# Patient Record
Sex: Male | Born: 1935 | Race: White | Hispanic: No | Marital: Married | Smoking: Never smoker
Health system: Southern US, Community
[De-identification: ages and names within clinical notes are randomized; demographics above are authoritative.]

## PROBLEM LIST (undated history)

## (undated) DIAGNOSIS — R600 Localized edema: Secondary | ICD-10-CM

## (undated) DIAGNOSIS — I2510 Atherosclerotic heart disease of native coronary artery without angina pectoris: Secondary | ICD-10-CM

## (undated) DIAGNOSIS — I714 Abdominal aortic aneurysm, without rupture, unspecified: Secondary | ICD-10-CM

## (undated) DIAGNOSIS — R609 Edema, unspecified: Secondary | ICD-10-CM

## (undated) DIAGNOSIS — N183 Chronic kidney disease, stage 3 unspecified: Secondary | ICD-10-CM

## (undated) DIAGNOSIS — J9600 Acute respiratory failure, unspecified whether with hypoxia or hypercapnia: Secondary | ICD-10-CM

## (undated) DIAGNOSIS — I509 Heart failure, unspecified: Secondary | ICD-10-CM

## (undated) DIAGNOSIS — I442 Atrioventricular block, complete: Secondary | ICD-10-CM

## (undated) DIAGNOSIS — E119 Type 2 diabetes mellitus without complications: Secondary | ICD-10-CM

## (undated) DIAGNOSIS — B029 Zoster without complications: Secondary | ICD-10-CM

## (undated) DIAGNOSIS — I639 Cerebral infarction, unspecified: Secondary | ICD-10-CM

## (undated) DIAGNOSIS — I10 Essential (primary) hypertension: Secondary | ICD-10-CM

## (undated) DIAGNOSIS — Z950 Presence of cardiac pacemaker: Secondary | ICD-10-CM

## (undated) DIAGNOSIS — D649 Anemia, unspecified: Secondary | ICD-10-CM

## (undated) DIAGNOSIS — I219 Acute myocardial infarction, unspecified: Secondary | ICD-10-CM

## (undated) DIAGNOSIS — K219 Gastro-esophageal reflux disease without esophagitis: Secondary | ICD-10-CM

## (undated) DIAGNOSIS — J449 Chronic obstructive pulmonary disease, unspecified: Secondary | ICD-10-CM

## (undated) DIAGNOSIS — M1990 Unspecified osteoarthritis, unspecified site: Secondary | ICD-10-CM

## (undated) DIAGNOSIS — I4891 Unspecified atrial fibrillation: Secondary | ICD-10-CM

## (undated) DIAGNOSIS — J90 Pleural effusion, not elsewhere classified: Secondary | ICD-10-CM

## (undated) DIAGNOSIS — R531 Weakness: Secondary | ICD-10-CM

## (undated) HISTORY — DX: Abdominal aortic aneurysm, without rupture, unspecified: I71.40

## (undated) HISTORY — PX: HERNIA REPAIR: SHX51

## (undated) HISTORY — DX: Abdominal aortic aneurysm, without rupture: I71.4

## (undated) HISTORY — PX: EYE SURGERY: SHX253

---

## 2001-09-08 ENCOUNTER — Ambulatory Visit (HOSPITAL_COMMUNITY): Admission: RE | Admit: 2001-09-08 | Discharge: 2001-09-08 | Payer: Self-pay | Admitting: General Surgery

## 2001-11-16 ENCOUNTER — Ambulatory Visit (HOSPITAL_COMMUNITY): Admission: RE | Admit: 2001-11-16 | Discharge: 2001-11-16 | Payer: Self-pay | Admitting: Family Medicine

## 2001-11-16 ENCOUNTER — Encounter: Payer: Self-pay | Admitting: Family Medicine

## 2003-10-06 ENCOUNTER — Ambulatory Visit (HOSPITAL_COMMUNITY): Admission: RE | Admit: 2003-10-06 | Discharge: 2003-10-06 | Payer: Self-pay | Admitting: General Surgery

## 2004-05-17 ENCOUNTER — Emergency Department (HOSPITAL_COMMUNITY): Admission: EM | Admit: 2004-05-17 | Discharge: 2004-05-17 | Payer: Self-pay | Admitting: Emergency Medicine

## 2004-05-17 ENCOUNTER — Encounter: Payer: Self-pay | Admitting: Cardiovascular Disease

## 2004-05-17 ENCOUNTER — Inpatient Hospital Stay (HOSPITAL_COMMUNITY): Admission: EM | Admit: 2004-05-17 | Discharge: 2004-05-22 | Payer: Self-pay | Admitting: Neurology

## 2004-05-22 ENCOUNTER — Inpatient Hospital Stay (HOSPITAL_COMMUNITY)
Admission: RE | Admit: 2004-05-22 | Discharge: 2004-06-07 | Payer: Self-pay | Admitting: Physical Medicine & Rehabilitation

## 2004-07-12 ENCOUNTER — Encounter (HOSPITAL_COMMUNITY): Admission: RE | Admit: 2004-07-12 | Discharge: 2004-07-26 | Payer: Self-pay | Admitting: Family Medicine

## 2004-07-30 ENCOUNTER — Encounter (HOSPITAL_COMMUNITY): Admission: RE | Admit: 2004-07-30 | Discharge: 2004-08-29 | Payer: Self-pay | Admitting: Family Medicine

## 2004-10-02 ENCOUNTER — Ambulatory Visit (HOSPITAL_COMMUNITY): Admission: RE | Admit: 2004-10-02 | Discharge: 2004-10-02 | Payer: Self-pay | Admitting: Family Medicine

## 2004-10-03 ENCOUNTER — Ambulatory Visit (HOSPITAL_COMMUNITY): Admission: RE | Admit: 2004-10-03 | Discharge: 2004-10-03 | Payer: Self-pay | Admitting: Family Medicine

## 2004-10-04 ENCOUNTER — Ambulatory Visit (HOSPITAL_COMMUNITY): Admission: RE | Admit: 2004-10-04 | Discharge: 2004-10-04 | Payer: Self-pay | Admitting: Family Medicine

## 2004-12-24 ENCOUNTER — Ambulatory Visit (HOSPITAL_COMMUNITY): Admission: RE | Admit: 2004-12-24 | Discharge: 2004-12-24 | Payer: Self-pay | Admitting: Family Medicine

## 2005-01-07 ENCOUNTER — Ambulatory Visit: Payer: Self-pay | Admitting: Cardiovascular Disease

## 2005-01-08 ENCOUNTER — Ambulatory Visit: Payer: Self-pay | Admitting: Cardiovascular Disease

## 2005-01-08 ENCOUNTER — Encounter (HOSPITAL_COMMUNITY): Admission: RE | Admit: 2005-01-08 | Discharge: 2005-02-07 | Payer: Self-pay | Admitting: Cardiovascular Disease

## 2005-01-22 ENCOUNTER — Ambulatory Visit (HOSPITAL_COMMUNITY): Admission: RE | Admit: 2005-01-22 | Discharge: 2005-01-22 | Payer: Self-pay | Admitting: Family Medicine

## 2005-04-23 ENCOUNTER — Ambulatory Visit (HOSPITAL_COMMUNITY): Admission: RE | Admit: 2005-04-23 | Discharge: 2005-04-23 | Payer: Self-pay | Admitting: Family Medicine

## 2005-09-04 ENCOUNTER — Ambulatory Visit: Payer: Self-pay | Admitting: Internal Medicine

## 2005-09-24 ENCOUNTER — Ambulatory Visit: Payer: Self-pay | Admitting: Internal Medicine

## 2005-09-24 ENCOUNTER — Ambulatory Visit (HOSPITAL_COMMUNITY): Admission: RE | Admit: 2005-09-24 | Discharge: 2005-09-24 | Payer: Self-pay | Admitting: Internal Medicine

## 2005-10-30 ENCOUNTER — Ambulatory Visit: Payer: Self-pay | Admitting: Cardiology

## 2005-11-13 ENCOUNTER — Ambulatory Visit: Payer: Self-pay | Admitting: Internal Medicine

## 2006-02-09 ENCOUNTER — Ambulatory Visit: Payer: Self-pay | Admitting: Internal Medicine

## 2006-07-20 ENCOUNTER — Ambulatory Visit (HOSPITAL_COMMUNITY): Admission: RE | Admit: 2006-07-20 | Discharge: 2006-07-20 | Payer: Self-pay | Admitting: Family Medicine

## 2006-08-19 ENCOUNTER — Ambulatory Visit: Payer: Self-pay | Admitting: Internal Medicine

## 2007-01-20 ENCOUNTER — Ambulatory Visit: Payer: Self-pay | Admitting: Internal Medicine

## 2007-06-27 ENCOUNTER — Emergency Department (HOSPITAL_COMMUNITY): Admission: EM | Admit: 2007-06-27 | Discharge: 2007-06-27 | Payer: Self-pay | Admitting: Emergency Medicine

## 2007-11-03 ENCOUNTER — Inpatient Hospital Stay (HOSPITAL_COMMUNITY): Admission: EM | Admit: 2007-11-03 | Discharge: 2007-11-06 | Payer: Self-pay | Admitting: Emergency Medicine

## 2007-11-03 ENCOUNTER — Encounter (INDEPENDENT_AMBULATORY_CARE_PROVIDER_SITE_OTHER): Payer: Self-pay | Admitting: Family Medicine

## 2007-11-30 ENCOUNTER — Encounter (HOSPITAL_COMMUNITY): Admission: RE | Admit: 2007-11-30 | Discharge: 2007-12-30 | Payer: Self-pay | Admitting: Family Medicine

## 2008-01-03 ENCOUNTER — Encounter (HOSPITAL_COMMUNITY): Admission: RE | Admit: 2008-01-03 | Discharge: 2008-02-02 | Payer: Self-pay | Admitting: Family Medicine

## 2008-10-27 DIAGNOSIS — I219 Acute myocardial infarction, unspecified: Secondary | ICD-10-CM

## 2008-10-27 HISTORY — DX: Acute myocardial infarction, unspecified: I21.9

## 2009-04-09 ENCOUNTER — Encounter (HOSPITAL_COMMUNITY): Admission: RE | Admit: 2009-04-09 | Discharge: 2009-05-09 | Payer: Self-pay | Admitting: Internal Medicine

## 2009-04-30 ENCOUNTER — Ambulatory Visit: Payer: Self-pay | Admitting: Internal Medicine

## 2009-04-30 ENCOUNTER — Inpatient Hospital Stay (HOSPITAL_COMMUNITY): Admission: EM | Admit: 2009-04-30 | Discharge: 2009-05-15 | Payer: Self-pay | Admitting: Emergency Medicine

## 2009-05-01 ENCOUNTER — Encounter: Payer: Self-pay | Admitting: Internal Medicine

## 2009-05-08 DIAGNOSIS — Z950 Presence of cardiac pacemaker: Secondary | ICD-10-CM

## 2009-05-08 HISTORY — DX: Presence of cardiac pacemaker: Z95.0

## 2009-05-08 HISTORY — PX: PERMANENT PACEMAKER INSERTION: SHX6023

## 2009-05-15 ENCOUNTER — Inpatient Hospital Stay: Admission: RE | Admit: 2009-05-15 | Discharge: 2009-05-29 | Payer: Self-pay | Admitting: Internal Medicine

## 2009-06-13 HISTORY — PX: NM MYOCAR PERF WALL MOTION: HXRAD629

## 2010-01-28 ENCOUNTER — Ambulatory Visit (HOSPITAL_COMMUNITY): Admission: RE | Admit: 2010-01-28 | Discharge: 2010-01-28 | Payer: Self-pay | Admitting: Family Medicine

## 2010-01-31 ENCOUNTER — Ambulatory Visit (HOSPITAL_COMMUNITY): Admission: RE | Admit: 2010-01-31 | Discharge: 2010-01-31 | Payer: Self-pay | Admitting: Family Medicine

## 2010-05-14 ENCOUNTER — Ambulatory Visit (HOSPITAL_COMMUNITY): Admission: RE | Admit: 2010-05-14 | Discharge: 2010-05-14 | Payer: Self-pay | Admitting: General Surgery

## 2010-11-17 ENCOUNTER — Encounter: Payer: Self-pay | Admitting: Family Medicine

## 2010-11-17 ENCOUNTER — Encounter: Payer: Self-pay | Admitting: Physical Medicine & Rehabilitation

## 2011-01-27 ENCOUNTER — Emergency Department (HOSPITAL_COMMUNITY)
Admission: EM | Admit: 2011-01-27 | Discharge: 2011-01-27 | Disposition: A | Discharge status: Home / Self Care | Payer: Medicare Other | Attending: Emergency Medicine | Admitting: Emergency Medicine

## 2011-01-27 DIAGNOSIS — Z79899 Other long term (current) drug therapy: Secondary | ICD-10-CM | POA: Insufficient documentation

## 2011-01-27 DIAGNOSIS — Z8673 Personal history of transient ischemic attack (TIA), and cerebral infarction without residual deficits: Secondary | ICD-10-CM | POA: Insufficient documentation

## 2011-01-27 DIAGNOSIS — I10 Essential (primary) hypertension: Secondary | ICD-10-CM | POA: Insufficient documentation

## 2011-01-27 DIAGNOSIS — Z950 Presence of cardiac pacemaker: Secondary | ICD-10-CM | POA: Insufficient documentation

## 2011-01-27 DIAGNOSIS — K219 Gastro-esophageal reflux disease without esophagitis: Secondary | ICD-10-CM | POA: Insufficient documentation

## 2011-01-27 DIAGNOSIS — Z7982 Long term (current) use of aspirin: Secondary | ICD-10-CM | POA: Insufficient documentation

## 2011-01-27 DIAGNOSIS — I252 Old myocardial infarction: Secondary | ICD-10-CM | POA: Insufficient documentation

## 2011-02-02 LAB — COMPREHENSIVE METABOLIC PANEL
ALT: 16 U/L (ref 0–53)
ALT: 17 U/L (ref 0–53)
ALT: 20 U/L (ref 0–53)
ALT: 27 U/L (ref 0–53)
AST: 25 U/L (ref 0–37)
AST: 23 U/L (ref 0–37)
AST: 23 U/L (ref 0–37)
AST: 28 U/L (ref 0–37)
AST: 35 U/L (ref 0–37)
Albumin: 2.5 g/dL — ABNORMAL LOW (ref 3.5–5.2)
Albumin: 2.5 g/dL — ABNORMAL LOW (ref 3.5–5.2)
Albumin: 2.6 g/dL — ABNORMAL LOW (ref 3.5–5.2)
Alkaline Phosphatase: 66 U/L (ref 39–117)
Alkaline Phosphatase: 67 U/L (ref 39–117)
Alkaline Phosphatase: 76 U/L (ref 39–117)
BUN: 50 mg/dL — ABNORMAL HIGH (ref 6–23)
BUN: 63 mg/dL — ABNORMAL HIGH (ref 6–23)
BUN: 76 mg/dL — ABNORMAL HIGH (ref 6–23)
BUN: 93 mg/dL — ABNORMAL HIGH (ref 6–23)
CO2: 28 mEq/L (ref 19–32)
CO2: 23 mEq/L (ref 19–32)
CO2: 23 mEq/L (ref 19–32)
CO2: 26 mEq/L (ref 19–32)
Calcium: 8.1 mg/dL — ABNORMAL LOW (ref 8.4–10.5)
Calcium: 8.2 mg/dL — ABNORMAL LOW (ref 8.4–10.5)
Calcium: 8.3 mg/dL — ABNORMAL LOW (ref 8.4–10.5)
Calcium: 8.8 mg/dL (ref 8.4–10.5)
Chloride: 101 mEq/L (ref 96–112)
Chloride: 103 mEq/L (ref 96–112)
Chloride: 96 mEq/L (ref 96–112)
Chloride: 97 mEq/L (ref 96–112)
Creatinine, Ser: 1.8 mg/dL — ABNORMAL HIGH (ref 0.4–1.5)
Creatinine, Ser: 2.29 mg/dL — ABNORMAL HIGH (ref 0.4–1.5)
Creatinine, Ser: 3.18 mg/dL — ABNORMAL HIGH (ref 0.4–1.5)
Creatinine, Ser: 3.45 mg/dL — ABNORMAL HIGH (ref 0.4–1.5)
Creatinine, Ser: 3.62 mg/dL — ABNORMAL HIGH (ref 0.4–1.5)
GFR calc Af Amer: 19 mL/min — ABNORMAL LOW (ref >60)
GFR calc Af Amer: 20 mL/min — ABNORMAL LOW (ref >60)
GFR calc Af Amer: 22 mL/min — ABNORMAL LOW (ref >60)
GFR calc Af Amer: 33 mL/min — ABNORMAL LOW (ref >60)
GFR calc non Af Amer: 36 mL/min — ABNORMAL LOW (ref >60)
GFR calc non Af Amer: 16 mL/min — ABNORMAL LOW (ref >60)
GFR calc non Af Amer: 17 mL/min — ABNORMAL LOW (ref >60)
GFR calc non Af Amer: 19 mL/min — ABNORMAL LOW (ref >60)
Glucose, Bld: 116 mg/dL — ABNORMAL HIGH (ref 70–99)
Glucose, Bld: 119 mg/dL — ABNORMAL HIGH (ref 70–99)
Glucose, Bld: 130 mg/dL — ABNORMAL HIGH (ref 70–99)
Potassium: 3.7 mEq/L (ref 3.5–5.1)
Potassium: 3.9 mEq/L (ref 3.5–5.1)
Potassium: 4 mEq/L (ref 3.5–5.1)
Sodium: 132 mEq/L — ABNORMAL LOW (ref 135–145)
Sodium: 135 mEq/L (ref 135–145)
Sodium: 136 mEq/L (ref 135–145)
Sodium: 136 mEq/L (ref 135–145)
Total Bilirubin: 0.2 mg/dL — ABNORMAL LOW (ref 0.3–1.2)
Total Bilirubin: 0.7 mg/dL (ref 0.3–1.2)
Total Bilirubin: 0.9 mg/dL (ref 0.3–1.2)
Total Bilirubin: 1 mg/dL (ref 0.3–1.2)
Total Protein: 5.9 g/dL — ABNORMAL LOW (ref 6.0–8.3)
Total Protein: 6 g/dL (ref 6.0–8.3)
Total Protein: 6 g/dL (ref 6.0–8.3)
Total Protein: 6.4 g/dL (ref 6.0–8.3)

## 2011-02-02 LAB — UIFE/LIGHT CHAINS/TP QN, 24-HR UR
Albumin, U: DETECTED
Free Kappa Lt Chains,Ur: 2.09 mg/dL — ABNORMAL HIGH (ref 0.04–1.51)
Free Lambda Excretion/Day: 6.63 mg/d
Free Lambda Lt Chains,Ur: 0.34 mg/dL (ref 0.08–1.01)
Gamma Globulin, Urine: DETECTED — AB
Time: 24 hours
Total Protein, Urine-Ur/day: 72 mg/d (ref 10–140)
Volume, Urine: 1950 mL

## 2011-02-02 LAB — CBC
HCT: 31 % — ABNORMAL LOW (ref 39.0–52.0)
HCT: 29.9 % — ABNORMAL LOW (ref 39.0–52.0)
HCT: 31.6 % — ABNORMAL LOW (ref 39.0–52.0)
HCT: 32.7 % — ABNORMAL LOW (ref 39.0–52.0)
HCT: 33 % — ABNORMAL LOW (ref 39.0–52.0)
HCT: 33.1 % — ABNORMAL LOW (ref 39.0–52.0)
HCT: 34.3 % — ABNORMAL LOW (ref 39.0–52.0)
HCT: 34.3 % — ABNORMAL LOW (ref 39.0–52.0)
HCT: 36.1 % — ABNORMAL LOW (ref 39.0–52.0)
Hemoglobin: 10.5 g/dL — ABNORMAL LOW (ref 13.0–17.0)
Hemoglobin: 10.8 g/dL — ABNORMAL LOW (ref 13.0–17.0)
Hemoglobin: 11.1 g/dL — ABNORMAL LOW (ref 13.0–17.0)
Hemoglobin: 11.2 g/dL — ABNORMAL LOW (ref 13.0–17.0)
Hemoglobin: 11.4 g/dL — ABNORMAL LOW (ref 13.0–17.0)
Hemoglobin: 11.7 g/dL — ABNORMAL LOW (ref 13.0–17.0)
Hemoglobin: 11.8 g/dL — ABNORMAL LOW (ref 13.0–17.0)
Hemoglobin: 12.1 g/dL — ABNORMAL LOW (ref 13.0–17.0)
Hemoglobin: 12.3 g/dL — ABNORMAL LOW (ref 13.0–17.0)
MCHC: 33.8 g/dL (ref 30.0–36.0)
MCHC: 34.1 g/dL (ref 30.0–36.0)
MCHC: 34.2 g/dL (ref 30.0–36.0)
MCHC: 34.3 g/dL (ref 30.0–36.0)
MCHC: 34.4 g/dL (ref 30.0–36.0)
MCHC: 34.5 g/dL (ref 30.0–36.0)
MCV: 93.9 fL (ref 78.0–100.0)
MCV: 92.5 fL (ref 78.0–100.0)
MCV: 92.9 fL (ref 78.0–100.0)
MCV: 92.9 fL (ref 78.0–100.0)
MCV: 92.9 fL (ref 78.0–100.0)
MCV: 93.1 fL (ref 78.0–100.0)
MCV: 93.4 fL (ref 78.0–100.0)
MCV: 93.5 fL (ref 78.0–100.0)
Platelets: 162 10*3/uL (ref 150–400)
Platelets: 163 10*3/uL (ref 150–400)
Platelets: 181 10*3/uL (ref 150–400)
Platelets: 183 10*3/uL (ref 150–400)
Platelets: 199 10*3/uL (ref 150–400)
Platelets: 206 10*3/uL (ref 150–400)
Platelets: 219 10*3/uL (ref 150–400)
RBC: 3.3 MIL/uL — ABNORMAL LOW (ref 4.22–5.81)
RBC: 3.39 MIL/uL — ABNORMAL LOW (ref 4.22–5.81)
RBC: 3.51 MIL/uL — ABNORMAL LOW (ref 4.22–5.81)
RBC: 3.55 MIL/uL — ABNORMAL LOW (ref 4.22–5.81)
RBC: 3.56 MIL/uL — ABNORMAL LOW (ref 4.22–5.81)
RBC: 3.67 MIL/uL — ABNORMAL LOW (ref 4.22–5.81)
RBC: 3.68 MIL/uL — ABNORMAL LOW (ref 4.22–5.81)
RBC: 3.9 MIL/uL — ABNORMAL LOW (ref 4.22–5.81)
RDW: 13.2 % (ref 11.5–15.5)
RDW: 13.4 % (ref 11.5–15.5)
RDW: 13.4 % (ref 11.5–15.5)
RDW: 13.5 % (ref 11.5–15.5)
RDW: 13.6 % (ref 11.5–15.5)
RDW: 13.6 % (ref 11.5–15.5)
RDW: 13.7 % (ref 11.5–15.5)
WBC: 7 10*3/uL (ref 4.0–10.5)
WBC: 13.7 10*3/uL — ABNORMAL HIGH (ref 4.0–10.5)
WBC: 6.8 10*3/uL (ref 4.0–10.5)
WBC: 7.3 10*3/uL (ref 4.0–10.5)
WBC: 7.8 10*3/uL (ref 4.0–10.5)
WBC: 8.3 10*3/uL (ref 4.0–10.5)
WBC: 8.4 10*3/uL (ref 4.0–10.5)
WBC: 8.4 10*3/uL (ref 4.0–10.5)
WBC: 9.6 10*3/uL (ref 4.0–10.5)

## 2011-02-02 LAB — DIFFERENTIAL
Basophils Relative: 0 % (ref 0–1)
Eosinophils Absolute: 0.1 10*3/uL (ref 0.0–0.7)
Eosinophils Relative: 1 % (ref 0–5)
Monocytes Absolute: 1.1 10*3/uL — ABNORMAL HIGH (ref 0.1–1.0)
Monocytes Relative: 16 % — ABNORMAL HIGH (ref 3–12)
Neutrophils Relative %: 63 % (ref 43–77)

## 2011-02-02 LAB — HEPARIN LEVEL (UNFRACTIONATED)
Heparin Unfractionated: 0.13 IU/mL — ABNORMAL LOW (ref 0.30–0.70)
Heparin Unfractionated: 0.31 IU/mL (ref 0.30–0.70)
Heparin Unfractionated: 0.43 IU/mL (ref 0.30–0.70)
Heparin Unfractionated: 0.43 IU/mL (ref 0.30–0.70)
Heparin Unfractionated: 0.47 IU/mL (ref 0.30–0.70)
Heparin Unfractionated: <0.1 IU/mL — ABNORMAL LOW (ref 0.30–0.70)

## 2011-02-02 LAB — RENAL FUNCTION PANEL
Albumin: 2.7 g/dL — ABNORMAL LOW (ref 3.5–5.2)
BUN: 94 mg/dL — ABNORMAL HIGH (ref 6–23)
CO2: 29 mEq/L (ref 19–32)
CO2: 30 mEq/L (ref 19–32)
CO2: 30 mEq/L (ref 19–32)
CO2: 31 mEq/L (ref 19–32)
Calcium: 8.9 mg/dL (ref 8.4–10.5)
Calcium: 9 mg/dL (ref 8.4–10.5)
Calcium: 9.1 mg/dL (ref 8.4–10.5)
Chloride: 102 mEq/L (ref 96–112)
Chloride: 91 mEq/L — ABNORMAL LOW (ref 96–112)
Chloride: 92 mEq/L — ABNORMAL LOW (ref 96–112)
Chloride: 92 mEq/L — ABNORMAL LOW (ref 96–112)
Creatinine, Ser: 2.46 mg/dL — ABNORMAL HIGH (ref 0.4–1.5)
GFR calc Af Amer: 27 mL/min — ABNORMAL LOW (ref >60)
GFR calc Af Amer: 36 mL/min — ABNORMAL LOW (ref >60)
GFR calc non Af Amer: 23 mL/min — ABNORMAL LOW (ref >60)
GFR calc non Af Amer: 30 mL/min — ABNORMAL LOW (ref >60)
Glucose, Bld: 119 mg/dL — ABNORMAL HIGH (ref 70–99)
Glucose, Bld: 123 mg/dL — ABNORMAL HIGH (ref 70–99)
Glucose, Bld: 127 mg/dL — ABNORMAL HIGH (ref 70–99)
Glucose, Bld: 128 mg/dL — ABNORMAL HIGH (ref 70–99)
Phosphorus: 6.3 mg/dL — ABNORMAL HIGH (ref 2.3–4.6)
Potassium: 3.6 mEq/L (ref 3.5–5.1)
Potassium: 3.6 mEq/L (ref 3.5–5.1)
Potassium: 3.9 mEq/L (ref 3.5–5.1)
Potassium: 4.1 mEq/L (ref 3.5–5.1)
Sodium: 137 mEq/L (ref 135–145)
Sodium: 137 mEq/L (ref 135–145)
Sodium: 138 mEq/L (ref 135–145)
Sodium: 139 mEq/L (ref 135–145)

## 2011-02-02 LAB — GLUCOSE, CAPILLARY
Glucose-Capillary: 114 mg/dL — ABNORMAL HIGH (ref 70–99)
Glucose-Capillary: 121 mg/dL — ABNORMAL HIGH (ref 70–99)
Glucose-Capillary: 125 mg/dL — ABNORMAL HIGH (ref 70–99)
Glucose-Capillary: 130 mg/dL — ABNORMAL HIGH (ref 70–99)
Glucose-Capillary: 132 mg/dL — ABNORMAL HIGH (ref 70–99)
Glucose-Capillary: 135 mg/dL — ABNORMAL HIGH (ref 70–99)
Glucose-Capillary: 135 mg/dL — ABNORMAL HIGH (ref 70–99)
Glucose-Capillary: 137 mg/dL — ABNORMAL HIGH (ref 70–99)
Glucose-Capillary: 160 mg/dL — ABNORMAL HIGH (ref 70–99)

## 2011-02-02 LAB — PROTIME-INR
INR: 1.1 (ref 0.00–1.49)
Prothrombin Time: 14.6 seconds (ref 11.6–15.2)

## 2011-02-02 LAB — URINALYSIS, ROUTINE W REFLEX MICROSCOPIC
Bilirubin Urine: NEGATIVE
Ketones, ur: NEGATIVE mg/dL
Nitrite: NEGATIVE
Protein, ur: NEGATIVE mg/dL
Urobilinogen, UA: 0.2 mg/dL (ref 0.0–1.0)

## 2011-02-02 LAB — TROPONIN I: Troponin I: 6.17 ng/mL (ref 0.00–0.06)

## 2011-02-02 LAB — POCT CARDIAC MARKERS
CKMB, poc: 1.4 ng/mL (ref 1.0–8.0)
Myoglobin, poc: 149 ng/mL (ref 12–200)
Myoglobin, poc: 191 ng/mL (ref 12–200)
Troponin i, poc: <0.05 ng/mL (ref 0.00–0.09)
Troponin i, poc: <0.05 ng/mL (ref 0.00–0.09)

## 2011-02-02 LAB — BLOOD GAS, ARTERIAL
Drawn by: 24610
FIO2: 0.4 %
pCO2 arterial: 34.3 mmHg — ABNORMAL LOW (ref 35.0–45.0)
pH, Arterial: 7.393 (ref 7.350–7.450)
pO2, Arterial: 57 mmHg — ABNORMAL LOW (ref 80.0–100.0)

## 2011-02-02 LAB — BASIC METABOLIC PANEL
BUN: 45 mg/dL — ABNORMAL HIGH (ref 6–23)
BUN: 53 mg/dL — ABNORMAL HIGH (ref 6–23)
CO2: 27 mEq/L (ref 19–32)
CO2: 28 mEq/L (ref 19–32)
Calcium: 8.2 mg/dL — ABNORMAL LOW (ref 8.4–10.5)
Calcium: 8.7 mg/dL (ref 8.4–10.5)
Chloride: 100 mEq/L (ref 96–112)
Creatinine, Ser: 2.32 mg/dL — ABNORMAL HIGH (ref 0.4–1.5)
Creatinine, Ser: 2.65 mg/dL — ABNORMAL HIGH (ref 0.4–1.5)
GFR calc Af Amer: 28 mL/min — ABNORMAL LOW (ref >60)
GFR calc Af Amer: 32 mL/min — ABNORMAL LOW (ref >60)
GFR calc non Af Amer: 20 mL/min — ABNORMAL LOW (ref >60)
Glucose, Bld: 155 mg/dL — ABNORMAL HIGH (ref 70–99)
Glucose, Bld: 164 mg/dL — ABNORMAL HIGH (ref 70–99)
Potassium: 4.3 mEq/L (ref 3.5–5.1)
Sodium: 135 mEq/L (ref 135–145)

## 2011-02-02 LAB — CARDIAC PANEL(CRET KIN+CKTOT+MB+TROPI)
CK, MB: 39 ng/mL — ABNORMAL HIGH (ref 0.3–4.0)
CK, MB: 7.7 ng/mL — ABNORMAL HIGH (ref 0.3–4.0)
CK, MB: 8 ng/mL — ABNORMAL HIGH (ref 0.3–4.0)
Relative Index: 4.5 — ABNORMAL HIGH (ref 0.0–2.5)
Relative Index: 9.6 — ABNORMAL HIGH (ref 0.0–2.5)
Relative Index: INVALID (ref 0.0–2.5)
Total CK: 146 U/L (ref 7–232)
Total CK: 164 U/L (ref 7–232)
Total CK: 406 U/L — ABNORMAL HIGH (ref 7–232)
Troponin I: 13.27 ng/mL (ref 0.00–0.06)
Troponin I: 2.13 ng/mL (ref 0.00–0.06)
Troponin I: 2.2 ng/mL (ref 0.00–0.06)

## 2011-02-02 LAB — PROTEIN ELECTROPHORESIS, SERUM
Albumin ELP: 45.8 % — ABNORMAL LOW (ref 55.8–66.1)
Alpha-1-Globulin: 10 % — ABNORMAL HIGH (ref 2.9–4.9)
Alpha-2-Globulin: 16.3 % — ABNORMAL HIGH (ref 7.1–11.8)
Beta 2: 8.5 % — ABNORMAL HIGH (ref 3.2–6.5)
Beta Globulin: 7.3 % — ABNORMAL HIGH (ref 4.7–7.2)
Gamma Globulin: 12.1 % (ref 11.1–18.8)
M-Spike, %: NOT DETECTED g/dL
Total Protein ELP: 5.6 g/dL — ABNORMAL LOW (ref 6.0–8.3)

## 2011-02-02 LAB — CK TOTAL AND CKMB (NOT AT ARMC)
CK, MB: 26.7 ng/mL — ABNORMAL HIGH (ref 0.3–4.0)
Relative Index: 10.1 — ABNORMAL HIGH (ref 0.0–2.5)
Relative Index: INVALID (ref 0.0–2.5)
Total CK: 265 U/L — ABNORMAL HIGH (ref 7–232)
Total CK: 53 U/L (ref 7–232)

## 2011-02-02 LAB — BRAIN NATRIURETIC PEPTIDE
Pro B Natriuretic peptide (BNP): 1069 pg/mL — ABNORMAL HIGH (ref 0.0–100.0)
Pro B Natriuretic peptide (BNP): 1477 pg/mL — ABNORMAL HIGH (ref 0.0–100.0)
Pro B Natriuretic peptide (BNP): 2041 pg/mL — ABNORMAL HIGH (ref 0.0–100.0)
Pro B Natriuretic peptide (BNP): 2533 pg/mL — ABNORMAL HIGH (ref 0.0–100.0)
Pro B Natriuretic peptide (BNP): 744 pg/mL — ABNORMAL HIGH (ref 0.0–100.0)

## 2011-02-02 LAB — MAGNESIUM: Magnesium: 2.1 mg/dL (ref 1.5–2.5)

## 2011-03-11 NOTE — Discharge Summary (Signed)
NAME:  [name], [name] NO.:  [id number]   MEDICAL RECORD NO.:  [id number]          PATIENT TYPE:  INP   LOCATION:  3708                         FACILITY:  [facility]   PHYSICIAN:  Ritta Slot, MD     DATE OF BIRTH:  1919-05-05   DATE OF ADMISSION:  04/30/2009  DATE OF DISCHARGE:  05/15/2009                               DISCHARGE SUMMARY   ADDENDUM:  He had a non ST-elevation myocardial infarction.  He was  medically treated secondary to his acute on chronic kidney failure.  He  was also not placed on an ACE or an ARB secondary to his elevated  creatinine.      Lezlie Octave, N.P.      Ritta Slot, MD  Electronically Signed    BB/MEDQ  D:  05/15/2009  T:  05/16/2009  Job:  [id number]   cc:   Madelin Rear. Sherwood Gambler, MD

## 2011-03-11 NOTE — Discharge Summary (Signed)
NAME:  [name], [name] NO.:  [id number]   MEDICAL RECORD NO.:  [id number]          PATIENT TYPE:  INP   LOCATION:  A226                          FACILITY:  [facility]   PHYSICIAN:  Skeet Latch, DO    DATE OF BIRTH:  05/27/1919   DATE OF ADMISSION:  11/03/2007  DATE OF DISCHARGE:  01/10/2009LH                               DISCHARGE SUMMARY   DISCHARGE DIAGNOSES:  1. Acute nonhemorrhagic right pontine infarct.  2. Acute renal insufficiency.  3. Asymptomatic left vertebral stenosis.  4. Hypertension.  5. Episodes of bradycardia.   BRIEF HOSPITAL COURSE:  [name] is an 77 year old Caucasian male who  presented with weakness.  The patient stated approximately 2 days prior  to admission he started experiencing stroke like symptoms.  The patient  started to notice that his left side upper and lower started to become  weaker than normal.  The patient did have previous strokes in the past  and has had some mild weakness on his left side.  The patient described  the weakness as a heaviness type feeling.  No major slurring of speech  or altered mental status changes were noted.  The patient did admit to  some ataxia and decreased grasp function of his left hand during this  episode.  These symptoms seemed to slowly improve since the patient has  been in the hospital.  The patient did not experience any major deficits  on the left side.  He has been doing fairly well.  The patient did have  a CT scan performed in the emergency room which showed inflammatory  changes in the left maxillary sinus.  1.  MRI of his brain however  showed acute nonhemorrhagic small right pontine infarct.  2.  Old  infarct small vessel disease type changes and areas of blood breakdown  products in addition to underlying atrophy.  3.  Nasal sinus mucosal  thickening most notable in the left maxillary sinus.  The patient also  had carotid Doppler performed which showed hemodynamically significant  carotid stenosis was not identified of the neck.  Estimated bilateral,  less than 50% ICA stenosis based on velocity criteria.  Moderate plaque  at the left carotid bifurcation with milder atherosclerotic disease at  the right carotid bifurcation.  On November 04, 2007, the patient had a  chest x-ray which showed cardiomegaly without edema and also had an MRA  of his head performed which showed:  1.  Findings could reflect partial  to complete thick occlusion of distal left vertebral artery due to  severe atherosclerotic disease or remote dissection.  2.  Diffuse  intracranial atherosclerosis.  3.  Moderate to severe stenosis involved  in the right MCA trifurcation and left MCA posterior sylvian division.  4.  Mild to moderate stenosis involving the right supraclinoid ICA,  bilateral proximal posterior cerebral arteries, proximal basilar artery  and left MCA trifurcation.  Secondary to the patient's improved symptoms  and being cleared by neurology feel patient is stable enough to be  discharged to home.  He was discharged on the following medications:  DISCHARGE MEDICATIONS:  1. Tenex 2 mg daily.  2. Clonidine 0.2 mg twice a day.  3. Aspirin 81 mg p.o. daily.   DISCHARGE VITALS:  Temperature is 97.5, pulse 51, respirations 20, blood  pressure 130/78.  The patient is satting 96%.   LABS:  TSH 1.437, hemoglobin A1c was 6.1.  Lipid panel - cholesterol  212, triglycerides 114, HDL 23, LDL 166, creatinine kinase is 70, CK-MB  1.9, troponin 0.04, sodium 140, potassium 4.2, chloride is 105, CO2 25,  glucose 103, BUN 21, creatinine 1.77.   DISPOSITION:  The patient will be discharged to home.   CONDITION AT DISCHARGE:  Is stable.   CONSULTANTS:  1. Neurology.  2. Cardiology secondary to bradycardia.   INSTRUCTIONS:  Diet.  The patient will maintain a low-sodium, heart-  healthy diet.   ACTIVITY:  The patient is to increase his activity slowly.  The patient  probably will need  outpatient physical therapy if not already ordered by  his primary care physician.   FOLLOWUP:  The patient is to return to Dr. Nobie Putnam in approximately 1  week and to see [facility] in 2-4 weeks.  The patient is  instructed to return to the emergency room if any major stroke type  symptoms.  I will defer the patient being on Avalide to his primary care  physician secondary to his renal insufficiency.  Clonidine probably can  be increased if needed.      Skeet Latch, DO  Electronically Signed     SM/MEDQ  D:  11/06/2007  T:  11/06/2007  Job:  [id number]   cc:   Dani Gobble, MD  Fax: [phone]   Kofi A. Gerilyn Pilgrim, M.D.  Fax: [phone]   Patrica Duel, M.D.  Fax: [phone]

## 2011-03-11 NOTE — Cardiovascular Report (Signed)
NAME:  [name], [name] NO.:  [id number]   MEDICAL RECORD NO.:  [id number]          PATIENT TYPE:  INP   LOCATION:  2905                         FACILITY:  [facility]   PHYSICIAN:  Nicki Guadalajara, M.D.     DATE OF BIRTH:  [birth year]   DATE OF PROCEDURE:  DATE OF DISCHARGE:                            CARDIAC CATHETERIZATION   PROCEDURE:  Temporary transvenous pacemaker.   INDICATIONS:  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  He has subsequently ruled in for myocardial infarction.  He has had CHF,  as well as symptomatic bradycardia with rates down into the 30s to 40s.  He subsequently has developed worsening renal function with creatinine  3.0, troponin 13, CK 406.  He had been seen earlier and today by Dr.  Clarene Duke who felt the patient would require temporary pacemaker.  He also  suggested the possibility of considering a catheterization study, states  there was something very culprit as a cause for his infarct.  However,  the patient is still felt to be in CHF with BMP was greater than 2000  and has required BiPAP.  He is now brought to the catheterization  laboratory for insertion of a temporary venous pacemaker with plans for  continued medical stabilization of his CHF and ultimately once stable,  consideration for possible right and left heart catheterizations.   PROCEDURE:  The patient was prepped and draped in the usual fashion.  Right femoral vein was punctured anteriorly after local anesthetic with  lidocaine was administered.  A 6-French venous sheath was inserted.  A 5-  [place] transvenous pacemaker was then advanced under fluoroscopic  guidance to the right ventricular apex.  Excellent capture was obtained  with excellent thresholds.  The pacemaker will be set at a rate of 50  with an MA of six.  The patient tolerated the procedure well.   IMPRESSION:  Successful insertion of  a temporary transvenous pacemaker  via the right femoral vein to the right ventricular apex.           ______________________________  Nicki Guadalajara, M.D.     TK/MEDQ  D:  05/02/2009  T:  05/03/2009  Job:  [id number]   cc:   Antonieta Iba, MD  Madelin Rear. Sherwood Gambler, MD  Patrica Duel, M.D.

## 2011-03-11 NOTE — Procedures (Signed)
NAME:  CHARLS, CUSTER NO.:  [id number]   MEDICAL RECORD NO.:  [id number]          PATIENT TYPE:  INP   LOCATION:  A226                          FACILITY:  [facility]   PHYSICIAN:  Dani Gobble, MD       DATE OF BIRTH:  1919/10/16   DATE OF PROCEDURE:  DATE OF DISCHARGE:                                ECHOCARDIOGRAM   REFERRING:  Hospitalist team, Dr. Elige Radon and myself.   INDICATION:  An 77 year old gentleman here with a history of stroke,  referred to evaluate presumably for embolic source.   Technologic study is reasonable, but certainly not adequate to  definitively exclude a clot or embolic source.   The aorta measures normally at 2.8 cm.   The left atrium is mildly dilated and measured 4.3 cm.  The patient did  appear to be in sinus rhythm during this procedure.   The interventricular septum and posterior wall are moderately thickened  measuring 1.6 cm and 1.5 cm respectively.   The aortic valve is trileaflet, moderately thickened and calcified.  There is a mild decrease in leaflet excursion, but overall opening is  quite reasonable.  No aortic insufficiency is noted.   DOPPLER INTERROGATION:  Aortic valve reveals a peak velocity of 2.8  meters per second corresponding to a peak gradient of 15 mmHg and a mean  gradient of 9 mmHg.   The mitral valve may be just mildly thickened, but without limitation of  leaflet excursion.  No mitral valve prolapse noted.  Mild mitral  regurgitation is noted.  Doppler interrogation of mitral valve is within  normal limits.   The pulmonic valve is not well visualized.   Tricuspid valve also is poorly visualized, but appeared to be grossly  structurally normal with mild tricuspid regurgitation noted.   Left ventricle subjectively is normal in size.  Overall left systolic  function is normal and no regional wall motion abnormalities are noted.   The right atrium and right ventricle appeared grossly to be normal in  size with normal right ventricular systolic function.  The presence of  diastolic dysfunction is inferred from pulse wave Doppler across the  mitral valve.   IMPRESSION:  1. Mild left atrial enlargement.  2. Moderate concentric left ventricular hypertrophy.  3. Trivial aortic stenosis.  4. Mild mitral regurgitation.  5. Moderate mitral annular calcification.  6. Mild tricuspid regurgitation.  7. Normal left ventricular size and systolic function without regional      wall motion abnormality noted.  8. The presence of diastolic dysfunction is inferred from pulse wave      Doppler across the mitral valve.  9. No obvious embolic source is identified on this study, but I cannot      exclude the possibility on this study.  10.As compared to prior study in 2005, there does not appear to be a      significant change.           ______________________________  Dani Gobble, MD     AB/MEDQ  D:  11/04/2007  T:  11/04/2007  Job:  [id number]  cc:   Elige Radon, MD   Dani Gobble, MD  Fax: [phone]

## 2011-03-11 NOTE — Discharge Summary (Signed)
NAME:  [name], [name] NO.:  [id number]   MEDICAL RECORD NO.:  [id number]          PATIENT TYPE:  INP   LOCATION:  3708                         FACILITY:  [facility]   PHYSICIAN:  Ritta Slot, MD     DATE OF BIRTH:  May 03, 1919   DATE OF ADMISSION:  04/30/2009  DATE OF DISCHARGE:  05/15/2009                               DISCHARGE SUMMARY   DISCHARGE DIAGNOSES:  1. Complete heart block.  2. Permanent pacemaker placement, Medtronic Adapta, implanted by Dr.      Ritta Slot on 05/08/2009.  He had a Medtronic Adapta ADDRL1,      serial number [id number], and 2 Medtronic leads.  3. Normal ejection fraction of 50-55% by echocardiogram May 01, 2009.  4. An ST elevation myocardial infarction.  He was medically treated      secondary to his elevated creatinine when he came.  5. Acute on chronic renal failure.  6. Chronic kidney disease stage III, creatinine baseline is apparently      about 1.7.  7. Diastolic congestive heart failure, acute on chronic.  8. Mild aortic stenosis with a peak gradient of 20 mmHg.  9. Hypertension.  10.Debilitation.  11.Gastroesophageal reflux disease.  12.Anemia, unknown type.  It is normocytic.  His iron stores were not      tested during this hospitalization.  On admission his hemoglobin      was 11.8, hematocrit 34.3.  Could be related to his pacemaker      insertion versus chronic with his renal dysfunction.  We will      prophylactically add iron x2 months until his hemoglobin      stabilizes.   DISCHARGE MEDICATIONS:  1. Aspirin 81 mg a day.  2. Protonix 40 mg a day.  3. Bystolic 10 mg a day.  4. Nu-Iron 150 mg twice a day.  5. He can also have Tylenol 325-650 every 6 hours p.r.n.  6. He can have MiraLax 17 grams daily p.r.n.  7. He should be on Colace 200 mg once a day.  8. Robitussin DM 15 mL per day.   LABORATORY DATA:  July 19, BNP was 336.  Sodium 136, potassium 4.2,  chloride 101, CO2 28, glucose 97, BUN 50, creatinine  1.80.  Hemoglobin  10.5, hematocrit 31, platelet count 424 and WBC 7.0.  BNP on July 5 was  532; July 7 was 2533; May 03, 2256; May 04, 2040; May 05, 1476; July  12, 922; and July 19, 336.  Cardiac enzymes:  Cardiac marker x2 was  negative.  CPK-MB 53/2.2, troponin of 0.06; number 2, 52/4.7, troponin  of 0.22; number 3, 164/19, troponin of 2.13; number 4, 265/26.7,  troponin of 6.17; number 5, 406/39, troponin of 13.27; number 6,  177/8.0, troponin of 4.95; number 7, 146/7.7, troponin of 2.20.  Admission BUN and creatinine was 47/2.32.  It elevated up to 76/3.62 on  May 04, 2009 and then 93/3.45 on July 10.  It then began to decline and  at time of discharge it is at near his baseline at 50/1.80.  He had a  protein  electrophoresis done which showed a total protein urine of 72.  He also had a serum protein electrophoresis, total protein was 5.6.  Chest x-ray July 5 showed bibasilar atelectasis, no edema.  July 6  showed bilateral interstitial and airspace opacities and pulmonary  edema.  July 9 he had a small pleural effusion, diffuse bilateral air  space infiltrates.  Last x-ray July 14 showed permanent pacemaker, no  pneumothorax.  He had an ultrasound of his kidneys.  His right kidney  measured 9.6, left 11.3.  Both kidneys showed increased renal cortical  echogenicity, no evidence for hydronephrosis.  There was a borderline  abdominal aortic aneurysm and a small right pleural effusion.   He should continue activity restrictions for his pacemaker.  He should  not reach or stretch with the arm on the affected pacemaker side.  He  should do no lifting on that side.  These restrictions should stay in  place for approximately a 5-month to 6-week period of time.  His  pacemaker site can be washed gently with soap and water and pat it dry,  and Steri-Strips are not to be removed.  If they fall off, that is okay,  but do not actively remove.   HOSPITAL COURSE:  [name] went to see his  primary care doctor  because he was feeling well in [name], [place].  He was  having near-syncope and shortness of breath.  He was found to be in  bradycardia and he was sent by EMS to [facility].  He was found  to be in complete heart block.  He was admitted.  It was decided he  needed to undergo a pacemaker implantation.  He did have an  echocardiogram performed on May 01, 2009, which showed a normal EF of 50-  55%.  Peak gradient of his aortic valve was 20 mmHg.  He had mild-to-  moderate MR.  It was noted that he had acute renal failure.  He had to  be placed on a BiPAP.  He was placed on dobutamine because of his heart  failure.  His rate came up to 60.  He was going to have a cardiac  catheterization on May 02, 2009.  However, he was in heart failure and  his creatinine was elevated.  It was decided that he would just have a  temporary pacemaker inserted.  This was performed by Dr. Nicki Guadalajara.  Please see his note for complete details.  Renal consult was called and  he was followed by renal service during his hospitalization.  They  adjusted his Lasix.  His creatinine started to improve.  A pacemaker was  implanted on May 08, 2009, by Dr. Ritta Slot.  He had a Medtronic  Adapta ADDRL1.  Post implantation his condition continued to improve.  He was seen by PT and OT.  He was eventually moved to telemetry.  It was  felt that he would need short-term rehab at a nursing home.  As an  outpatient he will have a Lexiscan Myoview when he has recuperated from  his pacemaker implantation.  He will follow up with Dr. Ritta Slot.  An appointment will be made prior to his discharge for him to be seen in  approximately 1 week for a wound check.  His creatinine stabilized and  the renal doctors signed off on May 10, 2009.      Lezlie Octave, N.P.      Ritta Slot, MD  Electronically Signed  BB/MEDQ  D:  05/15/2009  T:  05/15/2009  Job:  [id number]   cc:    Duke Salvia. Eliott Nine, M.D.  Madelin Rear. Sherwood Gambler, MD

## 2011-03-11 NOTE — Group Therapy Note (Signed)
NAME:  [name], [name] NO.:  [id number]   MEDICAL RECORD NO.:  [id number]          PATIENT TYPE:  INP   LOCATION:  A226                          FACILITY:  [facility]   PHYSICIAN:  Skeet Latch, DO    DATE OF BIRTH:  July 19, 1919   DATE OF PROCEDURE:  11/05/2007  DATE OF DISCHARGE:                                 PROGRESS NOTE   SUBJECTIVE:  Mr. Swingle state he is doing fine today.  The patient has no  complaints.  Denies any headache, chest pain, abdominal pain or  genitourinary complaints today.  The patient denies no focal weakness on  his side, seems to be stable at this time.   OBJECTIVE:  VITAL SIGNS:  Temperature 97.7, pulse 53, respirations 22,  blood pressure 160/99, saturating 98% on 2 liters.  CARDIOVASCULAR:  Regular rate and rhythm.  No murmurs, rubs or gallops.  LUNGS:  Clear to auscultation bilaterally.  No wheezes, rales or  rhonchi.  ABDOMEN:  Soft, nontender, nondistended.  No JVD or guarding.  EXTREMITIES:  No cyanosis, clubbing or edema.   LABORATORY DATA:  Sodium 137, potassium 4.0, chloride 105, CO2 27,  glucose 103, BUN 24, creatinine 1.69.  White count 5.4, hemoglobin 11.8,  hematocrit 34.9, platelets 219.   RADIOLOGICAL STUDIES:  Carotid Dopplers showed no significant carotid  artery stenosis, estimated bilateral left end 50% stenosis.  Moderate  plaque and left carotid bifurcation with mild or atherosclerotic disease  at the right carotid bifurcation.   ASSESSMENT/PLAN:  1. Acute nonhemorrhagic right pontine infarct.  Neurology saw the      patient and has ordered a brain MRA.  The patient will now be on      aspirin 81 mg daily.  He seems to be stable at this time.  Pending      MRA results.  Do not anticipate the patient having a long hospital      stay.  The patient probably will need continued physical therapy as      an outpatient.  2. Acute renal insufficiency.  Seems to be improving.  Will continue      IV hydration.  Will  continue to follow his BUN and creatinine.  3. Hypertension.  Will put patient on Catapres and try to keep his      blood pressure below 160-170 range at this time.  The patient is      asymptomatic and does not complain of headaches at this time.  4. Episodes of bradycardia.  The patient is asymptomatic, may have      sick sinus syndrome and probably will need outpatient follow up      with the cardiologist pending at discharge.     Skeet Latch, DO  Electronically Signed    SM/MEDQ  D:  11/05/2007  T:  11/05/2007  Job:  [id number]

## 2011-03-11 NOTE — Consult Note (Signed)
NAME:  [name], [name] NO.:  [id number]   MEDICAL RECORD NO.:  [id number]          PATIENT TYPE:  INP   LOCATION:  2905                         FACILITY:  [facility]   PHYSICIAN:  Cecille Aver, M.D.DATE OF BIRTH:  26-Jun-1919   DATE OF CONSULTATION:  05/04/2009  DATE OF DISCHARGE:                                 CONSULTATION   REQUESTING PHYSICIAN:  Nicki Guadalajara, MD, with [facility].   REASON FOR CONSULTATION:  Acute-on-chronic renal failure.   HISTORY OF PRESENT ILLNESS:  [name] is an 77 year old white male, who  is a retired [employer] investment banker with past medical history significant for  hypertension, history of CVA and CKD, creatinine noted to be 1.7 in  January 2009, which indicates a GFR of 35-40.  He presented to the  hospital on July 5 with complaints of near-syncope, was noted to be a  primary AV block.  Hospitalization was complicated by chest pain,  hypotension, and ruling in for an MI.  He had been on an ARB, which was  continued until July 7.  Presenting creatinine on July 5 was 2.3, is now  slowly worsened up to 3.6.  Urine output has been marginal, but  adequate.  Hospitalization also complicated by worsening volume overload  and increased BNP.  He also had a temporary pacing wire placed during  hospitalization.  The patient is now dyspneic with wife at bedside.   PAST MEDICAL HISTORY:  1. Hypertension, was on ARB.  2. History of CVA.  3. Mild aortic stenosis with decreased ejection fraction at 40%,      although that echo was obtained prior to his chemical evidence for      an MI.  4. History of CKD.   CURRENT MEDICATIONS:  1. Baby aspirin 1 a day.  2. Lasix 160 mg q.12 h. started today.  3. Protonix 40 mg daily.  4. Dobutamine drip.  5. Previous Benicar, which was stopped on July 7.   ALLERGIES:  No known drug allergies.   SOCIAL HISTORY:  He lives at home with wife.  He was previously  independent with his ADLs.   FAMILY HISTORY:  Negative for renal disease.   REVIEW OF SYSTEMS:  Positive for dyspnea, although he feels that it is  better.  He denies chest pain, nausea, vomiting, edema, fevers, chills,  diarrhea, or constipation.  Also of note, nursing was unable to get a  Foley catheter in.  Review of systems is negative.   PHYSICAL EXAMINATION:  VITAL SIGNS:  The patient is afebrile, blood  pressure 81/57, heart rate 81, respirations 23, oxygen saturation 93% on  6 L.  He is on to dobutamine at 5 mcg.  I's and O's yesterday were 2272  and 1100.  Urine output has been 375 so far today.  GENERAL:  The patient is elderly talkative with an increased respiratory  rate.  HEENT:  Pupils are equal, round, and reactive to light.  Extraocular  motions are intact.  Mucous membranes moist.  NECK:  There is jugular venous distention.  LUNGS:  Decreased breath sounds at  the bases.  CARDIOVASCULAR:  Regular rate and rhythm.  ABDOMEN:  Positive bowel sounds, soft, and nontender.  EXTREMITIES:  No significant edema.   LABORATORIES:  CK peaked at 406 and troponin peaked at 13.  Potassium  3.7 and bicarb 23.  BUN and creatinine 76 and 3.6.  Albumin 2.5.  BNP  2041.  Hemoglobin 11.2.   ASSESSMENT:  An 77 year old white male with acute-on-chronic renal  failure in the setting of myocardial infarction and decreased blood  pressure.   1. Renal:  Worsening renal function since admit was having decreased      blood pressure, on ARBs, which was likely contributing.  He also      continues to have low blood pressure and decreased perfusion.  He      may also have an element of obstruction given the nursing inability      to place a Foley catheter.  We will check UA, ultrasound, and SPEP.      Attempt again to place Foley catheter and if they are unable, may      need Urology input.  Unfortunately, the patient is very close to be      dialysis requiring at this time and I have explained that to the      patient and  his wife.  There is no absolute indication for dialysis      right now.  We will desperately try to increase his perfusion and I      have spoken with Dr. Tresa Endo.  We will try some low-dose dopamine,      but there may not be a lot of good options here because his cardiac      function may be worse and what the echo prior to his MI showed.  2. Volume:  It is okay to continue Lasix for now.  Hopefully with      increased perfusion, urine output will pick up.   This is very tenuous situation.  We will follow closely.           ______________________________  Cecille Aver, M.D.     KAG/MEDQ  D:  05/04/2009  T:  05/05/2009  Job:  [id number]

## 2011-03-11 NOTE — Consult Note (Signed)
NAME:  [name], [name] NO.:  [id number]   MEDICAL RECORD NO.:  [id number]          PATIENT TYPE:  INP   LOCATION:  A226                          FACILITY:  [facility]   PHYSICIAN:  Kofi A. Gerilyn Pilgrim, M.D. DATE OF BIRTH:  03-01-1919   DATE OF CONSULTATION:  DATE OF DISCHARGE:                                 CONSULTATION   HISTORY OF PRESENT ILLNESS:  The patient is an 77 year old white male  who presents with acute left hemiparesis. The patient, upon awakening,  noted that the left side was weaker than baseline. The patient indicates  that he has had 2 cerebrovascular events in the past, one in 1998 which  was a blend, non-hemorrhagic ischemic event that resulted in left side  hemiparesis and the other was a hemorrhagic infarct in 2005. He,  fortunately, had significant improvement with both events with only mild  residual problems. The patient was not taking any form of anti-platelet  medications. He apparnetly had some gastroesophageal reflux disease  symptoms about a year ago and was taken off aspirin before endoscopy.  This was not restarted afterwards. The patient apparently waited a  couple of days before presenting to the hospital with the current  symptoms. His symptoms have not gotten any worse.   PAST MEDICAL HISTORY:  1. Right hemorrhagic infarct 2005.  2. Right ischemic infarct in 1998.  3. Hypertension.  4. Vertigo.   ALLERGIES:  NO KNOWN DRUG ALLERGIES.   SOCIAL HISTORY:  He lives with his wife. No alcoholic, tobacco, or  illicit drug use. His wife is allergic to Plavix and therefore, he has  been very reluctant to try this.   ADMISSION MEDICATIONS:  Avalide 120/12.5 daily, multivitamins, Relafen,  Anacel, Tenex 2 mg daily, and Zegerid 40 mg daily.   REVIEW OF SYSTEMS:  Reduced appetite. No fevers. No headache.   PHYSICAL EXAMINATION:  GENERAL: This is a thin, very pleasant gentleman  in no acute distress.  VITAL SIGNS:  Temperature 97.5, pulse 60,  respiratory rate 18, blood  pressure 156/98.  HEENT:  Normocephalic and atraumatic.  ABDOMEN:  Soft.  EXTREMITIES:  No significant edema.  NEUROLOGIC:  Awake and alert. He converses well. Speech is normal.  Language cognition normal. Strength intact. Cranial nerve evaluation,  pupils are 4 mm and reactive. Visual fields are intact. Extraocular  muscles intact. Facial muscle strength is symmetric. Tongue is midline.  Shoulder shrug is slightly impaired on the left side. Motor examination  shows a left upper extremity monoparesis 4/5 with associated drift.  Coordination shows no dysmetria or tremors. There is no Parkinsonism.  Reflexes are brisk in the left upper extremity and left leg. They are  somewhat reduced in the right upper extremity. Reflexes are equivocal.  Sensation is normal to temperature and light touch.   LABORATORY DATA:  MRI is reviewed in person and shows evidence of a  cortical base infarct/encephalomalacia involving the right occipital  parietal area. There is also focal atrophy noted in the bi-parietal  regions. There is an acute infarct noted in the right mid brain. There  is what appears to  be a watershed infarct, old, involving the left  anterior frontal area between the [facility] and PC distributions. There is  scattered subcortical white matter ischemic changes that is chronic.   ASSESSMENT:  1. Acute mid brain infarct with respect to his hypertension, age, and      previous infarct.  2. Gastroesophageal reflux disease.   RECOMMENDATIONS:  I would switch the aspirin to 81 mg given the  gastroesophageal reflux disease. I will also recommend a MRA. The  patient has had carotid testing and this shows no hemodynamic  significant stenosis. Echocardiography is also unrevealing for any clot.  He does have concentric left ventricular hypertrophy but normal left  ventricular function.      Kofi A. Gerilyn Pilgrim, M.D.  Electronically Signed     KAD/MEDQ  D:  11/04/2007   T:  11/05/2007  Job:  [id number]

## 2011-03-11 NOTE — H&P (Signed)
NAME:  [name], MANIACI NO.:  [id number]   MEDICAL RECORD NO.:  [id number]          PATIENT TYPE:  INP   LOCATION:  A226                          FACILITY:  [facility]   PHYSICIAN:  Skeet Latch, DO    DATE OF BIRTH:  1919/08/07   DATE OF ADMISSION:  11/03/2007  DATE OF DISCHARGE:  LH                              HISTORY & PHYSICAL   PRIMARY CARE PHYSICIAN:  Patrica Duel, M.D.   CHIEF COMPLAINT:  Weakness.   HISTORY OF PRESENT ILLNESS:  This is an 77 year old, Caucasian male who  presents with chief complaint of weakness.  The patient states  approximately 2 days prior in the night, he started experiencing stroke  type symptoms.  The patient started complaining of increasing weakness  on his left side, upper and lower extremities.  The patient does have  history of previous strokes in the past and has had some neurological  deficits of weakness on his left side.  The patient states that he did  not immediately seek medical care because he has had these symptoms in  the past, but they have progressively gotten worse and he decided he  would go to his primary care physician's office for evaluation.  The  patient was seen his primary care physician's office and told to come to  emergency room for evaluation.  The patient describes a weakness in his  left upper and lower extremity.  The patient denies any slurring of  speech, altered mental status changes or any tingling on his left side.  The patient does admit to some ataxia, decreased grasp and function of  his left hand.  As stated, the patient does have a prior history of CVA  in the past and also has a history of hypertension and vertigo.   PAST MEDICAL HISTORY:  1. Hypertension.  2. CVA.  3. Vertigo.   PAST SURGICAL HISTORY:  Bilateral hernia repair.   SOCIAL HISTORY:  Denies any smoking, alcohol, illicit drug use.   ALLERGIES:  NO KNOWN DRUG ALLERGIES.   HOME MEDICATIONS:  1. Tenex 2 mg once a day.  2. Avalide 120/12.5 mg daily.  3. Advil one tablet as needed.  4. Zegerid 40 mg once a day.  5. Nembutal once a day.  6. Multiple vitamins.   REVIEW OF SYSTEMS:  CONSTITUTIONAL:  Positive for weakness, slight  decrease in appetite, no chills or fever.  HEENT:  Unremarkable.  CARDIOVASCULAR:  No chest pain or palpitations.  RESPIRATORY:  No cough,  dyspnea, wheezing.  GI: No nausea, vomiting, abdominal pain or diarrhea.  GENITOURINARY:  No dysuria, urgency or frequency.  MUSCULOSKELETAL:  No  back pain, myalgias, neck pain.  SKIN:  No rashes or major bruising is  noted.  NEUROLOGIC:  Does have some weakness.  No headaches or altered  mental status, lightheadedness, positive for weakness of left side.   PHYSICAL EXAMINATION:  GENERAL:  The patient is alert and oriented x3.  Does not appear in any acute distress.  He is pleasant and cooperative.  VITAL SIGNS:  Temperature is 97.5, pulse 46, respirations 18, blood  pressure 158/99, saturating 95% on room air.  HEENT:  PERRL.  EOMI.  Head atraumatic, normocephalic.  NECK:  Soft.  Nontender, nondistended.  No JVD.  No thyromegaly.  LUNGS:  Clear to auscultation bilaterally.  No rales, rhonchi or  wheezing.  ABDOMEN:  Soft, nontender, nondistended.  No rigidity or guarding.  Positive bowel sounds.  No rebound tenderness.  CARDIOVASCULAR:  Distant  S1, S2 regular.  No murmurs appreciated.  EXTREMITIES:  No clubbing, cyanosis or edema.  NEUROLOGIC:  The patient does have some focal deficits in his left upper  and lower extremity.  Decreased grasp of left upper extremity.  Does  have some weakness with plantar and dorsiflexion of his foot.  Not  grossly abnormal compared to his right side.  The patient has no  slurring of speech.  He is alert and oriented x3, pleasant, cooperative.   LABORATORY DATA AND X-RAY FINDINGS:  EKG shows sinus arrhythmia with  bradycardia, first-degree AV block, left axis deviation, nonspecific ST  and T-wave  changes.  MRI of his brain showed acute, right, pontine,  hemorrhagic stroke.  Old infarct small vessel disease type changes in  the areas of blood breakdown products in addition to underlying atrophy.  Paranasal sinus mucosal thickening most notably only involving the left  maxillary sinus.  CT of his head done without contrast showed no acute  intracranial pathology.  Inflammatory changes in left maxillary sinus.   Sodium 136, potassium 4.0, chloride is 100, CO2 29, glucose 121, BUN 25,  creatinine 1.74.  White count 5.7, hemoglobin 12.6, hematocrit 37.3,  platelets 229.  PT 13.2, INR 1.0, PTT 37.   ASSESSMENT/PLAN:  1. Acute nonhemorrhagic right pontine infarct.  The patient has been      placed on an aspirin daily.  This will be continued at this time.      I do not appreciate any stroke at this time.  The patient seems to      be stable with no obvious major deficits at this time.  I will get      a neurology consult at this time.  The patient be kept n.p.o. until      he has his speech evaluation.  Bilateral carotid ultrasounds have      also been ordered.  2. Acute renal insufficiency.  The patient will be placed on IV      hydration at this time.  He had a repeat BUN and creatinine in the      a.m.  Depending on improvement of his renal function, the patient      may need nephrology consult.  3. Will get a 2-D echocardiogram to rule out any cardiovascular      disease.  The patient is having some bradycardic type episodes.      Will get a cardiology consult while patient is in house and at this      time to rule out any cardiac disease at this time.  4. The patient will be placed on deep venous thrombosis as well as      gastrointestinal prophylaxis and will be followed carefully.  The      patient will resume his home medications at this time for blood      pressure.      Skeet Latch, DO  Electronically Signed    SM/MEDQ  D:  11/04/2007  T:  11/04/2007  Job:   [id number]

## 2011-03-14 NOTE — Op Note (Signed)
NAME:  [name], [name] NO.:  [id number]   MEDICAL RECORD NO.:  [id number]          PATIENT TYPE:  AMB   LOCATION:  DAY                           FACILITY:  [facility]   PHYSICIAN:  Lionel December, M.D.    DATE OF BIRTH:  09-Jan-1919   DATE OF PROCEDURE:  09/24/2005  DATE OF DISCHARGE:                                 OPERATIVE REPORT   PROCEDURE:  Esophagogastroduodenoscopy with placement of Bravo device for pH  study.   INDICATION:  [name] is an 77 year old Caucasian male with recurrent chest pain  whose noninvasive cardiac evaluation was negative and has not responded to  PPI. He has been off Nexium for two weeks, and he states he is having less  pain but has not completely gone away. He had GI study in March showing  small sliding hiatal hernia with Schatzki's ring and mild duodenitis but no  evidence of esophagitis. He is undergoing diagnostic EGD. If he has no  endoscopic evidence of esophagitis, Bravo device will be placed for pH study  to determine whether or not he is having chest pain due to acid reflux.   Procedure and risks were reviewed with the patient, and informed consent was  obtained.   PREMEDICATION:  Cetacaine spray for pharyngeal topical anesthesia, Demerol  25 mg, IV Versed 2 mg IV.   FINDINGS:  Procedure performed in endoscopy suite. The patient's vital signs  and O2 saturation were monitored during the procedure and remained Castillo.  The patient was placed in left lateral position, and Olympus videoscope was  passed via oropharynx without any difficulty into esophagus.   Esophagus. Mucosa of the esophagus was normal throughout. GE junction was at  41 cm from the incisors with noncritical Schatzki's ring. There was a small  sliding hiatal hernia. Hiatus was 44 cm.   Stomach. It was empty and distended very well with insufflation. Folds of  proximal stomach were normal. Examination of mucosa at body, antrum, pyloric  channel as well as angularis,  fundus and cardia was normal.   Duodenum. Mucosa of the bulb revealed patchy erythema and edema and scar  towards anterior wall distally. Scope was passed to the second part of  duodenum where mucosa and folds were normal. Endoscope was withdrawn.   We proceeded with placement of Bravo device. It was calibrated. Delivery  system or catheter was already loaded on with a Bravo device and was  advanced via oropharynx without any difficulty into esophagus to 35 cm from  the incisors. It was connected to suction apparatus for 30 seconds. The  plunger was pushed to secure the Bravo device to the esophageal mucosa which  was then turned clockwise to disengage and was removed. Endoscope was passed  again and Bravo device in good position. Endoscope was withdrawn. The  patient tolerated the procedure well.   FINAL DIAGNOSIS:  1.  Small sliding hiatal hernia without endoscopic evidence of esophagitis.  2.  Noncritical Schatzki's ring and bulbar duodenitis with a scar.   RECOMMENDATIONS:  1.  Standard instructions given. He will return to this facility in 48  hours.  2.  H pylori serology will be checked today given duodenal findings and      remote history of PUD.      Lionel December, M.D.  Electronically Signed     NR/MEDQ  D:  09/24/2005  T:  09/25/2005  Job:  [id number]   cc:   Patrica Duel, M.D.  Fax: [phone]

## 2011-03-14 NOTE — Group Therapy Note (Signed)
NAME:  KALON, ERHARDT NO.:  [id number]   MEDICAL RECORD NO.:  [id number]          PATIENT TYPE:  INP   LOCATION:  A226                          FACILITY:  [facility]   PHYSICIAN:  Skeet Latch, DO    DATE OF BIRTH:  01-21-1919   DATE OF PROCEDURE:  11/03/2006  DATE OF DISCHARGE:                                 PROGRESS NOTE   SUBJECTIVE:  Mr. Tulloch seems to be doing well this a.m.  The patient is  not complaining of any pain or increasing numbness or tingling or any  associated symptoms to his stroke.  The patient doing well.  Questioning  to eat at this time.   OBJECTIVE:  VITAL SIGNS:  Today temperature is 97.5, pulse 46,  respirations 18, blood pressure 158/99, the patient satting 95% on room  air.  GENERAL:  The patient awake, alert, oriented x3.  Looks stated age and  in no acute distress.  NEUROVASCULAR:  Distant S1/S2, regular, no murmurs appreciated.  LUNGS:  Clear to auscultation bilaterally. No rales, rhonchi or wheezes.  ABDOMEN: Soft, nontender, nondistended.  Positive bowel sounds.  EXTREMITIES:  No clubbing, cyanosis or edema.  NEUROLOGIC:  The patient still has slight decreased strength on his left  upper and lower extremity, decreased grasp reflex left hand.  The  patient did experience some of this from his prior stroke.  Seems to be  a little bit more increased.  No obvious signs of dysarthria or slurring  of speech at this time.   LABORATORY DATA:  All labs are pending at this time.   ASSESSMENT AND PLAN:  1. __________ infarct.  The patient will continue to be on aspirin      daily, __________ .  The patient will receive a PT/OT evaluation as      well as speech therapy evaluation. After speech therapy evaluation      the patient will be placed on diet.  The patient seems to be stable      at this time.  Do not anticipate the patient having a long hospital      stay unless there is a condition __________ .  Will continue with      current  care at this time.  2. Hypertension.  The patient is on home blood pressure medicines      __________ at this time.  Cautiously follow his blood pressure, we      do not want it to drop very quickly so will constantly follow.      Will await __________ and continue to follow-up as above.      Skeet Latch, DO  Electronically Signed     SM/MEDQ  D:  11/04/2007  T:  11/04/2007  Job:  [id number]

## 2011-03-14 NOTE — Op Note (Signed)
NAME:  EPIC, TRIBBETT NO.:  [id number]   MEDICAL RECORD NO.:  [id number]          PATIENT TYPE:  AMB   LOCATION:  DAY                           FACILITY:  [facility]   PHYSICIAN:  Lionel December, M.D.    DATE OF BIRTH:  Oct 22, 1919   DATE OF PROCEDURE:  DATE OF DISCHARGE:  09/24/2005                                 OPERATIVE REPORT   INDICATIONS:  Mr. Lipari is an 77 year old Caucasian male with recurrent  episodes of chest pain felt to be noncardiac. It is felt that he may be  refluxing. However, he has not had any improvement with PPI and anti-reflux  measures. He underwent EGD two days ago which revealed sliding hiatal hernia  and gastritis. Bravo device was placed to find out if chest pain is  secondary to esophageal acid exposure.   Please note that this study was performed off acid suppression.   FINDINGS:  Study duration is 39 hours.   Day one analysis:  He had 41 episodes of reflux, 17 of which occurred in  upright position and 24 supine.   Number of reflux longer than 5 minutes was 1 occurring into supine position.  Next, duration of longest reflux episode was 16 minutes. Total time pH less  than four was 33 minutes.   Date two analysis:  Number of reflux episodes 17, and all of these occurred  in upright position.   Number of recent reflux episodes greater than 5 minutes was 3. Next,  duration of longest reflux was 10 minutes occurring in upright position.  Total time pH less than 4 was 36 minutes.   Two-day analysis:  Total number of reflux episodes was 58.   Number of reflux episodes greater than 5 minutes was 4. Next, duration of  longest reflux episode was 16 minutes. Total time pH below 4 was 69 minutes.   Fraction time pH less than 4 was 2.9%.   The patient reported no episode of chest pain.   IMPRESSION:  Mr. Quant has esophageal acid exposure which is within  physiological grange. He did not report even a single episode of chest pain.   Therefore, this study does not help in evaluation of his chest pain.   RECOMMENDATIONS:  The patient will be placed on another PPI. He will pick up  samples of Aciphex 20 mg p.o. q.a.m.   If chest pain recurs, he will need to be reevaluated by his cardiologist.      Lionel December, M.D.  Electronically Signed     NR/MEDQ  D:  10/09/2005  T:  10/09/2005  Job:  [id number]   cc:   Patrica Duel, M.D.  Fax: [phone]

## 2011-03-14 NOTE — Procedures (Signed)
NAME:  [name], [name] NO.:  [id number]   MEDICAL RECORD NO.:  [id number]          PATIENT TYPE:  OUT   LOCATION:  RAD                           FACILITY:  [facility]   PHYSICIAN:  Dani Gobble, MD       DATE OF BIRTH:  1919/08/18   DATE OF PROCEDURE:  10/04/2004  DATE OF DISCHARGE:                                  ECHOCARDIOGRAM   REFERRING PHYSICIAN:  Patrica Duel, M.D.   INDICATIONS FOR PROCEDURE:  [name] is an 77 year old gentleman with a  stroke about six months ago.   The technical quality of the study is somewhat limited secondary to  patient's body habitus.   The aorta is within normal limits at 3.2 cm.   The left atrium is mildly to moderately dilated.  The patient appeared to be  in sinus rhythm during this procedure.  No obvious clots or masses were  appreciated.   The intraventricular septum and posterior wall were both moderately  thickened, consistent with moderate concentric left ventricular hypertrophy.   The aortic valve appeared to be trileaflet and was at least moderately  calcified, but with continued good leaflet excursion.  No significant aortic  insufficiency was noted.  Doppler interrogation of the aortic valve revealed  a peak velocity of 1.6 meters per second, corresponding to a peak gradient  of 11 mmHg and a mean gradient of 6 mmHg, and Doppler of 1.9 sq/cm.   The mitral valve appeared mildly thickened, but with normal leaflet  excursion.  No mitral valve prolapse was noted.  Mild to moderate mitral  regurgitation was noted.  Mild to moderate mitral annular calcification was  noted.  Doppler interrogation of the mitral valve was within normal limits.   The pulmonic valve was incompletely visualized.   The tricuspid valve was grossly structurally normal.  Mild tricuspid  regurgitation was noted.  _____________Hypertension was not suggested by  this study.   The left ventricle was normal in size with the LVIDD measuring at 4.2  cm,  and the LVISD measured at 2.9 cm.  Overall left ventricular systolic  function was normal and no regional wall motion abnormalities were noted.  The presence of diastolic dysfunction is inferred from pulsar Doppler across  the mitral valve.   The right atrium is mildly dilated while the right ventricle is normal in  size with normal right ventricular systolic function.  The right atrium is  also notable for QRA network which is a normal variant.   IMPRESSION:  1.  Moderate left atrial enlargement.  2.  Moderate concentric left ventricular hypertrophy.  3.  Moderate aortic sclerosis without significant stenosis.  4.  Mild mitral annular calcification.  5.  Mild to moderate mitral regurgitation.  6.  Mild tricuspid regurgitation.  7.  Normal left ventricular size and systolic function without regional wall      motion abnormality noted.  8.  Presence of diastolic dysfunction is inferred from pulsatile Doppler      across the mitral valve.  9.  Right atrial enlargement with QRA network (normal variant).  AB/MEDQ  D:  10/04/2004  T:  10/05/2004  Job:  [id number]   cc:   Patrica Duel, M.D.  [address], [address]  [place]  [place] [address]  Fax: [phone]

## 2011-03-14 NOTE — Discharge Summary (Signed)
NAME:  [name], [name] NO.:  [id number]   MEDICAL RECORD NO.:  [id number]                   PATIENT TYPE:  INP   LOCATION:  3038                                 FACILITY:  [facility]   PHYSICIAN:  Pramod P. Pearlean Brownie, MD                 DATE OF BIRTH:  1918/11/24   DATE OF ADMISSION:  05/17/2004  DATE OF DISCHARGE:  05/22/2004                                 DISCHARGE SUMMARY   DISCHARGE DIAGNOSES:  1. Right thalamic hemorrhage, hypertensive.  2. Hypertension.  3. History of stroke in 1998.  4. Anxiety.   DISCHARGE MEDICATIONS:  1. Altace 5 mg daily.  2. Tenax 2 mg daily.   STUDIES PERFORMED:  1. CT scan of the brain on the day of admission done at [facility]     revealed a 1.5 x 1.2 right basal ganglia lateral thalamic hematoma, most     likely of hypertensive etiology, no old infarction seen, moderate degree     of generalized supratentorial subcortical atrophy noted.  No     intraventricular extension or hydrocephalus or mass effect seen.  2. A 24 hour CT scan followup shows no significant change.  3. MRI at 48 hours with some progressive somnolence shows stable right     thalamic and upper mid-brain hemorrhagic infarct.  Cerebral atrophy and     old right frontoparietal infarct.  Previous sinusitis.  4. Carotid Doppler normal.  5. Transthoracic echocardiogram shows ejection fraction of 55% to 65% with     some LV wall thickness, but no wall movement abnormalities.  Aortic valve     was mildly calcified.  Mild aortic root dilatation.  Mild mitral annular     calcification.  No obvious embolic source.  6. EKG done at [facility] showed sinus arrhythmia, left axis     deviation, early transition, consider LVH.   LABORATORY DATA:  Chemistry with glucose of 153, otherwise normal.  CBC  normal.  Hemoglobin A1C normal.  Homocystine normal.  Cholesterol 176,  triglycerides 86, HDL 35, LDL 124.   HISTORY OF PRESENT ILLNESS:  [name] is  an 77 year old right-handed white  male who was working in his yard the morning of admission who developed  sudden onset of dysequilibrium, left-sided weakness, slurred speech, and  right frontal headache at 9:15 a.m.  Symptoms were persistent, he was taken  to the emergency room at [facility] where a non-CT scan of the head  revealed small right basilar ganglia hemorrhage.  The family requested that  he be transported to [facility] for further evaluation.  He does  have a history of stroke in 1998.  He was on aspirin for stroke prevention  after this.  He was admitted to [facility] for further workup.   HOSPITAL COURSE:  Followup serial CT scans showed stable hemorrhage.  Etiology was felt to be hypertensive.  It was felt that this was a primary  hypertensive hemorrhage.  Tenax was ordered in addition to Altace for  increased blood pressure control in the hospital.  Blood pressure did  improve 139/85, on discharge back to 160/91.  Will need followup of this as  an outpatient.  Anticipate that it will go down with resolution of the  stroke.  PT, OT, and speech therapy all followed the patient during  admission and recommended continued therapy on inpatient rehab.  The patient  qualified and he was transferred on May 22, 2004.   CONDITION ON DISCHARGE:  Alert and oriented x3.  Speech dysarthric.  No  aphasia.  He has some mild left facial weakness and left upper extremity  3/5, with left grip and fingers weak, and left lower extremity 4/5.  He has  some ataxia on his left side.  He has no sensory loss.  His chest is clear.  His heart rate is regular.   DISCHARGE PLAN:  1. Transfer to rehab for continuation of therapies.  2. Aspirin for secondary stroke prevention four to six weeks after initial     hemorrhage.  3. New statin for LDL of 124.  Goal LDL of less than 100.  Will need     followup in six weeks.  4. Follow up with primary care physician after  discharge from rehab.  5. Follow up with Dr. Delia Heady two to three months after discharge from     rehab.      Annie Main, N.P.                         Pramod P. Pearlean Brownie, MD    SB/MEDQ  D:  05/22/2004  T:  05/22/2004  Job:  [id number]   cc:   [name], M.D. [facility]   Patrica Duel, M.D.  [address], [address]  [place]  [place] [address]  Fax: [phone]

## 2011-03-14 NOTE — H&P (Signed)
NAME:  [name], [name] NO.:  [id number]   MEDICAL RECORD NO.:  [id number]                  PATIENT TYPE:   LOCATION:                                       FACILITY:   PHYSICIAN:  Dalia Heading, M.D.               DATE OF BIRTH:  01/15/1919   DATE OF ADMISSION:  DATE OF DISCHARGE:                                HISTORY & PHYSICAL   CHIEF COMPLAINT:  Right inguinal hernia.   HISTORY OF PRESENT ILLNESS:  The patient is an 77 year old white male who is  referred for evaluation and treatment of a right inguinal hernia.  It has  been present for some time but is now causing him more discomfort.  It is  made worse with straining.   PAST MEDICAL HISTORY:  Includes a CVA in the past, hypertension.   PAST SURGICAL HISTORY:  1. Left inguinal herniorrhaphy in the remote past.  2. Colonoscopy in 2002.   CURRENT MEDICATIONS:  Altace, Tenex, aspirin - which he is holding.   ALLERGIES:  No known drug allergies.   REVIEW OF SYSTEMS:  The patient denies drinking or smoking.  Denies any  recent chest pain, shortness of breath, leg swelling, diabetes mellitus, or  bleeding disorder.   PHYSICAL EXAMINATION:  GENERAL:  The patient is a well-developed, well-  nourished white male in no acute distress.  VITAL SIGNS:  He is afebrile and vital signs are stable.  LUNGS:  Clear to auscultation with equal breath sounds bilaterally.  HEART:  Reveals a regular rate and rhythm without S3, S4, or murmurs.  ABDOMEN:  Soft, nontender, nondistended.  No hepatosplenomegaly or masses  are noted.  A reducible right inguinal hernia is noted.  GENITOURINARY:  Within normal limits.   IMPRESSION:  Right inguinal hernia.   PLAN:  The patient is scheduled for right inguinal herniorrhaphy on October 06, 2003.  The risks and benefits of the procedure including bleeding,  infection, and recurrence of the hernia were fully explained to the patient  who gave informed consent.     ___________________________________________                                         Dalia Heading, M.D.   MAJ/MEDQ  D:  10/03/2003  T:  10/03/2003  Job:  [id number]   cc:   Patrica Duel, M.D.  [address]  [place]  [place] [address]  Fax: [phone]

## 2011-03-14 NOTE — Consult Note (Signed)
NAME:  [name], [name] NO.:  [id number]   MEDICAL RECORD NO.:  [id number]                   PATIENT TYPE:  INP   LOCATION:  3113                                 FACILITY:  [facility]   PHYSICIAN:  Pramod P. Pearlean Brownie, MD                 DATE OF BIRTH:  [birth year]   DATE OF CONSULTATION:  DATE OF DISCHARGE:                                   CONSULTATION   REASON FOR CONSULTATION:  Hemorrhage.   HISTORY OF PRESENT ILLNESS:  [name] is a 77 year old gentleman who was  working in the yard this morning at 9:15 a.m.  He developed sudden onset of  dysequilibrium, left-sided weakness, slurred speech, and right frontal  headache.  His symptoms persisted and then he was taken to the emergency  room at [facility] where a noncontrast CAT scan of the head  revealed small right basal ganglion hemorrhage.  Family requested that the  patient be moved to [facility] for further evaluation.  The patient denies any loss of vision, trouble swallowing or vision  problems.   His past medical history is significant for right hemisphere infarction  seven years ago which he was hospitalized at [facility].  He  had left-sided weakness and incoordination for a couple of months which  gradually improved completely back to his baseline.  He was started on  aspirin for stroke prevention following this.   PAST MEDICAL HISTORY:  1. Hypertension.  2. Anxiety.   MEDICATIONS:  1. Altace 5 mg q.day.  2. Aspirin 81 mg q.day.  3. Tylenol arthritis p.r.n.  4. Xanax p.r.n.   ALLERGIES:  None.   PAST SURGICAL HISTORY:  None.   SOCIAL HISTORY:  The patient is married.  Lives with his wife and does not  smoke or drink alcohol.   REVIEW OF SYMPTOMS:  No significant for recent fever, loss of weight, cough,  diarrhea, chest pain, or weakness.   PHYSICAL EXAMINATION:  GENERAL:  A pleasant, elderly gentleman who is lying  comfortably in bed.  VITAL SIGNS:  He is afebrile.  Pulse rate is 70.  He is 56 per minute and  regular with occasional PVCs.  Blood pressure 116/96.  PULSES:  Distal pulses are well felt.  HEENT:  Nontraumatic.  ENT exam is unremarkable.  NECK:  Supple without bruit.  CARDIAC:  No murmur or gallop.  LUNGS:  Clear to auscultation.  NEUROLOGICAL:  The patient is awake, alert, __________, and drowsy but open  eyes easily and follows commands well.  He is oriented x 3.  There is no  aphasia.  There is mild dysarthria.  He can follow three-step commands.  Pupils are equal and reactive to light and accommodation.  Visual acuity and  fields are adequate.  Face is symmetric with weakness of the lower half of  the face on the left.  Tongue is  midline.  Palate elevates normally.  Motor  system exam reveals significant left upper extremity drift.  He has 2/5  strength in elbow and shoulders with minimum 1/5 strength in the left wrist  and 0/5 in the fingers.  Left lower extremity strength is 4-/5.  Deep tendon  reflexes are 2+ on the left and 1+ on the right.  Left plantar is equal and  the right is downgoing.  He has intact touch pinprick sensation.  Coordination is impaired on the left.  Gait was not tested.   LABORATORY DATA:  Noncontrast CAT scan on the head done today at [facility] reveals approximately 1.5 by 1.2 cm right basal ganglion/lateral  thalamic hematoma most likely of hypertensive etiology. There is no  intraventricular extension or hydrocephalus or mass-effect seen.  No old  infarction is noted.  There is a moderate degree of generalized  supratentorial subcortical atrophy noted.   Admission labs are pending.   IMPRESSION:  An 77 year old gentleman with hypertensive right basal ganglion  hemorrhage.  He has risk factors of hypertension, age, and remote history of  right hemispheric subcortical infarction.   PLAN:  The patient is being admitted to the neurology intensive care unit  for  monitoring.  We will start him on his antihypertensives and keep  systolic blood pressure below 161.  Continue his home medications.  Use IV  labetalol 20 mg p.r.n. for systolic above 180.  Repeat noncontrast CAT scan  of the head in the morning.  Speech therapy consult for swallowing  assessment.  Carotid ultrasound and cardiac echocardiogram.  Check fasting  lipid profile, hemoglobin A1c, and homocysteine.   I had a long discussion with the patient and his wife with regards to his  symptoms, discussed evaluation and treatment and answered questions.                                               Pramod P. Pearlean Brownie, MD    PPS/MEDQ  D:  05/17/2004  T:  05/17/2004  Job:  [id number]   cc:   [name], M.D. [facility]

## 2011-03-14 NOTE — Consult Note (Signed)
NAME:  [name], [name] NO.:  [id number]   MEDICAL RECORD NO.:  [id number]          PATIENT TYPE:  AMB   LOCATION:  DAY                           FACILITY:  [facility]   PHYSICIAN:  Lionel December, M.D.    DATE OF BIRTH:  10-Feb-1919   DATE OF CONSULTATION:  09/04/2005  DATE OF DISCHARGE:                                   CONSULTATION   CHIEF COMPLAINT:  Abdominal pain after reflux.   REQUESTING PHYSICIAN:  Patrica Duel, M.D.   HISTORY OF PRESENT ILLNESS:  [name] is an 77 year old Caucasian  gentleman, patient of Dr. Patrica Duel, presents today for further  evaluation of possible gastroesophageal reflux disease.  He noted around  February 2006 that he started having a bitter taste in his mouth.  This  typically occurred at bedtime.  He saw Dr. Patrica Duel who prescribed  Nexium and possibly Carafate.  Initially, it took care of this bitter taste.  However, within a couple of weeks he started having some atypical chest  discomfort associated with the urge to take deep breaths.  The patient  really denies any shortness of breath, however.  He had radiation of  discomfort into both arms, down to the elbows.  He would have some feeling  of heaviness or fatigue in the upper arms.  This has happened with exertion  as well as postprandially.  Even if he gets up in the middle of the night  and has a sip of water, he has sort of discomfort in his chest.  He has  since been evaluated with what sounds like a Cardiolite stress test which  was unremarkable.  He has been on Nexium now for over 8 months and continues  to be symptomatic.  He had an upper GI series done January 22, 2005, and was  found to have a small hiatal hernia with a very tiny Schatzki Ring which was  nonobstructing, mild duodenitis.  He denies any constipation, diarrhea,  melena or rectal bleeding dysphagia, odynophagia.   CURRENT MEDICATIONS:  1.  He is on multiple vitamins including HBT 900 mg b.i.d.  2.   __________ 112.5 mg t.i.d.  3.  Flexprotex 550 mg daily.  4.  Zinc 50 mcg daily p.r.n.  5.  Immutol 750 mg b.i.d.  6.  Fluid join 1000 mg daily.  7.  Avalide 150/12.5 mg daily.  8.  Nexium 40 mg daily.  9.  Guanfacine 2 mg daily.   ALLERGIES:  No known drug allergies.   PAST MEDICAL HISTORY:  1.  Hypertension.  2.  History of hemorrhagic stroke July 2005 with left sided upper extremity      weakness.  3.  History of peptic ulcer disease age 33, based on upper GI series.  4.  Beside the stroke, he was hospitalized for six weeks and gained over 20      pounds.  5.  Bilateral groin hernia repair.  6.  Colonoscopy 2004 with three polyps by Dr. Lovell Sheehan.  The patient states      he is due for another colonoscopy in 2007.  FAMILY HISTORY:  Mother died of pneumonia.  Father died at 57, had  hypertension.  No family history of colorectal cancer.   SOCIAL HISTORY:  He is married to a second wife for 8 months.  He is retired  from the [employer] and [employer] after 38 years of duty.  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  He has never  been a smoker.  He had three daughters, one died at age 65 of ovarian  cancer.  Her husband died three years later, and he raised his two  grandchildren.  He has one drink every couple of weeks.   REVIEW OF SYSTEMS:  See HPI for GI.  CONSTITUTIONAL:  A 30 pound weight gain  since July 2005.  CARDIOPULMONARY:  Denies any shortness of breath, but when  he has chest discomfort as described above, he feels the urge to take deep  breaths and exhale deeply.  Denies any cough.  No palpitations.   PHYSICAL EXAMINATION:  VITAL SIGNS:  Weight 194, height 5 feet 10-1/2,  temperature 98.2, blood pressure 126/80, pulse 68.  GENERAL APPEARANCE:  Pleasant, well-nourished, well-developed, elderly  Caucasian male in no acute distress.  SKIN:  Warm and dry, no jaundice.  HEENT:  Pupils equal, round and reactive to light.   Conjunctiva are pink.  Sclerae are nonicteric.  Oropharyngeal mucosa moist and pink.  No lesions,  erythema or exudate.  No lymphadenopathy, thyromegaly.  CHEST:  Lungs are clear to auscultation.  CARDIAC:  Regular rate and rhythm.  Normal S1, S2.  No murmurs, rubs or  gallops.  ABDOMEN:  Positive bowel sounds, obese, but symmetrical, soft, nontender.  No organomegaly or masses.  No rebound tenderness or guarding.  No abdominal  bruits or hernias.  EXTREMITIES:  No edema.   STUDIES:  X-rays as outlined above.  In addition, he had abdominal  ultrasound December 2005 which was normal.   IMPRESSION:  [name] is an 77 year old gentleman with an 8 month history  of atypical chest discomfort, possibly related to acid reflux disease.  However, he has been on Nexium for the past 8 months with limited  improvement of these symptoms.  He reports a negative Cardiolite stress  test.  It is somewhat concerning that his symptoms appear to be  postprandially as well as exertional.  On upper GI series earlier in the  year, he had evidence of very tiny hiatal hernia as well as tiny Schatzki  Ring and duodenitis.  We discussed today that his symptoms are both atypical  for cardiac as well as reflux.  I think it would be reasonable given the  chronicity of symptoms and failure to Nexium to proceed with upper  endoscopy.  If no mucosal abnormalities are found, would consider placement  of Bravo probes in order to help determine if these symptoms are related to  acid reflux.   PLAN:  1.  EGD with possible Bravo.  2.  Will have him hold Nexium therapy for two weeks prior to procedure in      order to get baseline.  3.  Further recommendations to follow.      Tana Coast, P.A.      Lionel December, M.D.  Electronically Signed    LL/MEDQ  D:  09/04/2005  T:  09/05/2005  Job:  [id number]   cc:   Patrica Duel, M.D.  Fax: [phone]

## 2011-03-14 NOTE — Discharge Summary (Signed)
NAME:  ALAN, DRUMMER NO.:  [id number]   MEDICAL RECORD NO.:  [id number]                   PATIENT TYPE:  IPS   LOCATION:  4025                                 FACILITY:  [facility]   PHYSICIAN:  Ellwood Dense, M.D.                DATE OF BIRTH:  10-05-1919   DATE OF ADMISSION:  05/22/2004  DATE OF DISCHARGE:  06/07/2004                                 DISCHARGE SUMMARY   DISCHARGE DIAGNOSES:  1. Right basal ganglia bleed with left hemiparesis, dysarthria, and     dysphagia.  2. Hypertension.  3. History of chronic blepharitis.   HISTORY OF PRESENT ILLNESS:  Mr. Kortz is an 77 year old male with history  of hypertension, CVA in 1998, mild residual left hemiparesis, admitted to  [facility] in 1992 with complaints of decreased balanced fall and  some left-sided weakness. BP 168/78 on admission.  CT done showed 1.5 x 1.5  cm right basal ganglia bleed. Carotid duplex done showed no ICA stenosis.  Cardiac echo showed an EF of 55% to 65% with mild mitral annular  calcification.  CT done on May 20, 2003, showed stable right thalamic,  right upper mid vein hemorrhagic infarct.  MB was done showing moderate  oropharyngeal dysphagia with penetration thin an nectar. The patient  currently on thick liquids with recommendation for repeat MBS in the next  few days. BP remained labile. He had left hemiparesis and facial droop  continuous. Currently, the patient is at supervision for bed and mobility,  moderate assistance for transfers, __________ total assists, 60% ambulatory  feet with loss of balance in single stance.   PAST MEDICAL HISTORY:  Significant for right inguinal hernia repair,  hypertension, CVA in 1998, nosebleeds three weeks, recent root canal, low  back pain, chronic blepharitis.   FAMILY HISTORY:  Positive for CV and asthma.   SOCIAL HISTORY:  The patient lives alone in a one-level home with three  steps to enter. She was independent  prior to admission. She was very active.  Recently took a trip to [place] a few weeks ago. He has occasional use of  alcohol. No smoking or tobacco.   HOSPITAL COURSE:  Mr. Shakim Faith was admitted to rehab on May 20, 2004,  for inpatient therapy to consistent of PT, OT, and speech therapy daily.  Labs done past admission showed hemoglobin 13.9, hematocrit 40.6, white  count 5.8, platelet count 220,000.  He was on honey-thick liquids, was  stable with sodium of 137, potassium 4.9, chloride 104, CO2 28, BUN 22,  creatinine 1.1, glucose 98. LFTs showed slight increase in alkaline  phosphatase of 90. At the time of admission the patient was noted to have  some sensory deficits in the left upper extremity, especially in the left  hand. Also noted to have left facial droop with decrease in voice volume,  mild left upper extremity greater than left lower  extremity with some  ataxia.  Speech therapy evaluation past admission on May 23, 2004, showed  patient with flash penetration due to decrease in laryngeal elevation. She  was advanced to regular diet, thin liquids with chin tuck for all liquids.  The patient has been able to tolerate current diet without difficulty. A  repeat followup study was done prior to discharge and the patient has  progressed to have inconsistent flash penetration. He does continue to  require to perform double swallows to decrease residual. Chin tucks are  however no longer needed with thin liquids. He is to continue to avoid  straws. Blood pressures have been checked on a twice a day basis and have  been variable. He is noted to have upward trend recently with blood pressure  trending in the 170s. BP meds were adjusted on June 05, 2004, and Catapres  added additionally on June 06, 2004.   The patient's mood has been stable. He has started speech therapy and has  progressed along. He does continue to demonstrate poor judgment of safety  awareness and he is  unable to maintain selective attention, distracting  environment without requiring moderate cueing. He shows poor awareness in  his left toe drag with gait at times. He requires moderate cues for emergent  awareness doing functional tasks and gait activities. Currently he is  transfer supervision to minimal assist ambulating to 350 feet, with a  rolling walker in the house full distances. He is at minimal assistance for  navigating a flight of stairs. He is at supervision for upper body care,  minimal assist for low body care seated. Position, he is at supervision for  toileting. Initially the patient reported problems with double vision and  visual perceptual problems, and these are improving. The patient's basic and  high level comprehension are intact. Basic high level expression is intact.  Left facial droop continues. Long-term memory is intact. Poor safety  awareness is reported in part secondary to decrease in functional safety in  situations secondary to supervision that is needed. Search for nursing home  was initiated and bed is available at [facility] for June 07, 2004, and  the patient is to be discharged to this facility. He will continue to  receive progressive PT/OT past discharge. On June 07, 2004, the patient is  to be discharged to [facility].   DISCHARGE MEDICATIONS:  1. Catapres 0.1 mg p.o. b.i.d.  2. Multivitamin one per day.  3. Anusol ointment to rectum b.i.d. p.r.n.  4. Altace 10 mg b.i.d.  5. Tenex 3 mg p.o. daily.  6. Ensure pudding supplement b.i.d.  7. Simethicone 80 mg p.o. q.i.d. p.r.n.   DIET:  Regular with no straws, double swallows. Assistance is as needed with  supervision for ambulation.   SPECIAL INSTRUCTIONS:  No alcohol, no smoking, no driving. Progressive PT/OT  to continue past discharge. Continue to monitoring blood pressures on a  twice a daily basis and titrate medications as needed.  FOLLOWUP:  The patient is to follow up with  LMD at nursing home; for routine  medical check to follow up with Dr. Pearlean Brownie in one month; to follow up Dr.  Thomasena Edis in one month.      Greg Cutter, P.A.                    Ellwood Dense, M.D.    PP/MEDQ  D:  06/06/2004  T:  06/06/2004  Job:  [id number]   cc:  Lenon Curt Chilton Si, M.D.  [address]  [place]  [place] [address]  Fax: [phone]   Pramod P. Pearlean Brownie, MD  Fax: [phone]

## 2011-03-14 NOTE — Op Note (Signed)
NAME:  [name], [name] NO.:  [id number]   MEDICAL RECORD NO.:  [id number]                   PATIENT TYPE:  AMB   LOCATION:  DAY                                  FACILITY:  [facility]   PHYSICIAN:  Dalia Heading, M.D.               DATE OF BIRTH:  [birth year]   DATE OF PROCEDURE:  10/06/2003  DATE OF DISCHARGE:                                 OPERATIVE REPORT   PREOPERATIVE DIAGNOSIS:  Right inguinal hernia.   POSTOPERATIVE DIAGNOSIS:  Right inguinal hernia.   PROCEDURE:  Right inguinal herniorrhaphy.   SURGEON:  Dalia Heading, M.D.   ANESTHESIA:  Spinal.   INDICATIONS FOR PROCEDURE:  The patient is an 77 year old white male who  presents with a right inguinal hernia.  The risks and benefits of the  procedure, including bleeding, infection, and recurrence of the hernia were  fully explained to the patient who gave informed consent.   DESCRIPTION OF PROCEDURE:  The patient was placed in the supine position  after spinal anesthesia was administered.  The right groin region was  prepped and draped using the usual sterile technique with Betadine.  Surgical site confirmation was performed.   A right groin region incision was made down to the external oblique  aponeurosis.  The aponeurosis was incised to the external ring.  A Penrose  drain was placed around the spermatic cord.  The vas deferens was noted  within the spermatic cord.  The ilioinguinal nerve was identified and  retracted inferiorly from the operative field.  A lipoma of the cord was  excised.  An indirect hernia sac was found.  This was freed away from the  spermatic cord up to the peritoneal reflection.  It was then inverted.  A  large-size polypropylene plug was then placed in this region.  An onlay  polypropylene mesh patch was then placed along the floor of the inguinal  canal and secured superiorly to the conjoined tendon and inferiorly to the  shelving edge of Poupart's ligament  using a 2-0 Novofil interrupted suture.  The internal ring was recreated using a 2-0 Novofil interrupted suture.  The  external oblique aponeurosis was reapproximated using a 2-0 Vicryl running  suture.  The subcutaneous layer was reapproximated using a 3-0 Vicryl  interrupted suture.  The skin was closed using a 4-0 Vicryl subcuticular  suture.  Sensorcaine 0.5% was instilled into the surrounding wound, and the  wound was covered with collodion.   All tape and needle counts were correct at the end of the procedure.  The  patient was transferred to the PACU in stable condition.   COMPLICATIONS:  None.   SPECIMENS:  None.   ESTIMATED BLOOD LOSS:  Minimal.      ___________________________________________  Dalia Heading, M.D.   MAJ/MEDQ  D:  10/06/2003  T:  10/06/2003  Job:  [id number]   cc:   Patrica Duel, M.D.  [address]  [place]  [place] [address]  Fax: [phone]

## 2011-07-16 LAB — BASIC METABOLIC PANEL
BUN: 21
BUN: 24 — ABNORMAL HIGH
BUN: 25 — ABNORMAL HIGH
CO2: 25
CO2: 27
Calcium: 8.8
Chloride: 105
Creatinine, Ser: 1.77 — ABNORMAL HIGH
GFR calc Af Amer: 44 — ABNORMAL LOW
GFR calc non Af Amer: 37 — ABNORMAL LOW
Glucose, Bld: 103 — ABNORMAL HIGH
Glucose, Bld: 121 — ABNORMAL HIGH
Potassium: 4
Potassium: 4

## 2011-07-16 LAB — CBC
HCT: 34.9 — ABNORMAL LOW
HCT: 37.3 — ABNORMAL LOW
MCHC: 33.7
MCV: 90.6
Platelets: 219
Platelets: 229
RDW: 13.4
RDW: 13.5

## 2011-07-16 LAB — PROTIME-INR: Prothrombin Time: 13.2

## 2011-07-16 LAB — DIFFERENTIAL
Basophils Absolute: 0
Basophils Absolute: 0
Basophils Relative: 0
Eosinophils Absolute: 0.1
Eosinophils Absolute: 0.1
Eosinophils Relative: 2
Eosinophils Relative: 3
Lymphocytes Relative: 23
Monocytes Absolute: 0.7

## 2011-07-16 LAB — LIPID PANEL
HDL: 23 — ABNORMAL LOW
Total CHOL/HDL Ratio: 9.2

## 2011-07-16 LAB — CARDIAC PANEL(CRET KIN+CKTOT+MB+TROPI)
CK, MB: 1.9
Total CK: 70
Troponin I: 0.04

## 2011-07-16 LAB — SAMPLE TO BLOOD BANK

## 2011-07-16 LAB — HEMOGLOBIN A1C: Hgb A1c MFr Bld: 6.1

## 2011-08-03 ENCOUNTER — Other Ambulatory Visit: Payer: Self-pay

## 2011-08-03 ENCOUNTER — Inpatient Hospital Stay (HOSPITAL_COMMUNITY)
Admission: EM | Admit: 2011-08-03 | Discharge: 2011-08-05 | DRG: 092 | Disposition: A | Discharge status: Home Health | Payer: Medicare Other | Attending: Internal Medicine | Admitting: Internal Medicine

## 2011-08-03 ENCOUNTER — Emergency Department (HOSPITAL_COMMUNITY): Payer: Medicare Other

## 2011-08-03 DIAGNOSIS — I639 Cerebral infarction, unspecified: Secondary | ICD-10-CM | POA: Diagnosis present

## 2011-08-03 DIAGNOSIS — I5032 Chronic diastolic (congestive) heart failure: Secondary | ICD-10-CM | POA: Diagnosis present

## 2011-08-03 DIAGNOSIS — I69959 Hemiplegia and hemiparesis following unspecified cerebrovascular disease affecting unspecified side: Secondary | ICD-10-CM

## 2011-08-03 DIAGNOSIS — N184 Chronic kidney disease, stage 4 (severe): Secondary | ICD-10-CM | POA: Diagnosis present

## 2011-08-03 DIAGNOSIS — I129 Hypertensive chronic kidney disease with stage 1 through stage 4 chronic kidney disease, or unspecified chronic kidney disease: Secondary | ICD-10-CM | POA: Diagnosis present

## 2011-08-03 DIAGNOSIS — G929 Unspecified toxic encephalopathy: Principal | ICD-10-CM | POA: Diagnosis present

## 2011-08-03 DIAGNOSIS — I10 Essential (primary) hypertension: Secondary | ICD-10-CM

## 2011-08-03 DIAGNOSIS — R5383 Other fatigue: Secondary | ICD-10-CM | POA: Diagnosis present

## 2011-08-03 DIAGNOSIS — G92 Toxic encephalopathy: Principal | ICD-10-CM | POA: Diagnosis present

## 2011-08-03 DIAGNOSIS — E860 Dehydration: Secondary | ICD-10-CM | POA: Diagnosis present

## 2011-08-03 DIAGNOSIS — T398X5A Adverse effect of other nonopioid analgesics and antipyretics, not elsewhere classified, initial encounter: Secondary | ICD-10-CM | POA: Diagnosis present

## 2011-08-03 DIAGNOSIS — I509 Heart failure, unspecified: Secondary | ICD-10-CM | POA: Diagnosis present

## 2011-08-03 DIAGNOSIS — N183 Chronic kidney disease, stage 3 unspecified: Secondary | ICD-10-CM | POA: Diagnosis present

## 2011-08-03 DIAGNOSIS — Z950 Presence of cardiac pacemaker: Secondary | ICD-10-CM

## 2011-08-03 DIAGNOSIS — I2510 Atherosclerotic heart disease of native coronary artery without angina pectoris: Secondary | ICD-10-CM | POA: Diagnosis present

## 2011-08-03 HISTORY — DX: Cerebral infarction, unspecified: I63.9

## 2011-08-03 HISTORY — DX: Atrioventricular block, complete: I44.2

## 2011-08-03 HISTORY — DX: Presence of cardiac pacemaker: Z95.0

## 2011-08-03 HISTORY — DX: Chronic kidney disease, stage 3 (moderate): N18.3

## 2011-08-03 HISTORY — DX: Atherosclerotic heart disease of native coronary artery without angina pectoris: I25.10

## 2011-08-03 HISTORY — DX: Chronic kidney disease, stage 3 unspecified: N18.30

## 2011-08-03 HISTORY — DX: Essential (primary) hypertension: I10

## 2011-08-03 HISTORY — DX: Acute myocardial infarction, unspecified: I21.9

## 2011-08-03 HISTORY — DX: Zoster without complications: B02.9

## 2011-08-03 HISTORY — DX: Gastro-esophageal reflux disease without esophagitis: K21.9

## 2011-08-03 LAB — CBC
Hemoglobin: 13 g/dL (ref 13.0–17.0)
Platelets: 224 10*3/uL (ref 150–400)
RBC: 4.1 MIL/uL — ABNORMAL LOW (ref 4.22–5.81)
WBC: 6.8 10*3/uL (ref 4.0–10.5)

## 2011-08-03 LAB — COMPREHENSIVE METABOLIC PANEL
ALT: 10 U/L (ref 0–53)
AST: 16 U/L (ref 0–37)
Albumin: 3.6 g/dL (ref 3.5–5.2)
Alkaline Phosphatase: 83 U/L (ref 39–117)
BUN: 36 mg/dL — ABNORMAL HIGH (ref 6–23)
Potassium: 4.2 mEq/L (ref 3.5–5.1)
Sodium: 134 mEq/L — ABNORMAL LOW (ref 135–145)
Total Protein: 6.7 g/dL (ref 6.0–8.3)

## 2011-08-03 LAB — TROPONIN I: Troponin I: <0.3 ng/mL (ref <0.30)

## 2011-08-03 LAB — DIFFERENTIAL
Lymphocytes Relative: 26 % (ref 12–46)
Lymphs Abs: 1.8 10*3/uL (ref 0.7–4.0)
Monocytes Relative: 11 % (ref 3–12)
Neutro Abs: 4.1 10*3/uL (ref 1.7–7.7)
Neutrophils Relative %: 60 % (ref 43–77)

## 2011-08-03 LAB — CK TOTAL AND CKMB (NOT AT ARMC)
CK, MB: 4.5 ng/mL — ABNORMAL HIGH (ref 0.3–4.0)
Relative Index: INVALID (ref 0.0–2.5)
Total CK: 66 U/L (ref 7–232)

## 2011-08-03 LAB — ETHANOL: Alcohol, Ethyl (B): <11 mg/dL (ref 0–11)

## 2011-08-03 MED ORDER — PANTOPRAZOLE SODIUM 40 MG PO TBEC
80.0000 mg | DELAYED_RELEASE_TABLET | Freq: Every day | ORAL | Status: DC
  Administered 2011-08-04 (×2): 80 mg via ORAL
  Filled 2011-08-03 (×3): qty 1

## 2011-08-03 MED ORDER — DOCUSATE SODIUM 100 MG PO CAPS: 100.0000 mg | ORAL_CAPSULE | Freq: Two times a day (BID) | ORAL | Status: DC | PRN

## 2011-08-03 MED ORDER — HYDRALAZINE HCL 25 MG PO TABS
50.0000 mg | ORAL_TABLET | Freq: Three times a day (TID) | ORAL | Status: DC
  Administered 2011-08-04 – 2011-08-05 (×5): 50 mg via ORAL
  Filled 2011-08-03 (×5): qty 2

## 2011-08-03 MED ORDER — POLYETHYLENE GLYCOL 3350 17 G PO PACK: 17.0000 g | PACK | Freq: Every day | ORAL | Status: DC | PRN

## 2011-08-03 MED ORDER — ENOXAPARIN SODIUM 30 MG/0.3ML ~~LOC~~ SOLN
30.0000 mg | Freq: Every day | SUBCUTANEOUS | Status: DC
  Administered 2011-08-04 – 2011-08-05 (×2): 30 mg via SUBCUTANEOUS
  Filled 2011-08-03 (×2): qty 0.3

## 2011-08-03 MED ORDER — BISACODYL 10 MG RE SUPP: 10.0000 mg | RECTAL | Status: DC | PRN

## 2011-08-03 MED ORDER — METOPROLOL TARTRATE 1 MG/ML IV SOLN
5.0000 mg | INTRAVENOUS | Status: DC | PRN
  Administered 2011-08-04: 5 mg via INTRAVENOUS
  Filled 2011-08-03: qty 5

## 2011-08-03 MED ORDER — LABETALOL HCL 5 MG/ML IV SOLN
INTRAVENOUS | Status: AC
  Administered 2011-08-03: 10 mg
  Filled 2011-08-03: qty 4

## 2011-08-03 MED ORDER — LABETALOL HCL 5 MG/ML IV SOLN
10.0000 mg | Freq: Once | INTRAVENOUS | Status: AC
  Administered 2011-08-03: 10 mg via INTRAVENOUS
  Filled 2011-08-03: qty 4

## 2011-08-03 MED ORDER — ASPIRIN EC 325 MG PO TBEC
325.0000 mg | DELAYED_RELEASE_TABLET | Freq: Every day | ORAL | Status: DC
  Administered 2011-08-04 – 2011-08-05 (×2): 325 mg via ORAL
  Filled 2011-08-03 (×4): qty 1

## 2011-08-03 MED ORDER — ACETAMINOPHEN 325 MG PO TABS: 650.0000 mg | ORAL_TABLET | ORAL | Status: DC | PRN

## 2011-08-03 MED ORDER — SODIUM CHLORIDE 0.9 % IV SOLN
INTRAVENOUS | Status: DC
  Administered 2011-08-03: via INTRAVENOUS

## 2011-08-03 MED ORDER — ONDANSETRON HCL 4 MG/2ML IJ SOLN: 4.0000 mg | Freq: Four times a day (QID) | INTRAMUSCULAR | Status: DC | PRN

## 2011-08-03 NOTE — ED Notes (Signed)
Pt received flu shot and injection in his back on Thursday.  Wife reports he had some flu-like symptoms thru the weekend and then this am when he awoke he was confused and slow to respond to his wife.  Wife reports the onset of symptoms approx 8 am.

## 2011-08-03 NOTE — H&P (Signed)
PCP:   No primary provider on file.  Dr. Assunta Found  Cardiologist Dr. Tresa Endo, [facility]  Chief Complaint:  Lethargy since this afternoon  HPI: [name] is an 77 y.o. male Caucasian with a historyof hypertension chronic kidney disease coronary artery disease and of multiple strokes and a mild left hemiplegia, who usually ambulates with the assistance of a walker. He does with his wife and remains mentally sharp, but her today feeling not quite right, and after lungs were noted to be markedly lethargic and a little confused.. patient was eventually brought to the emergency room to be evaluated CT scan of the head showed no acute abnormality, and during the interview he was noted to have a left facial weakness, which is wife says has been new since today. Wife also reports slurring of speech but no difficulty with swallowing or eating. Wife says his blood pressures been uncontrolled the past few days, patient says was well controlled on Thursday when he visited his cardiologist.  Of note patient's mentation improved back to baseline while in the emergency room, but his facial weakness did not resolve.  There is no history of nausea vomiting diarrhea or constipation. He has been on antibiotics for about a month since, a root canal a month ago.   Past Medical History  Diagnosis Date  . Coronary artery disease   . Stroke   . Hypertension   . Pacemaker   . CKD (chronic kidney disease), stage III   . Complete heart block   . GERD (gastroesophageal reflux disease)   . Diastolic heart failure     History reviewed. No pertinent past surgical history.  Medications:  HOME MEDS: Family and patient not sure of Home Meds: They do include metoprolol and torsemide;  Does NOT include amlodipine.  Prior to Admission medications   Medication Sig Start Date End Date Taking? Authorizing Provider  aspirin 325 MG buffered tablet Take 325 mg by mouth daily.     Yes Historical Provider, MD    esomeprazole (NEXIUM) 40 MG capsule Take 40 mg by mouth daily before breakfast.     Yes Historical Provider, MD  hydrALAZINE (APRESOLINE) 50 MG tablet Take 50 mg by mouth 3 (three) times daily.     Yes Historical Provider, MD  saw palmetto 160 MG capsule Take 160 mg by mouth daily.     Yes Historical Provider, MD    PRIOR TO [facility] Medications Prior to Admission  Medication Dose Route Frequency Provider Last Rate Last Dose  . labetalol (NORMODYNE,TRANDATE) 5 MG/ML injection 10 mg  10 mg Intravenous Once Gerhard Munch, MD   10 mg at 08/03/11 2030  . labetalol (NORMODYNE,TRANDATE) 5 MG/ML injection        10 mg at 08/03/11 2115   No current outpatient prescriptions on file as of 08/03/2011.    Allergies:  No Known Allergies  Social History:   reports that he has never smoked. He does not have any smokeless tobacco history on file. He reports that he drinks alcohol. He reports that he does not use illicit drugs.  Family History: History reviewed. No pertinent family history.  Rewiew of Systems:  The patient denies anorexia, fever, weight loss,, vision loss, decreased hearing, hoarseness, chest pain, syncope, dyspnea on exertion, peripheral edema, balance deficits, hemoptysis, abdominal pain, melena, hematochezia, severe indigestion/heartburn, hematuria, incontinence, genital sores, muscle weakness, suspicious skin lesions, transient blindness, difficulty walking, depression, unusual weight change, abnormal bleeding, enlarged lymph nodes, angioedema, and breast masses.  Physical  Exam: Filed Vitals:   08/03/11 2228 08/03/11 2230 08/03/11 2245 08/03/11 2300  BP: 189/107 200/101 198/108 208/93  Pulse: 60 58    Temp:   97.5 F (36.4 C)   TempSrc:      Resp: 17     SpO2: 98% 100%     Blood pressure 208/93, pulse 58, temperature 97.5 F (36.4 C), temperature source Oral, resp. rate 17, SpO2 100.00%.  ZOX:WRUEAVW mildly obese Caucasian gentleman lying flat on his back in the  stretcher, no respiratory distress; cooperative with exam PSYCH: He is alert and oriented x3, although he initially required a lot of prompting once orientated to maintain its orientation; does not appear anxious does not appear depressed; affect is normal HEENT: Mucous membranes pink  dry and anicteric; PERRLA; EOM intact; no cervical lymphadenopathy nor thyromegaly or carotid bruit; no JVD;Thick neck  Breasts:: Not examined CHEST WALL: No tenderness CHEST: Normal respiration, clear to auscultation bilaterally HEART: Regular rate and rhythm; no murmurs rubs or gallops BACK:  no CVA tenderness ABDOMEN: Obese, soft non-tender; no masses, no organomegaly, normal abdominal bowel sounds; no pannus; no intertriginous candida. Rectal Exam: Not done EXTREMITIES: ; age-appropriate arthropathy of the hands and knees;  trace edema  edema; no ulcerations. Genitalia: not examined PULSES: 21 and symmetric SKIN: Normal hydration no rash or ulceration CNS:  left facial weakness   mild weakness of left upper and lower extremity   Labs & Imaging Results for orders placed during the hospital encounter of 08/03/11 (from the past 48 hour(s))  CBC     Status: Abnormal   Collection Time   08/03/11  8:32 PM      Component Value Range Comment   WBC 6.8  4.0 - 10.5 (K/uL)    RBC 4.10 (*) 4.22 - 5.81 (MIL/uL)    Hemoglobin 13.0  13.0 - 17.0 (g/dL)    HCT 09.8  11.9 - 14.7 (%)    MCV 96.1  78.0 - 100.0 (fL)    MCH 31.7  26.0 - 34.0 (pg)    MCHC 33.0  30.0 - 36.0 (g/dL)    RDW 82.9  56.2 - 13.0 (%)    Platelets 224  150 - 400 (K/uL)   DIFFERENTIAL     Status: Normal   Collection Time   08/03/11  8:32 PM      Component Value Range Comment   Neutrophils Relative 60  43 - 77 (%)    Neutro Abs 4.1  1.7 - 7.7 (K/uL)    Lymphocytes Relative 26  12 - 46 (%)    Lymphs Abs 1.8  0.7 - 4.0 (K/uL)    Monocytes Relative 11  3 - 12 (%)    Monocytes Absolute 0.8  0.1 - 1.0 (K/uL)    Eosinophils Relative 2  0 - 5 (%)     Eosinophils Absolute 0.2  0.0 - 0.7 (K/uL)    Basophils Relative 1  0 - 1 (%)    Basophils Absolute 0.0  0.0 - 0.1 (K/uL)   CK TOTAL AND CKMB     Status: Abnormal   Collection Time   08/03/11  8:32 PM      Component Value Range Comment   Total CK 66  7 - 232 (U/L)    CK, MB 4.5 (*) 0.3 - 4.0 (ng/mL)    Relative Index RELATIVE INDEX IS INVALID  0.0 - 2.5    COMPREHENSIVE METABOLIC PANEL     Status: Abnormal   Collection Time  08/03/11  8:32 PM      Component Value Range Comment   Sodium 134 (*) 135 - 145 (mEq/L)    Potassium 4.2  3.5 - 5.1 (mEq/L)    Chloride 94 (*) 96 - 112 (mEq/L)    CO2 30  19 - 32 (mEq/L)    Glucose, Bld 108 (*) 70 - 99 (mg/dL)    BUN 36 (*) 6 - 23 (mg/dL)    Creatinine, Ser 9.14 (*) 0.50 - 1.35 (mg/dL)    Calcium 9.9  8.4 - 10.5 (mg/dL)    Total Protein 6.7  6.0 - 8.3 (g/dL)    Albumin 3.6  3.5 - 5.2 (g/dL)    AST 16  0 - 37 (U/L)    ALT 10  0 - 53 (U/L)    Alkaline Phosphatase 83  39 - 117 (U/L)    Total Bilirubin 0.4  0.3 - 1.2 (mg/dL)    GFR calc non Af Amer 34 (*) >90 (mL/min)    GFR calc Af Amer 39 (*) >90 (mL/min)   TROPONIN I     Status: Normal   Collection Time   08/03/11  8:32 PM      Component Value Range Comment   Troponin I <0.30  <0.30 (ng/mL)    Dg Chest 1 View  08/03/2011  *RADIOLOGY REPORT*  Clinical Data: Altered mental status  CHEST - 1 VIEW  Comparison: 01/31/2010  Findings: Cardiomegaly with prominent mediastinal contours, similar to prior allowing for differences in technique.  Left chest wall battery pack with dual leads, unchanged in position.  Bibasilar linear opacities are likely scarring or atelectasis.  No other focal areas of consolidation.  No pleural effusion or pneumothorax identified.  No acute osseous abnormality.  IMPRESSION: Prominent cardiomediastinal contours.  Bibasilar linear opacities, likely scarring or atelectasis.  Original Report Authenticated By: Waneta Martins, M.D.   Ct Head Wo Contrast  08/03/2011   *RADIOLOGY REPORT*  Clinical Data: Altered mental status.  Seizures.  Weakness.  CT HEAD WITHOUT CONTRAST  Technique:  Contiguous axial images were obtained from the base of the skull through the vertex without contrast.  Comparison: 11/03/2007  Findings: Diffuse cerebral and cerebellar atrophy.  Focal low attenuation areas of encephalomalacia in the frontal lobes bilaterally, and in the right parietal lobe consistent with old infarcts.  Changes appear stable since the previous study.  Low attenuation change throughout the deep and periventricular white matter consistent with small vessel ischemic change.  Multiple old lacunar infarcts in the basal ganglia and thalami bilaterally. Mild ventricular dilatation consistent with central atrophy.  No mass effect or midline shift.  No abnormal extra-axial fluid collections.  Gray-white matter junctions are mostly distinct.  No effacement of the basal cisterns.  No evidence of acute intracranial hemorrhage.  No depressed skull fractures.  The mucosal membrane thickening in the maxillary antra.  Postoperative changes suggested in the right maxilla.  Probable old nasal fractures.  Fairly extensive intracranial arterial vascular calcifications with what appears to be high-grade calcific stenosis of the left vertebral artery in the left internal carotid artery.  IMPRESSION: Diffuse atrophy and small vessel ischemic changes with multifocal old infarcts.  Stable appearance since the previous study.  No evidence of acute intracranial hemorrhage, mass lesion, or acute infarct.  Original Report Authenticated By: Marlon Pel, M.D.      Assessment Present on Admission:  .CVA (cerebral infarction) .Lethargy .HTN (hypertension) .Dehydration .Diastolic heart failure .CKD (chronic kidney disease), stage III .CAD (coronary artery disease)  PLAN: Clinically patient seems to have a stroke, unclear if this is a TIA at this time we will admit on a stroke  protocol.  His blood pressure is markedly uncontrolled unclear if this is causing his symptoms i.e. hypertensive encephalopathy, or if this is secondary to an acute stroke, since he is improving and his blood pressures remains markedly elevated we'll hydrate him and aim to keep a systolic of around 190, and consult neurology for assistance.  We need to get his full medical clear idea what medications she is supposedly taking for his blood pressure. We'll also get a 2-D echo.  Will need PT OT and social work assessment to see if he'll be well enough to return home in the short run.  Other plans as per orders.  Critical care time: 60 minutes.  Mareo Portilla 08/03/2011, 11:19 PM

## 2011-08-03 NOTE — ED Provider Notes (Signed)
History     CSN: [id number] Arrival date & time: 08/03/2011  7:43 PM  Chief Complaint  Patient presents with  . Altered Mental Status    (Consider location/radiation/quality/duration/timing/severity/associated sxs/prior treatment) HPI The patient has a history of multiple prior strokes. He now presents with his family due to concerns of altered mental status. They note that he was in his usual state of health prior to 3 days ago. On that day he received the influenza vaccine, as well as a steroid injection into his hip for chronic pain. The following day the patient had a flu like illness, but yesterday the patient was noted to be back in his usual state of health. Since awakening this morning, approximately 14 hours ago, the patient has been listless, significantly less interactive, and using notably slower speech. The patient is awake, alert, answering questions, but notes that his family's concerns seem appropriate. He denies any focal pain, focal weakness, focal complaints. No clear alleviating, or exacerbating factors. Past Medical History  Diagnosis Date  . Coronary artery disease   . Stroke   . Hypertension   . Pacemaker     History reviewed. No pertinent past surgical history.  History reviewed. No pertinent family history.  History  Substance Use Topics  . Smoking status: Never Smoker   . Smokeless tobacco: Not on file  . Alcohol Use: Yes      Review of Systems  Constitutional: Negative for chills and fatigue.  HENT: Negative.   Eyes: Negative.   Respiratory: Negative for cough.   Cardiovascular: Negative for chest pain.  Gastrointestinal: Negative.   Genitourinary: Negative.   Musculoskeletal:       Hpi  Skin: Negative.   Neurological: Positive for facial asymmetry.    Allergies  Review of patient's allergies indicates not on file.  Home Medications  No current outpatient prescriptions on file.  BP 228/123  Pulse 60  Temp(Src) 98.7 F (37.1 C) (Oral)   Resp 20  SpO2 95%  Physical Exam  Constitutional: He appears well-developed and well-nourished. No distress.  HENT:  Head: Normocephalic and atraumatic.       Mild left facial droop  Eyes: EOM are normal. Pupils are equal, round, and reactive to light.  Neck: Neck supple.  Cardiovascular: Normal rate and regular rhythm.   Murmur heard. Pulmonary/Chest: Effort normal and breath sounds normal.  Abdominal: Soft. He exhibits no distension.  Musculoskeletal: He exhibits no edema and no tenderness.  Neurological: He is alert. He has normal strength. He displays no tremor. No cranial nerve deficit or sensory deficit. He exhibits abnormal muscle tone. GCS eye subscore is 4. GCS verbal subscore is 5. GCS motor subscore is 6.       patient is oriented, though slow to respond but answers. The patient is known to have left-sided deficits. On pronator drift test there is a mild drift on the left side. Patient's strength is symmetric 5 out of 5 in both upper and lower extremities.  Skin: Skin is warm and dry.    ED Course  Procedures (including critical care time)   Labs Reviewed  CBC  DIFFERENTIAL  CK TOTAL AND CKMB  COMPREHENSIVE METABOLIC PANEL  TROPONIN I  URINALYSIS, ROUTINE W REFLEX MICROSCOPIC   No results found.   No diagnosis found.  CT: chronic, but no acute changes  CXR: No acute findings   Date: 08/03/2011  Rate: 69  Rhythm: paced  QRS Axis: left  Intervals: paced  ST/T Wave abnormalities: nonspecific T wave  changes  Conduction Disutrbances:left anterior fascicular block  Narrative Interpretation:   Old EKG Reviewed: not available    MDM  This 77 year old male presents with one-day altered mental status.  Initial eval notable for an elderly male in no distress, but slow of speech with delayed responses to straightforward questions.  There are mild, but no significant neuro deficits, which may be 2/2 diaskesis.  Concern for TIA vs. Hypertensive crisis.  (Initial  BP was 234/127).   The patient's recent steroid injection and flu vaccine are considered as etiologies of his presentation, but absent fever, focal signs, these seem less likely.  Soon after arrival the patient received Metoprolol w improvement in his BP, no discernable cognitive change.        Gerhard Munch, MD 08/05/11 2306

## 2011-08-04 ENCOUNTER — Inpatient Hospital Stay (HOSPITAL_COMMUNITY): Payer: Medicare Other

## 2011-08-04 ENCOUNTER — Ambulatory Visit (HOSPITAL_COMMUNITY)
Admit: 2011-08-04 | Discharge: 2011-08-04 | Disposition: A | Discharge status: Home / Self Care | Payer: Medicare Other | Attending: Internal Medicine | Admitting: Internal Medicine

## 2011-08-04 LAB — LIPID PANEL
HDL: 35 mg/dL — ABNORMAL LOW (ref >39)
LDL Cholesterol: 147 mg/dL — ABNORMAL HIGH (ref 0–99)

## 2011-08-04 LAB — URINALYSIS, ROUTINE W REFLEX MICROSCOPIC
Bilirubin Urine: NEGATIVE
Glucose, UA: NEGATIVE mg/dL
Leukocytes, UA: NEGATIVE
Nitrite: NEGATIVE
Specific Gravity, Urine: 1.02 (ref 1.005–1.030)
pH: 7 (ref 5.0–8.0)

## 2011-08-04 LAB — URINE MICROSCOPIC-ADD ON
RBC / HPF: NONE SEEN RBC/hpf (ref <3)
WBC, UA: NONE SEEN WBC/hpf (ref <3)

## 2011-08-04 LAB — BASIC METABOLIC PANEL
BUN: 31 mg/dL — ABNORMAL HIGH (ref 6–23)
Calcium: 9.7 mg/dL (ref 8.4–10.5)
GFR calc Af Amer: 48 mL/min — ABNORMAL LOW (ref >90)
GFR calc non Af Amer: 41 mL/min — ABNORMAL LOW (ref >90)
Potassium: 3.7 mEq/L (ref 3.5–5.1)

## 2011-08-04 LAB — DIFFERENTIAL
Basophils Absolute: 0 10*3/uL (ref 0.0–0.1)
Basophils Relative: 0 % (ref 0–1)
Monocytes Relative: 9 % (ref 3–12)
Neutro Abs: 5.2 10*3/uL (ref 1.7–7.7)
Neutrophils Relative %: 72 % (ref 43–77)

## 2011-08-04 LAB — CBC
Hemoglobin: 13.2 g/dL (ref 13.0–17.0)
MCHC: 32.6 g/dL (ref 30.0–36.0)
Platelets: 203 10*3/uL (ref 150–400)
RDW: 12.9 % (ref 11.5–15.5)

## 2011-08-04 MED ORDER — POTASSIUM CHLORIDE CRYS ER 10 MEQ PO TBCR
10.0000 meq | EXTENDED_RELEASE_TABLET | Freq: Every day | ORAL | Status: DC
  Administered 2011-08-04 – 2011-08-05 (×2): 10 meq via ORAL
  Filled 2011-08-04 (×2): qty 1

## 2011-08-04 MED ORDER — TRAMADOL HCL 50 MG PO TABS: 50.0000 mg | ORAL_TABLET | Freq: Four times a day (QID) | ORAL | Status: DC | PRN

## 2011-08-04 MED ORDER — METOPROLOL TARTRATE 50 MG PO TABS
50.0000 mg | ORAL_TABLET | Freq: Two times a day (BID) | ORAL | Status: DC
  Administered 2011-08-04 (×2): 50 mg via ORAL
  Filled 2011-08-04 (×2): qty 1

## 2011-08-04 MED ORDER — TORSEMIDE 20 MG PO TABS
20.0000 mg | ORAL_TABLET | Freq: Every day | ORAL | Status: DC
  Administered 2011-08-04 – 2011-08-05 (×2): 20 mg via ORAL
  Filled 2011-08-04 (×2): qty 1

## 2011-08-04 MED ORDER — HYDRALAZINE HCL 20 MG/ML IJ SOLN
10.0000 mg | INTRAMUSCULAR | Status: DC | PRN
  Administered 2011-08-04: 10 mg via INTRAVENOUS
  Filled 2011-08-04 (×2): qty 1

## 2011-08-04 MED ORDER — DOXAZOSIN MESYLATE 2 MG PO TABS
1.0000 mg | ORAL_TABLET | Freq: Every day | ORAL | Status: DC
  Administered 2011-08-04: 1 mg via ORAL
  Filled 2011-08-04: qty 1

## 2011-08-04 MED ORDER — ASPIRIN 325 MG PO TABS: 325.0000 mg | ORAL_TABLET | Freq: Every day | ORAL | Status: DC

## 2011-08-04 MED ORDER — LABETALOL HCL 5 MG/ML IV SOLN
10.0000 mg | INTRAVENOUS | Status: DC | PRN
  Administered 2011-08-04 – 2011-08-05 (×2): 10 mg via INTRAVENOUS
  Filled 2011-08-04: qty 4

## 2011-08-04 MED ORDER — SODIUM CHLORIDE 0.9 % IJ SOLN
INTRAMUSCULAR | Status: AC
  Administered 2011-08-04: 10 mL
  Filled 2011-08-04: qty 10

## 2011-08-04 NOTE — Progress Notes (Signed)
Informed Dr. Orvan Falconer of patient's blood pressures. Informed him of patient complaining of dizziness when sitting or standing. Dizziness decreases when patient lies down. No further orders at this time. Continue to monitor patient. Rolm Bookbinder. Christell Constant, RN

## 2011-08-04 NOTE — Consult Note (Signed)
Reason for Consult:AMS Referring Physician: Ayham Word is an 77 y.o. male.  HPI:  The patient is a 77 year old man whose to be sharp at baseline her review of the chart. He presents with the relatively acute onset of difficulty speaking, confusion and altered mentation. He was noted by the hospitalist to have left facial droop. The wife reports that this is new. The patient cognition appears to have improved since admission. He seems to be focused on the fact that he has had some dental treatment of last month. He had a couple of her canals. She reports that he has had some short-term memory impairment over the past month and he relates this to his dental procedures. He does seem to be aware of that he was sent to the hospital because of cognitive impairment particularly in short-term memory problems and yesterday. He reports that he did not go to church and this may have been concerning to his wife. It appears that the patient did have some confusion 3 days ago and to this improved until yesterday. He was given anbIM injection of steroids into tye right hip pain. He relates me that he has had these cortisone injections without incidence previously.  Past Medical History  Diagnosis Date  . Coronary artery disease   . Hypertension   . Pacemaker   . CKD (chronic kidney disease), stage III   . Complete heart block   . GERD (gastroesophageal reflux disease)   . Diastolic heart failure   . Myocardial infarction 2010  . Stroke 1998, 2005, 2009  . Shingles     Past Surgical History  Procedure Date  . Insert / replace / remove pacemaker   . Eye surgery     History reviewed. No pertinent family history.  Social History:  reports that he has never smoked. He does not have any smokeless tobacco history on file. He reports that he drinks alcohol. He reports that he does not use illicit drugs.  Allergies: No Known Allergies  Medications:  Prior to Admission medications     Medication Sig Start Date End Date Taking? Authorizing Provider  aspirin 325 MG buffered tablet Take 325 mg by mouth daily.     Yes Historical Provider, MD  doxazosin (CARDURA) 1 MG tablet Take 1 mg by mouth at bedtime.     Yes Historical Provider, MD  esomeprazole (NEXIUM) 40 MG capsule Take 40 mg by mouth daily before breakfast.     Yes Historical Provider, MD  hydrALAZINE (APRESOLINE) 50 MG tablet Take 50 mg by mouth 3 (three) times daily.     Yes Historical Provider, MD  metoprolol (LOPRESSOR) 50 MG tablet Take 50 mg by mouth 2 (two) times daily.     Yes Historical Provider, MD  potassium chloride (KLOR-CON) 10 MEQ CR tablet Take 10 mEq by mouth daily.     Yes Historical Provider, MD  saw palmetto 160 MG capsule Take 160 mg by mouth daily.     Yes Historical Provider, MD  torsemide (DEMADEX) 20 MG tablet Take 20 mg by mouth daily.     Yes Historical Provider, MD  traMADol (ULTRAM) 50 MG tablet Take 50 mg by mouth every 6 (six) hours as needed.     Yes Historical Provider, MD    Scheduled Meds:   . aspirin EC  325 mg Oral Daily  . enoxaparin  30 mg Subcutaneous Daily  . hydrALAZINE  50 mg Oral Q8H  . labetalol  10 mg Intravenous Once  .  labetalol      . pantoprazole  80 mg Oral Q1200   Continuous Infusions:   . sodium chloride 75 mL/hr at 08/03/11 2350   PRN Meds:.acetaminophen, bisacodyl, docusate sodium, hydrALAZINE, metoprolol, ondansetron (ZOFRAN) IV, polyethylene glycol   Results for orders placed during the hospital encounter of 08/03/11 (from the past 48 hour(s))  CBC     Status: Abnormal   Collection Time   08/03/11  8:32 PM      Component Value Range Comment   WBC 6.8  4.0 - 10.5 (K/uL)    RBC 4.10 (*) 4.22 - 5.81 (MIL/uL)    Hemoglobin 13.0  13.0 - 17.0 (g/dL)    HCT 16.1  09.6 - 04.5 (%)    MCV 96.1  78.0 - 100.0 (fL)    MCH 31.7  26.0 - 34.0 (pg)    MCHC 33.0  30.0 - 36.0 (g/dL)    RDW 40.9  81.1 - 91.4 (%)    Platelets 224  150 - 400 (K/uL)   DIFFERENTIAL      Status: Normal   Collection Time   08/03/11  8:32 PM      Component Value Range Comment   Neutrophils Relative 60  43 - 77 (%)    Neutro Abs 4.1  1.7 - 7.7 (K/uL)    Lymphocytes Relative 26  12 - 46 (%)    Lymphs Abs 1.8  0.7 - 4.0 (K/uL)    Monocytes Relative 11  3 - 12 (%)    Monocytes Absolute 0.8  0.1 - 1.0 (K/uL)    Eosinophils Relative 2  0 - 5 (%)    Eosinophils Absolute 0.2  0.0 - 0.7 (K/uL)    Basophils Relative 1  0 - 1 (%)    Basophils Absolute 0.0  0.0 - 0.1 (K/uL)   CK TOTAL AND CKMB     Status: Abnormal   Collection Time   08/03/11  8:32 PM      Component Value Range Comment   Total CK 66  7 - 232 (U/L)    CK, MB 4.5 (*) 0.3 - 4.0 (ng/mL)    Relative Index RELATIVE INDEX IS INVALID  0.0 - 2.5    COMPREHENSIVE METABOLIC PANEL     Status: Abnormal   Collection Time   08/03/11  8:32 PM      Component Value Range Comment   Sodium 134 (*) 135 - 145 (mEq/L)    Potassium 4.2  3.5 - 5.1 (mEq/L)    Chloride 94 (*) 96 - 112 (mEq/L)    CO2 30  19 - 32 (mEq/L)    Glucose, Bld 108 (*) 70 - 99 (mg/dL)    BUN 36 (*) 6 - 23 (mg/dL)    Creatinine, Ser 7.82 (*) 0.50 - 1.35 (mg/dL)    Calcium 9.9  8.4 - 10.5 (mg/dL)    Total Protein 6.7  6.0 - 8.3 (g/dL)    Albumin 3.6  3.5 - 5.2 (g/dL)    AST 16  0 - 37 (U/L)    ALT 10  0 - 53 (U/L)    Alkaline Phosphatase 83  39 - 117 (U/L)    Total Bilirubin 0.4  0.3 - 1.2 (mg/dL)    GFR calc non Af Amer 34 (*) >90 (mL/min)    GFR calc Af Amer 39 (*) >90 (mL/min)   TROPONIN I     Status: Normal   Collection Time   08/03/11  8:32 PM  Component Value Range Comment   Troponin I <0.30  <0.30 (ng/mL)   ETHANOL     Status: Normal   Collection Time   08/03/11  8:32 PM      Component Value Range Comment   Alcohol, Ethyl (B) <11  0 - 11 (mg/dL)   URINALYSIS, ROUTINE W REFLEX MICROSCOPIC     Status: Abnormal   Collection Time   08/04/11  3:15 AM      Component Value Range Comment   Color, Urine YELLOW  YELLOW     Appearance CLEAR  CLEAR      Specific Gravity, Urine 1.020  1.005 - 1.030     pH 7.0  5.0 - 8.0     Glucose, UA NEGATIVE  NEGATIVE (mg/dL)    Hgb urine dipstick NEGATIVE  NEGATIVE     Bilirubin Urine NEGATIVE  NEGATIVE     Ketones, ur NEGATIVE  NEGATIVE (mg/dL)    Protein, ur 161 (*) NEGATIVE (mg/dL)    Urobilinogen, UA 0.2  0.0 - 1.0 (mg/dL)    Nitrite NEGATIVE  NEGATIVE     Leukocytes, UA NEGATIVE  NEGATIVE    URINE MICROSCOPIC-ADD ON     Status: Normal   Collection Time   08/04/11  3:15 AM      Component Value Range Comment   WBC, UA    <3 (WBC/hpf)    Value: NO FORMED ELEMENTS SEEN ON URINE MICROSCOPIC EXAMINATION   RBC / HPF    <3 (RBC/hpf)    Value: NO FORMED ELEMENTS SEEN ON URINE MICROSCOPIC EXAMINATION  LIPID PANEL     Status: Abnormal   Collection Time   08/04/11  6:18 AM      Component Value Range Comment   Cholesterol 214 (*) 0 - 200 (mg/dL)    Triglycerides 096 (*) <150 (mg/dL)    HDL 35 (*) >04 (mg/dL)    Total CHOL/HDL Ratio 6.1      VLDL 32  0 - 40 (mg/dL)    LDL Cholesterol 540 (*) 0 - 99 (mg/dL)   BASIC METABOLIC PANEL     Status: Abnormal   Collection Time   08/04/11  6:18 AM      Component Value Range Comment   Sodium 134 (*) 135 - 145 (mEq/L)    Potassium 3.7  3.5 - 5.1 (mEq/L)    Chloride 97  96 - 112 (mEq/L)    CO2 26  19 - 32 (mEq/L)    Glucose, Bld 113 (*) 70 - 99 (mg/dL)    BUN 31 (*) 6 - 23 (mg/dL)    Creatinine, Ser 9.81 (*) 0.50 - 1.35 (mg/dL)    Calcium 9.7  8.4 - 10.5 (mg/dL)    GFR calc non Af Amer 41 (*) >90 (mL/min)    GFR calc Af Amer 48 (*) >90 (mL/min)   CBC     Status: Normal   Collection Time   08/04/11  6:18 AM      Component Value Range Comment   WBC 7.2  4.0 - 10.5 (K/uL)    RBC 4.27  4.22 - 5.81 (MIL/uL)    Hemoglobin 13.2  13.0 - 17.0 (g/dL)    HCT 19.1  47.8 - 29.5 (%)    MCV 94.8  78.0 - 100.0 (fL)    MCH 30.9  26.0 - 34.0 (pg)    MCHC 32.6  30.0 - 36.0 (g/dL)    RDW 62.1  30.8 - 65.7 (%)    Platelets 203  150 -  400 (K/uL)   DIFFERENTIAL      Status: Normal   Collection Time   08/04/11  6:18 AM      Component Value Range Comment   Neutrophils Relative 72  43 - 77 (%)    Neutro Abs 5.2  1.7 - 7.7 (K/uL)    Lymphocytes Relative 18  12 - 46 (%)    Lymphs Abs 1.3  0.7 - 4.0 (K/uL)    Monocytes Relative 9  3 - 12 (%)    Monocytes Absolute 0.6  0.1 - 1.0 (K/uL)    Eosinophils Relative 1  0 - 5 (%)    Eosinophils Absolute 0.1  0.0 - 0.7 (K/uL)    Basophils Relative 0  0 - 1 (%)    Basophils Absolute 0.0  0.0 - 0.1 (K/uL)     Dg Chest 1 View  08/03/2011  *RADIOLOGY REPORT*  Clinical Data: Altered mental status  CHEST - 1 VIEW  Comparison: 01/31/2010  Findings: Cardiomegaly with prominent mediastinal contours, similar to prior allowing for differences in technique.  Left chest wall battery pack with dual leads, unchanged in position.  Bibasilar linear opacities are likely scarring or atelectasis.  No other focal areas of consolidation.  No pleural effusion or pneumothorax identified.  No acute osseous abnormality.  IMPRESSION: Prominent cardiomediastinal contours.  Bibasilar linear opacities, likely scarring or atelectasis.  Original Report Authenticated By: Waneta Martins, M.D.   Ct Head Wo Contrast  08/03/2011  *RADIOLOGY REPORT*  Clinical Data: Altered mental status.  Seizures.  Weakness.  CT HEAD WITHOUT CONTRAST  Technique:  Contiguous axial images were obtained from the base of the skull through the vertex without contrast.  Comparison: 11/03/2007  Findings: Diffuse cerebral and cerebellar atrophy.  Focal low attenuation areas of encephalomalacia in the frontal lobes bilaterally, and in the right parietal lobe consistent with old infarcts.  Changes appear stable since the previous study.  Low attenuation change throughout the deep and periventricular white matter consistent with small vessel ischemic change.  Multiple old lacunar infarcts in the basal ganglia and thalami bilaterally. Mild ventricular dilatation consistent with central  atrophy.  No mass effect or midline shift.  No abnormal extra-axial fluid collections.  Gray-white matter junctions are mostly distinct.  No effacement of the basal cisterns.  No evidence of acute intracranial hemorrhage.  No depressed skull fractures.  The mucosal membrane thickening in the maxillary antra.  Postoperative changes suggested in the right maxilla.  Probable old nasal fractures.  Fairly extensive intracranial arterial vascular calcifications with what appears to be high-grade calcific stenosis of the left vertebral artery in the left internal carotid artery.  IMPRESSION: Diffuse atrophy and small vessel ischemic changes with multifocal old infarcts.  Stable appearance since the previous study.  No evidence of acute intracranial hemorrhage, mass lesion, or acute infarct.  Original Report Authenticated By: Marlon Pel, M.D.    Review of Systems  Constitutional: Negative.   Eyes: Negative.   Respiratory: Negative.   Cardiovascular: Negative.   Gastrointestinal: Negative.   Genitourinary: Negative.   Musculoskeletal: Positive for joint pain.  Skin: Negative.   Endo/Heme/Allergies: Negative.   Psychiatric/Behavioral: Negative.    Blood pressure 210/92, pulse 61, temperature 98.5 F (36.9 C), temperature source Oral, resp. rate 20, height 5\' 10"  (1.778 m), weight 92 kg (202 lb 13.2 oz), SpO2 96.00%. Physical Exam GENERAL: Average weight the pleasant man in no acute distress.  HEENT: Supple. Atraumatic normocephalic.   ABDOMEN: soft  EXTREMITIES: No edema  BACK: Normal.  SKIN: Normal by inspection.    MENTAL STATUS: The patient is alert. He is mostly lucid and responses to commands. He is somewhat slow to respond to verbal commands however. He knows that its 2012 and that is October. Speech is normal. Cognition seems generally unremarkable. He is lucid.  CRANIAL NERVES: Pupils are equal, round and reactive to light and accomodation; extra ocular movements are full,  there is no significant nystagmus; visual fields are full; upper and lower facial muscles are normal in strength and symmetric, there is no flattening of the nasolabial folds; tongue is midline; uvula is midline; shoulder elevation is normal.  MOTOR: The patient has a mild left hemiparesis with spasticity. The left upper extremity has pretty good strength except for hand grip of 4+. Other muscle groups of the left upper extremity is 5. There is mild spasticity of the left upper extremity. There is a mild pronator drift of the left upper extremity. Left lower extremity is weaker graded as 4/5 proximally and distally 3/5 with dorsiflexion. Again tone is more increased in the left lower extremity. Right side shows normal tone, bulk and strength.  COORDINATION: Left finger to nose is normal, right finger to nose is normal, No rest tremor; no intention tremor; no postural tremor; no bradykinesia.  REFLEXES: Deep tendon reflexes are symmetrical and normal. Babinski reflexes are flexor bilaterally.   SENSATION: Normal to light touch and pinprick.   Head CT scan is reviewed in person. There is confluent periventricular and deep white matter severe leukoencephalopathy. There is two-year a subcortical watershed infarcts involving the left frontal lobe and the right parietal occipital lobe. These are old infarcts.   Assessment/Plan: 1. Acute encephalopathy/confusion associated with has resolved spontaneously. The presentation is concerning for seizures. He has had a couple of cortical strokes which increase the risk of seizures. Given the focal problems with facial weakness on the left side he also may have had a small subcortical infarct. The patient has an extensive workup including EEG and if there are feel a carotid duplex Doppler. We'll follow the results. He also is continue with antiplatelet agent.  Jessaca Philippi 08/04/2011, 8:22 AM

## 2011-08-04 NOTE — Progress Notes (Signed)
Chart reviewed.  Subjective: no new complaints  Objective: Vital signs in last 24 hours: Filed Vitals:   08/04/11 0200 08/04/11 0300 08/04/11 0400 08/04/11 0837  BP: 198/102 227/128 210/92 187/96  Pulse: 64 64 61 63  Temp:   98.5 F (36.9 C) 97.5 F (36.4 C)  TempSrc:   Oral Oral  Resp:   20 18  Height:      Weight:      SpO2:   96% 94%   Weight change:   Intake/Output Summary (Last 24 hours) at 08/04/11 1059 Last data filed at 08/04/11 0800  Gross per 24 hour  Intake    625 ml  Output    725 ml  Net   -100 ml   General: Comfortable. HEENT: Minimal left nasolabial fold flattening, subtle Lungs clear to auscultation bilaterally without wheeze rhonchi or rales Cardiovascular regular rate rhythm without murmurs gallops rubs  abdomen soft nontender nondistended Extremities no clubbing cyanosis 1+ pitting edema right greater than left in the ankles Neurologic: Speech is halting with frequent word finding difficulty. Motor strength slightly weaker on the left arm and leg cranial nerves intact except subtle flattening of the left nasolabial fold. Sensation intact Lab Results: Basic Metabolic Panel:  Lab 08/04/11 1093 08/03/11 2032  NA 134* 134*  K 3.7 4.2  CL 97 94*  CO2 26 30  GLUCOSE 113* 108*  BUN 31* 36*  CREATININE 1.44* 1.69*  CALCIUM 9.7 9.9  MG -- --  PHOS -- --   Liver Function Tests:  Lab 08/03/11 2032  AST 16  ALT 10  ALKPHOS 83  BILITOT 0.4  PROT 6.7  ALBUMIN 3.6   No results found for this basename: LIPASE:2,AMYLASE:2 in the last 168 hours No results found for this basename: AMMONIA:2 in the last 168 hours CBC:  Lab 08/04/11 0618 08/03/11 2032  WBC 7.2 6.8  NEUTROABS 5.2 4.1  HGB 13.2 13.0  HCT 40.5 39.4  MCV 94.8 96.1  PLT 203 224   Cardiac Enzymes:  Lab 08/03/11 2032  CKTOTAL 66  CKMB 4.5*  CKMBINDEX --  TROPONINI <0.30   BNP: No results found for this basename: POCBNP:3 in the last 168 hours D-Dimer: No results found for  this basename: DDIMER:2 in the last 168 hours CBG: No results found for this basename: GLUCAP:6 in the last 168 hours Hemoglobin A1C: No results found for this basename: HGBA1C in the last 168 hours Fasting Lipid Panel:  Lab 08/04/11 0618  CHOL 214*  HDL 35*  LDLCALC 147*  TRIG 159*  CHOLHDL 6.1  LDLDIRECT --   Thyroid Function Tests: No results found for this basename: TSH,T4TOTAL,FREET4,T3FREE,THYROIDAB in the last 168 hours Anemia Panel: No results found for this basename: VITAMINB12,FOLATE,FERRITIN,TIBC,IRON,RETICCTPCT in the last 168 hours  Alcohol Level:  Lab 08/03/11 2032  ETH <11    Micro Results: No results found for this or any previous visit (from the past 240 hour(s)). Studies/Results: Dg Chest 1 View  08/03/2011  *RADIOLOGY REPORT*  Clinical Data: Altered mental status  CHEST - 1 VIEW  Comparison: 01/31/2010  Findings: Cardiomegaly with prominent mediastinal contours, similar to prior allowing for differences in technique.  Left chest wall battery pack with dual leads, unchanged in position.  Bibasilar linear opacities are likely scarring or atelectasis.  No other focal areas of consolidation.  No pleural effusion or pneumothorax identified.  No acute osseous abnormality.  IMPRESSION: Prominent cardiomediastinal contours.  Bibasilar linear opacities, likely scarring or atelectasis.  Original Report Authenticated By: Greig Castilla  [facility], M.D.   Ct Head Wo Contrast  08/03/2011  *RADIOLOGY REPORT*  Clinical Data: Altered mental status.  Seizures.  Weakness.  CT HEAD WITHOUT CONTRAST  Technique:  Contiguous axial images were obtained from the base of the skull through the vertex without contrast.  Comparison: 11/03/2007  Findings: Diffuse cerebral and cerebellar atrophy.  Focal low attenuation areas of encephalomalacia in the frontal lobes bilaterally, and in the right parietal lobe consistent with old infarcts.  Changes appear stable since the previous study.  Low  attenuation change throughout the deep and periventricular white matter consistent with small vessel ischemic change.  Multiple old lacunar infarcts in the basal ganglia and thalami bilaterally. Mild ventricular dilatation consistent with central atrophy.  No mass effect or midline shift.  No abnormal extra-axial fluid collections.  Gray-white matter junctions are mostly distinct.  No effacement of the basal cisterns.  No evidence of acute intracranial hemorrhage.  No depressed skull fractures.  The mucosal membrane thickening in the maxillary antra.  Postoperative changes suggested in the right maxilla.  Probable old nasal fractures.  Fairly extensive intracranial arterial vascular calcifications with what appears to be high-grade calcific stenosis of the left vertebral artery in the left internal carotid artery.  IMPRESSION: Diffuse atrophy and small vessel ischemic changes with multifocal old infarcts.  Stable appearance since the previous study.  No evidence of acute intracranial hemorrhage, mass lesion, or acute infarct.  Original Report Authenticated By: Marlon Pel, M.D.   Medications: I have reviewed the patient's current medications. Scheduled Meds:   . aspirin EC  325 mg Oral Daily  . doxazosin  1 mg Oral QHS  . enoxaparin  30 mg Subcutaneous Daily  . hydrALAZINE  50 mg Oral Q8H  . labetalol  10 mg Intravenous Once  . labetalol      . metoprolol  50 mg Oral BID  . pantoprazole  80 mg Oral Q1200  . potassium chloride  10 mEq Oral Daily  . torsemide  20 mg Oral Daily  . DISCONTD: aspirin  325 mg Oral Daily   Continuous Infusions:   . DISCONTD: sodium chloride 75 mL/hr at 08/03/11 2350   PRN Meds:.acetaminophen, bisacodyl, docusate sodium, labetalol, ondansetron (ZOFRAN) IV, polyethylene glycol, traMADol, DISCONTD: hydrALAZINE, DISCONTD: metoprolol Assessment/Plan: Principal Problem:  *CVA (cerebral infarction) Active Problems:  Lethargy  HTN (hypertension), malignant   Dehydration  Diastolic heart failure  CKD (chronic kidney disease), stage III  CAD (coronary artery disease)  Patient's baseline is unknown. According to H&P he has chronic left-sided weakness. His left facial droop is nearly resolved. Continue aspirin. Await echocardiogram and carotid Doppler. Patient has been seen by neurology but today it is pending. His blood pressure is poorly controlled and I will resume metoprolol 50 mg by mouth twice a day which is what he takes at home. Also change parameters for hydralazine as needed.   LOS: 1 day   Levelle Edelen L 08/04/2011, 10:59 AM

## 2011-08-04 NOTE — Progress Notes (Signed)
Physical Therapy Evaluation Patient Name: [name] Date: 08/04/2011 Time: 045-409 Charges: 1 eval Problem List:  Patient Active Problem List  Diagnoses  . CVA (cerebral infarction)  . Lethargy  . HTN (hypertension)  . Dehydration  . Diastolic heart failure  . CKD (chronic kidney disease), stage III  . CAD (coronary artery disease)   Past Medical History:  Past Medical History  Diagnosis Date  . Coronary artery disease   . Hypertension   . Pacemaker   . CKD (chronic kidney disease), stage III   . Complete heart block   . GERD (gastroesophageal reflux disease)   . Diastolic heart failure   . Myocardial infarction 2010  . Stroke 1998, 2005, 2009  . Shingles    Past Surgical History:  Past Surgical History  Procedure Date  . Insert / replace / remove pacemaker   . Eye surgery     Precautions/Restrictions  Precautions Precautions: Fall Prior Functioning  Home Living Type of Home: House Lives With: Spouse Home Layout: One level Home Access: Stairs to enter Entrance Stairs-Rails: Right Entrance Stairs-Number of Steps: 3 Bathroom Shower/Tub: Naval architect Equipment: Environmental consultant - four wheeled;Bedside commode/3-in-1 Prior Function Level of Independence: Independent with basic ADLs;Needs assistance with gait Driving: No Comments: Pt reports that him and his wife live alone and have a housekeeper to take care of their home.  He reports that he is able to care for himself and get himself dressed.  he reports that his BP is typically not elevated like it has been over these last few days. He reports he has had 3 strokes and has been affected on the L side each time. He reports he has noticed that over the past month (after recieveing a route canal) that he has been on antibiotics and his eyes have been burning and feels his memory has been declining since then. Cognition Cognition Arousal/Alertness: Awake/alert Overall Cognitive Status: Appears  within functional limits for tasks assessed Sensation/Coordination Sensation Light Touch: Appears Intact Coordination Gross Motor Movements are Fluid and Coordinated: Yes Extremity Assessment RLE Assessment RLE Assessment: Within Functional Limits LLE Assessment LLE Assessment: Exceptions to [facility] LLE Strength LLE Overall Strength Comments: 3+/5 secondary to previous strokes effecting LLE.   Mobility (including Balance) Bed Mobility Bed Mobility: Yes Supine to Sit: 4: Min assist Supine to Sit Details (indicate cue type and reason): with HOB elevated.  Balance Balance Assessed: Yes Dynamic Sitting Balance Dynamic Sitting - Comments: Pt is able to sit EOB with feet supported and back unsupported during entire exam while is comfortably eats his breakfast.  Has mild posteior lean with MMT to LE. Exercise  General Exercises - Lower Extremity Long Arc Quad: AROM;Both;10 reps;Seated Toe Raises: AROM;Both;10 reps;Seated Heel Raises: AROM;Both;10 reps;Seated  End of Session PT - End of Session Activity Tolerance: Treatment limited secondary to medical complications (Comment) (uncontrolled BP sitting EOB) Patient left: in bed;with call bell in reach PT Assessment/Plan/Recommendation PT Assessment Clinical Impression Statement: Pt was referred to PT secondary to weakness.  After examination it was found that the patient is currently greatly limited in his mobility secondary to hypertension.  He is was found today sitting EOB and eating his breakfast without difficulty.   He has decreased ability for processing information and is slow to respond with appropriate responses.   Standing mobility was held  today secondary to hypertension 196/96 sitting EOB.  Willl proceed with increased mobility when BP is controlled PT Recommendation/Assessment: Patient will need skilled PT in  the acute care venue PT Problem List: Decreased strength;Decreased activity tolerance PT Therapy Diagnosis : Generalized  weakness;Difficulty walking PT Plan PT Frequency: Min 5X/week PT Treatment/Interventions: Gait training;Stair training;Therapeutic exercise;Balance training;Other (comment) (Exercises in bed and on EOB until BP is controlled) PT Recommendation Follow Up Recommendations: Home health PT Equipment Recommended: None recommended by PT PT Goals  Acute Rehab PT Goals PT Goal Formulation: With patient Time For Goal Achievement: 5 days Pt will go Supine/Side to Sit: with supervision Pt will Transfer Sit to Stand/Stand to Sit: with supervision Pt will Ambulate: 1 - 15 feet;with least restrictive assistive device;with supervision Pt will Go Up / Down Stairs: 3-5 stairs;with min assist;with rail(s);with least restrictive assistive device [name] 08/04/2011, 8:58 AM

## 2011-08-04 NOTE — Progress Notes (Signed)
*  PRELIMINARY RESULTS* Echocardiogram 2D Echocardiogram has been performed.  Conrad Trumbull 08/04/2011, 10:21 AM

## 2011-08-04 NOTE — Progress Notes (Signed)
Called Dr. Orvan Falconer to inform him that  Patient's blood pressure is still high even after patient received Lopressor IV 5 mg and Hydralazine 50 mg PO. Ordered Hydralazine IV 10 mg for systolic > 210. Will administer Hydralazine IV 10 mg as ordered. Continue monitoring of patient. Nena Polio, RN

## 2011-08-05 MED ORDER — SODIUM CHLORIDE 0.9 % IJ SOLN
INTRAMUSCULAR | Status: AC
  Administered 2011-08-05: 3 mL
  Filled 2011-08-05: qty 3

## 2011-08-05 MED ORDER — DOXAZOSIN MESYLATE 1 MG PO TABS: 2.0000 mg | ORAL_TABLET | Freq: Every day | ORAL | Status: DC

## 2011-08-05 MED ORDER — METOPROLOL TARTRATE 50 MG PO TABS
100.0000 mg | ORAL_TABLET | Freq: Two times a day (BID) | ORAL | Status: DC
  Administered 2011-08-05: 100 mg via ORAL
  Filled 2011-08-05: qty 1
  Filled 2011-08-05: qty 2

## 2011-08-05 MED ORDER — LABETALOL HCL 5 MG/ML IV SOLN
INTRAVENOUS | Status: AC
  Filled 2011-08-05: qty 4

## 2011-08-05 MED ORDER — AMLODIPINE BESYLATE 5 MG PO TABS: 5.0000 mg | ORAL_TABLET | Freq: Every day | ORAL | Status: DC

## 2011-08-05 MED ORDER — DOXAZOSIN MESYLATE 2 MG PO TABS: 2.0000 mg | ORAL_TABLET | Freq: Every day | ORAL | Status: DC

## 2011-08-05 MED ORDER — METOPROLOL TARTRATE 50 MG PO TABS: 50.0000 mg | ORAL_TABLET | Freq: Three times a day (TID) | ORAL | Status: DC

## 2011-08-05 NOTE — Discharge Summary (Addendum)
Physician Discharge Summary  Patient ID: EDAHI KROENING MRN: [id number] DOB/AGE: 11/20/1918 77 y.o.  Admit date: 08/03/2011 Discharge date: 08/05/2011  Discharge Diagnoses:  Principal Problem: confusion  HTN (hypertension), malignant  Diastolic heart failure, compensated  CKD (chronic kidney disease), stage III  CAD (coronary artery disease)   Current Discharge Medication List    CONTINUE these medications which have CHANGED   Details  amLODipine (NORVASC) 5 MG tablet Take 1 tablet (5 mg total) by mouth daily.    doxazosin (CARDURA) 1 MG tablet Take 2 tablets (2 mg total) by mouth at bedtime.    metoprolol (LOPRESSOR) 50 MG tablet Take 1 tablet (50 mg total) by mouth 3 (three) times daily.      CONTINUE these medications which have NOT CHANGED   Details  aspirin 325 MG tablet Take 325 mg by mouth daily.      esomeprazole (NEXIUM) 40 MG capsule Take 40 mg by mouth daily before breakfast.      hydrALAZINE (APRESOLINE) 50 MG tablet Take 50 mg by mouth 3 (three) times daily.      potassium chloride (KLOR-CON) 10 MEQ CR tablet Take 10 mEq by mouth daily.      saw palmetto 160 MG capsule Take 160 mg by mouth daily.      torsemide (DEMADEX) 20 MG tablet Take 20 mg by mouth daily.        STOP taking these medications     aspirin 325 MG buffered tablet      traMADol (ULTRAM) 50 MG tablet         Discharge Orders    Future Orders Please Complete By Expires   Diet - low sodium heart healthy      Increase activity slowly         Disposition: Home or Self Care with home PT and RN  Discharged Condition: stable  Consults: Treatment Team:  Beryle Beams, MD  Follow up:  With Dr. Phillips Odor next week to check blood pressure.  Labs:   Results for orders placed during the hospital encounter of 08/03/11 (from the past 48 hour(s))  CBC     Status: Abnormal   Collection Time   08/03/11  8:32 PM      Component Value Range Comment   WBC 6.8  4.0 - 10.5 (K/uL)    RBC 4.10  (*) 4.22 - 5.81 (MIL/uL)    Hemoglobin 13.0  13.0 - 17.0 (g/dL)    HCT 41.3  24.4 - 01.0 (%)    MCV 96.1  78.0 - 100.0 (fL)    MCH 31.7  26.0 - 34.0 (pg)    MCHC 33.0  30.0 - 36.0 (g/dL)    RDW 27.2  53.6 - 64.4 (%)    Platelets 224  150 - 400 (K/uL)   DIFFERENTIAL     Status: Normal   Collection Time   08/03/11  8:32 PM      Component Value Range Comment   Neutrophils Relative 60  43 - 77 (%)    Neutro Abs 4.1  1.7 - 7.7 (K/uL)    Lymphocytes Relative 26  12 - 46 (%)    Lymphs Abs 1.8  0.7 - 4.0 (K/uL)    Monocytes Relative 11  3 - 12 (%)    Monocytes Absolute 0.8  0.1 - 1.0 (K/uL)    Eosinophils Relative 2  0 - 5 (%)    Eosinophils Absolute 0.2  0.0 - 0.7 (K/uL)    Basophils Relative 1  0 - 1 (%)    Basophils Absolute 0.0  0.0 - 0.1 (K/uL)   CK TOTAL AND CKMB     Status: Abnormal   Collection Time   08/03/11  8:32 PM      Component Value Range Comment   Total CK 66  7 - 232 (U/L)    CK, MB 4.5 (*) 0.3 - 4.0 (ng/mL)    Relative Index RELATIVE INDEX IS INVALID  0.0 - 2.5    COMPREHENSIVE METABOLIC PANEL     Status: Abnormal   Collection Time   08/03/11  8:32 PM      Component Value Range Comment   Sodium 134 (*) 135 - 145 (mEq/L)    Potassium 4.2  3.5 - 5.1 (mEq/L)    Chloride 94 (*) 96 - 112 (mEq/L)    CO2 30  19 - 32 (mEq/L)    Glucose, Bld 108 (*) 70 - 99 (mg/dL)    BUN 36 (*) 6 - 23 (mg/dL)    Creatinine, Ser 4.54 (*) 0.50 - 1.35 (mg/dL)    Calcium 9.9  8.4 - 10.5 (mg/dL)    Total Protein 6.7  6.0 - 8.3 (g/dL)    Albumin 3.6  3.5 - 5.2 (g/dL)    AST 16  0 - 37 (U/L)    ALT 10  0 - 53 (U/L)    Alkaline Phosphatase 83  39 - 117 (U/L)    Total Bilirubin 0.4  0.3 - 1.2 (mg/dL)    GFR calc non Af Amer 34 (*) >90 (mL/min)    GFR calc Af Amer 39 (*) >90 (mL/min)   TROPONIN I     Status: Normal   Collection Time   08/03/11  8:32 PM      Component Value Range Comment   Troponin I <0.30  <0.30 (ng/mL)   ETHANOL     Status: Normal   Collection Time   08/03/11  8:32 PM       Component Value Range Comment   Alcohol, Ethyl (B) <11  0 - 11 (mg/dL)   TSH     Status: Normal   Collection Time   08/03/11  8:32 PM      Component Value Range Comment   TSH 2.069  0.350 - 4.500 (uIU/mL)   URINALYSIS, ROUTINE W REFLEX MICROSCOPIC     Status: Abnormal   Collection Time   08/04/11  3:15 AM      Component Value Range Comment   Color, Urine YELLOW  YELLOW     Appearance CLEAR  CLEAR     Specific Gravity, Urine 1.020  1.005 - 1.030     pH 7.0  5.0 - 8.0     Glucose, UA NEGATIVE  NEGATIVE (mg/dL)    Hgb urine dipstick NEGATIVE  NEGATIVE     Bilirubin Urine NEGATIVE  NEGATIVE     Ketones, ur NEGATIVE  NEGATIVE (mg/dL)    Protein, ur 098 (*) NEGATIVE (mg/dL)    Urobilinogen, UA 0.2  0.0 - 1.0 (mg/dL)    Nitrite NEGATIVE  NEGATIVE     Leukocytes, UA NEGATIVE  NEGATIVE    URINE MICROSCOPIC-ADD ON     Status: Normal   Collection Time   08/04/11  3:15 AM      Component Value Range Comment   WBC, UA    <3 (WBC/hpf)    Value: NO FORMED ELEMENTS SEEN ON URINE MICROSCOPIC EXAMINATION   RBC / HPF    <3 (RBC/hpf)  Value: NO FORMED ELEMENTS SEEN ON URINE MICROSCOPIC EXAMINATION  LIPID PANEL     Status: Abnormal   Collection Time   08/04/11  6:18 AM      Component Value Range Comment   Cholesterol 214 (*) 0 - 200 (mg/dL)    Triglycerides 161 (*) <150 (mg/dL)    HDL 35 (*) >09 (mg/dL)    Total CHOL/HDL Ratio 6.1      VLDL 32  0 - 40 (mg/dL)    LDL Cholesterol 604 (*) 0 - 99 (mg/dL)   BASIC METABOLIC PANEL     Status: Abnormal   Collection Time   08/04/11  6:18 AM      Component Value Range Comment   Sodium 134 (*) 135 - 145 (mEq/L)    Potassium 3.7  3.5 - 5.1 (mEq/L)    Chloride 97  96 - 112 (mEq/L)    CO2 26  19 - 32 (mEq/L)    Glucose, Bld 113 (*) 70 - 99 (mg/dL)    BUN 31 (*) 6 - 23 (mg/dL)    Creatinine, Ser 5.40 (*) 0.50 - 1.35 (mg/dL)    Calcium 9.7  8.4 - 10.5 (mg/dL)    GFR calc non Af Amer 41 (*) >90 (mL/min)    GFR calc Af Amer 48 (*) >90 (mL/min)   CBC      Status: Normal   Collection Time   08/04/11  6:18 AM      Component Value Range Comment   WBC 7.2  4.0 - 10.5 (K/uL)    RBC 4.27  4.22 - 5.81 (MIL/uL)    Hemoglobin 13.2  13.0 - 17.0 (g/dL)    HCT 98.1  19.1 - 47.8 (%)    MCV 94.8  78.0 - 100.0 (fL)    MCH 30.9  26.0 - 34.0 (pg)    MCHC 32.6  30.0 - 36.0 (g/dL)    RDW 29.5  62.1 - 30.8 (%)    Platelets 203  150 - 400 (K/uL)   DIFFERENTIAL     Status: Normal   Collection Time   08/04/11  6:18 AM      Component Value Range Comment   Neutrophils Relative 72  43 - 77 (%)    Neutro Abs 5.2  1.7 - 7.7 (K/uL)    Lymphocytes Relative 18  12 - 46 (%)    Lymphs Abs 1.3  0.7 - 4.0 (K/uL)    Monocytes Relative 9  3 - 12 (%)    Monocytes Absolute 0.6  0.1 - 1.0 (K/uL)    Eosinophils Relative 1  0 - 5 (%)    Eosinophils Absolute 0.1  0.0 - 0.7 (K/uL)    Basophils Relative 0  0 - 1 (%)    Basophils Absolute 0.0  0.0 - 0.1 (K/uL)     Diagnostics:  Dg Chest 1 View  08/03/2011  *RADIOLOGY REPORT*  Clinical Data: Altered mental status  CHEST - 1 VIEW  Comparison: 01/31/2010  Findings: Cardiomegaly with prominent mediastinal contours, similar to prior allowing for differences in technique.  Left chest wall battery pack with dual leads, unchanged in position.  Bibasilar linear opacities are likely scarring or atelectasis.  No other focal areas of consolidation.  No pleural effusion or pneumothorax identified.  No acute osseous abnormality.  IMPRESSION: Prominent cardiomediastinal contours.  Bibasilar linear opacities, likely scarring or atelectasis.  Original Report Authenticated By: Waneta Martins, M.D.   Ct Head Wo Contrast  08/03/2011  *RADIOLOGY REPORT*  Clinical  Data: Altered mental status.  Seizures.  Weakness.  CT HEAD WITHOUT CONTRAST  Technique:  Contiguous axial images were obtained from the base of the skull through the vertex without contrast.  Comparison: 11/03/2007  Findings: Diffuse cerebral and cerebellar atrophy.  Focal low attenuation  areas of encephalomalacia in the frontal lobes bilaterally, and in the right parietal lobe consistent with old infarcts.  Changes appear stable since the previous study.  Low attenuation change throughout the deep and periventricular white matter consistent with small vessel ischemic change.  Multiple old lacunar infarcts in the basal ganglia and thalami bilaterally. Mild ventricular dilatation consistent with central atrophy.  No mass effect or midline shift.  No abnormal extra-axial fluid collections.  Gray-white matter junctions are mostly distinct.  No effacement of the basal cisterns.  No evidence of acute intracranial hemorrhage.  No depressed skull fractures.  The mucosal membrane thickening in the maxillary antra.  Postoperative changes suggested in the right maxilla.  Probable old nasal fractures.  Fairly extensive intracranial arterial vascular calcifications with what appears to be high-grade calcific stenosis of the left vertebral artery in the left internal carotid artery.  IMPRESSION: Diffuse atrophy and small vessel ischemic changes with multifocal old infarcts.  Stable appearance since the previous study.  No evidence of acute intracranial hemorrhage, mass lesion, or acute infarct.  Original Report Authenticated By: Marlon Pel, M.D.   US Carotid Duplex Bilateral  08/04/2011  *RADIOLOGY REPORT*  Clinical Data: Left facial weakness, history coronary artery disease, hypertension, MI, stroke, syncope  BILATERAL CAROTID DUPLEX ULTRASOUND  Technique: Wallace Cullens scale imaging, color Doppler and duplex ultrasound was performed of bilateral carotid and vertebral arteries in the neck.  Comparison:  11/03/2007  Criteria:  Quantification of carotid stenosis is based on velocity parameters that correlate the residual internal carotid diameter with NASCET-based stenosis levels, using the diameter of the distal internal carotid lumen as the denominator for stenosis measurement.  The following velocity  measurements were obtained:                   PEAK SYSTOLIC/END DIASTOLIC RIGHT ICA:                        66/21cm/sec CCA:                        49/10cm/sec SYSTOLIC ICA/CCA RATIO:     1.35 DIASTOLIC ICA/CCA RATIO:    2.12 ECA:                        109cm/sec  LEFT ICA:                        82/22cm/sec CCA:                        50/17cm/sec SYSTOLIC ICA/CCA RATIO:     1.65 DIASTOLIC ICA/CCA RATIO:    1.89 ECA:                        83cm/sec  Findings:  RIGHT CAROTID ARTERY: Mildly tortuous right carotid system.  Plaque right CCA and right carotid bulb extending the right ICA. Minimally turbulent flow proximal right ICA on color Doppler imaging with spectral broadening on waveform analysis.  No high velocity jets.  RIGHT VERTEBRAL ARTERY:  Patent, antegrade  LEFT CAROTID ARTERY: Plaque at distal  left CCA extending left carotid bulb and proximal left ICA.  Minimal plaque, noncalcified, at proximal left ECA.  Turbulent blood flow left carotid bifurcation on color Doppler imaging, extending into left ICA, which is mildly tortuous.  Spectral broadening left ICA on waveform analysis.  Question of an ulcerated plaque is identified at the left carotid bulb, where a small focus of color blood flow is seen deep to plaque, images 72-75.  No high velocity jets.  LEFT VERTEBRAL ARTERY:  Patent, antegrade  IMPRESSION: Plaque formation in the carotid systems bilaterally at the distal common carotid arteries, carotid bulbs and proximal internal carotid arteries. By velocity measurements, observed plaques correspond to the less than 50% diameter stenoses bilaterally. Questionable small plaque ulceration however is seen in the left carotid bulb; recommend CTA imaging of the neck with contrast to evaluate.  Original Report Authenticated By: Lollie Marrow, M.D.   EKG: NSR, LAFB, LVH  Echo:   - Left ventricle: The cavity size was normal. There was moderate concentric hypertrophy. Systolic function was vigorous. The  estimated ejection fraction was in the range of 65% to 70%. Wall motion was normal; there were no regional wall motion abnormalities. Doppler parameters are consistent with abnormal left ventricular relaxation (grade 1 diastolic dysfunction). Doppler parameters are consistent with high ventricular filling pressure. - Aortic valve: There was mild stenosis. Trivial regurgitation. Valve area: 1.79cm^2(VTI). Valve area: 1.67cm^2 (Vmax). - Mitral valve: Calcified annulus. Mild regurgitation. - Left atrium: The atrium was moderately dilated.   Full Code   Hospital Course:  See H and P for admission details.  The patient is a 77 year old white mail who was brought to the ED with an episode of confusion and left facial droop.  His blood pressure was 200/100 in the ER.  He was oriented. Left facial droop. He has chronic left sided weakness from previous stroke.  He has a pacemaker, so unable to get an MRI.  Was admitted to telemetry. A stroke workup was negative. EEG was normal. Neurology was consulted. His facial droop and confusion resolved. His blood pressure has been difficult to control. It's unclear which medications patient is actually taking at home. It sounds as if he's not very compliant with his antihypertensives. Neurology feels that this episode may have been a seizure. The patient had been started on tramadol recently which may have lowered his seizure threshold. This should be stopped. No anti-epileptic drugs are recommended at this time. At theTime of discharge he is back to baseline.  Antihypertensives have been adjusted, but the patient is very resistant to any home medication changes. I've asked home physical therapy and nursing to get involved, attention to medication compliance and blood pressure. He will need to followup with his primary care physician next week for blood pressure management. Total discharge is greater than 30 minutes.   Blood pressure 151/80, pulse 71, temperature 97  F (36.1 C), temperature source Oral, resp. rate 18, height 5\' 10"  (1.778 m), weight 92 kg (202 lb 13.2 oz), SpO2 96.00%.  Unchanged from 08/04/11   Signed: Crista Curb L 08/05/2011, 10:32 AM

## 2011-08-05 NOTE — Progress Notes (Signed)
Subjective: Interval History: No new complaints. No recurrent spell. EEGs reviewed and shows mild generalized slowing but no epileptiform activity.  Objective: Vital signs in last 24 hours: Temp:  [97 F (36.1 C)-98.3 F (36.8 C)] 97 F (36.1 C) (10/09 0610) Pulse Rate:  [60-72] 71  (10/09 0610) Resp:  [18-20] 18  (10/09 0610) BP: (161-222)/(74-114) 199/102 mmHg (10/09 0726) SpO2:  [92 %-96 %] 96 % (10/09 0610)  Intake/Output from previous day: 10/08 0701 - 10/09 0700 In: 480 [P.O.:480] Out: 600 [Urine:600] Intake/Output this shift:   Nutritional status: Cardiac  PE HEENT: Supple. Atraumatic normocephalic.  ABDOMEN: soft  EXTREMITIES: No edema  BACK: Normal.  SKIN: Normal by inspection.  MENTAL STATUS: The patient is alert. He is mostly lucid and responses to commands.  Speech is normal. Cognition seems generally unremarkable. He is lucid.  CRANIAL NERVES: Pupils are equal, round and reactive to light and accomodation; extra ocular movements are full, there is no significant nystagmus; visual fields are full; upper and lower facial muscles are normal in strength and symmetric, there is no flattening of the nasolabial folds.  MOTOR: The patient has a mild left hemiparesis with spasticity. The left upper extremity has pretty good strength except for hand grip of 4+. Other muscle groups of the left upper extremity is 5. There is mild spasticity of the left upper extremity. There is a mild pronator drift of the left upper extremity. Left lower extremity is weaker graded as 4/5 proximally and distally 3/5 with dorsiflexion. Again tone is more increased in the left lower extremity. Right side shows normal tone, bulk and strength.  COORDINATION: Left finger to nose is normal, right finger to nose is normal, No rest tremor; no intention tremor; no postural tremor; no bradykinesia.    Lab Results:  [facility] 08/04/11 0618 08/03/11 2032  WBC 7.2 6.8  HGB 13.2 13.0  HCT 40.5 39.4  PLT 203  224  NA 134* 134*  K 3.7 4.2  CL 97 94*  CO2 26 30  GLUCOSE 113* 108*  BUN 31* 36*  CREATININE 1.44* 1.69*  CALCIUM 9.7 9.9  LABA1C -- --   Lipid Panel  Basename 08/04/11 0618  CHOL 214*  TRIG 159*  HDL 35*  CHOLHDL 6.1  VLDL 32  LDLCALC 161*    Studies/Results: Dg Chest 1 View  08/03/2011  *RADIOLOGY REPORT*  Clinical Data: Altered mental status  CHEST - 1 VIEW  Comparison: 01/31/2010  Findings: Cardiomegaly with prominent mediastinal contours, similar to prior allowing for differences in technique.  Left chest wall battery pack with dual leads, unchanged in position.  Bibasilar linear opacities are likely scarring or atelectasis.  No other focal areas of consolidation.  No pleural effusion or pneumothorax identified.  No acute osseous abnormality.  IMPRESSION: Prominent cardiomediastinal contours.  Bibasilar linear opacities, likely scarring or atelectasis.  Original Report Authenticated By: Waneta Martins, M.D.   Ct Head Wo Contrast  08/03/2011  *RADIOLOGY REPORT*  Clinical Data: Altered mental status.  Seizures.  Weakness.  CT HEAD WITHOUT CONTRAST  Technique:  Contiguous axial images were obtained from the base of the skull through the vertex without contrast.  Comparison: 11/03/2007  Findings: Diffuse cerebral and cerebellar atrophy.  Focal low attenuation areas of encephalomalacia in the frontal lobes bilaterally, and in the right parietal lobe consistent with old infarcts.  Changes appear stable since the previous study.  Low attenuation change throughout the deep and periventricular white matter consistent with small vessel ischemic change.  Multiple  old lacunar infarcts in the basal ganglia and thalami bilaterally. Mild ventricular dilatation consistent with central atrophy.  No mass effect or midline shift.  No abnormal extra-axial fluid collections.  Gray-white matter junctions are mostly distinct.  No effacement of the basal cisterns.  No evidence of acute intracranial  hemorrhage.  No depressed skull fractures.  The mucosal membrane thickening in the maxillary antra.  Postoperative changes suggested in the right maxilla.  Probable old nasal fractures.  Fairly extensive intracranial arterial vascular calcifications with what appears to be high-grade calcific stenosis of the left vertebral artery in the left internal carotid artery.  IMPRESSION: Diffuse atrophy and small vessel ischemic changes with multifocal old infarcts.  Stable appearance since the previous study.  No evidence of acute intracranial hemorrhage, mass lesion, or acute infarct.  Original Report Authenticated By: Marlon Pel, M.D.   US Carotid Duplex Bilateral  08/04/2011  *RADIOLOGY REPORT*  Clinical Data: Left facial weakness, history coronary artery disease, hypertension, MI, stroke, syncope  BILATERAL CAROTID DUPLEX ULTRASOUND  Technique: Wallace Cullens scale imaging, color Doppler and duplex ultrasound was performed of bilateral carotid and vertebral arteries in the neck.  Comparison:  11/03/2007  Criteria:  Quantification of carotid stenosis is based on velocity parameters that correlate the residual internal carotid diameter with NASCET-based stenosis levels, using the diameter of the distal internal carotid lumen as the denominator for stenosis measurement.  The following velocity measurements were obtained:                   PEAK SYSTOLIC/END DIASTOLIC RIGHT ICA:                        66/21cm/sec CCA:                        49/10cm/sec SYSTOLIC ICA/CCA RATIO:     1.35 DIASTOLIC ICA/CCA RATIO:    2.12 ECA:                        109cm/sec  LEFT ICA:                        82/22cm/sec CCA:                        50/17cm/sec SYSTOLIC ICA/CCA RATIO:     1.65 DIASTOLIC ICA/CCA RATIO:    1.89 ECA:                        83cm/sec  Findings:  RIGHT CAROTID ARTERY: Mildly tortuous right carotid system.  Plaque right CCA and right carotid bulb extending the right ICA. Minimally turbulent flow proximal right ICA on  color Doppler imaging with spectral broadening on waveform analysis.  No high velocity jets.  RIGHT VERTEBRAL ARTERY:  Patent, antegrade  LEFT CAROTID ARTERY: Plaque at distal left CCA extending left carotid bulb and proximal left ICA.  Minimal plaque, noncalcified, at proximal left ECA.  Turbulent blood flow left carotid bifurcation on color Doppler imaging, extending into left ICA, which is mildly tortuous.  Spectral broadening left ICA on waveform analysis.  Question of an ulcerated plaque is identified at the left carotid bulb, where a small focus of color blood flow is seen deep to plaque, images 72-75.  No high velocity jets.  LEFT VERTEBRAL ARTERY:  Patent, antegrade  IMPRESSION: Plaque formation in the  carotid systems bilaterally at the distal common carotid arteries, carotid bulbs and proximal internal carotid arteries. By velocity measurements, observed plaques correspond to the less than 50% diameter stenoses bilaterally. Questionable small plaque ulceration however is seen in the left carotid bulb; recommend CTA imaging of the neck with contrast to evaluate.  Original Report Authenticated By: Lollie Marrow, M.D.    Medications:  Scheduled Meds:   . aspirin EC  325 mg Oral Daily  . doxazosin  2 mg Oral QHS  . enoxaparin  30 mg Subcutaneous Daily  . hydrALAZINE  50 mg Oral Q8H  . metoprolol  100 mg Oral BID  . pantoprazole  80 mg Oral Q1200  . potassium chloride  10 mEq Oral Daily  . sodium chloride      . sodium chloride      . torsemide  20 mg Oral Daily  . DISCONTD: aspirin  325 mg Oral Daily  . DISCONTD: doxazosin  1 mg Oral QHS  . DISCONTD: metoprolol  50 mg Oral BID   Continuous Infusions:   . DISCONTD: sodium chloride 75 mL/hr at 08/03/11 2350   PRN Meds:.acetaminophen, bisacodyl, docusate sodium, labetalol, ondansetron (ZOFRAN) IV, polyethylene glycol, traMADol, DISCONTD: hydrALAZINE, DISCONTD: metoprolol   Assessment/Plan: Acute confusional state/altered mentation that  resolved spontaneously. The patient's EEG is normal. Head CT scan shows chronic cortical infarct but nothing acute. 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 He reports that he was started on Ultram shortly before coming to the hospital. This could be the culprit. Certainly given the patient old record infarcts, there is increased risk of seizures. As EEG is normal, I would not treat him with antiepileptic medications at this time. If he has recurrent unexplained spells, that may be the time to treat him. Although the differential includes an acute stroke, with suspected this with stroke on the CT scan if it caused her that much confusion. Continue with current antiplatelet agents.   LOS: 2 days   [name]

## 2011-08-05 NOTE — Procedures (Signed)
NAME:  [name], [name] NO.:  [id number]  MEDICAL RECORD NO.:  [id number]  LOCATION:  EE                           FACILITY:  [facility]  PHYSICIAN:  Yousaf Sainato A. Gerilyn Pilgrim, M.D. DATE OF BIRTH:  11-Mar-1919  DATE OF PROCEDURE:  08/04/2011 DATE OF DISCHARGE:  08/04/2011                             EEG INTERPRETATION   REFERRING PHYSICIAN:  [facility], Dr. Lendell Caprice.  INDICATIONS:  This is a 77 year old man who presents with a spell of altered mentation, confusion, and amnesia.  The study is being done to evaluate for seizures.  MEDICATIONS:  Aspirin, Cardura, Nexium, Lopressor, potassium, Demadex, and Ultram.  ANALYSIS:  A 16-channel recording using standard 10-20 measurements is conducted for approximately 20 minutes.  There is a posterior rhythm, I guess as high as 7 Hz.  There is beta activity observed in the frontal areas.  Awake and drowsy activities are recorded.  Photic stimulation and hyperventilation were not carried out.  There is no clear lateralized or focal slowing.  There is no clear epileptiform activity observed.  IMPRESSION:  Mild generalized slowing, otherwise unremarkable study. There is no epileptiform activity.  The patient is noted to be on Ultram, which has been associated with seizures. Consider discontinuing this medication.     Daphna Lafuente A. Gerilyn Pilgrim, M.D.     KAD/MEDQ  D:  08/05/2011  T:  08/05/2011  Job:  [id number]

## 2011-08-05 NOTE — Progress Notes (Signed)
Pt d/c home today arranged ahc for PT.

## 2011-11-10 ENCOUNTER — Other Ambulatory Visit: Payer: Self-pay

## 2011-11-10 ENCOUNTER — Encounter (HOSPITAL_COMMUNITY): Payer: Self-pay

## 2011-11-10 ENCOUNTER — Emergency Department (HOSPITAL_COMMUNITY): Payer: Medicare Other

## 2011-11-10 ENCOUNTER — Inpatient Hospital Stay (HOSPITAL_COMMUNITY)
Admission: EM | Admit: 2011-11-10 | Discharge: 2011-11-13 | DRG: 683 | Disposition: A | Discharge status: Skilled Nursing Facility | Payer: Medicare Other | Attending: Internal Medicine | Admitting: Internal Medicine

## 2011-11-10 DIAGNOSIS — Z79899 Other long term (current) drug therapy: Secondary | ICD-10-CM

## 2011-11-10 DIAGNOSIS — I1310 Hypertensive heart and chronic kidney disease without heart failure, with stage 1 through stage 4 chronic kidney disease, or unspecified chronic kidney disease: Secondary | ICD-10-CM

## 2011-11-10 DIAGNOSIS — R5381 Other malaise: Secondary | ICD-10-CM | POA: Diagnosis present

## 2011-11-10 DIAGNOSIS — I129 Hypertensive chronic kidney disease with stage 1 through stage 4 chronic kidney disease, or unspecified chronic kidney disease: Secondary | ICD-10-CM | POA: Diagnosis present

## 2011-11-10 DIAGNOSIS — N179 Acute kidney failure, unspecified: Principal | ICD-10-CM | POA: Diagnosis present

## 2011-11-10 DIAGNOSIS — R5383 Other fatigue: Secondary | ICD-10-CM

## 2011-11-10 DIAGNOSIS — K5900 Constipation, unspecified: Secondary | ICD-10-CM | POA: Diagnosis present

## 2011-11-10 DIAGNOSIS — E860 Dehydration: Secondary | ICD-10-CM | POA: Diagnosis present

## 2011-11-10 DIAGNOSIS — I639 Cerebral infarction, unspecified: Secondary | ICD-10-CM

## 2011-11-10 DIAGNOSIS — J019 Acute sinusitis, unspecified: Secondary | ICD-10-CM | POA: Diagnosis present

## 2011-11-10 DIAGNOSIS — I509 Heart failure, unspecified: Secondary | ICD-10-CM | POA: Diagnosis present

## 2011-11-10 DIAGNOSIS — N183 Chronic kidney disease, stage 3 unspecified: Secondary | ICD-10-CM | POA: Diagnosis present

## 2011-11-10 DIAGNOSIS — I2510 Atherosclerotic heart disease of native coronary artery without angina pectoris: Secondary | ICD-10-CM | POA: Diagnosis present

## 2011-11-10 DIAGNOSIS — R531 Weakness: Secondary | ICD-10-CM

## 2011-11-10 DIAGNOSIS — N184 Chronic kidney disease, stage 4 (severe): Secondary | ICD-10-CM | POA: Diagnosis present

## 2011-11-10 DIAGNOSIS — I10 Essential (primary) hypertension: Secondary | ICD-10-CM

## 2011-11-10 DIAGNOSIS — Z7982 Long term (current) use of aspirin: Secondary | ICD-10-CM

## 2011-11-10 DIAGNOSIS — I5032 Chronic diastolic (congestive) heart failure: Secondary | ICD-10-CM | POA: Diagnosis present

## 2011-11-10 DIAGNOSIS — R600 Localized edema: Secondary | ICD-10-CM

## 2011-11-10 LAB — CBC
HCT: 37.4 % — ABNORMAL LOW (ref 39.0–52.0)
MCHC: 33.4 g/dL (ref 30.0–36.0)
MCV: 92.8 fL (ref 78.0–100.0)
Platelets: 175 10*3/uL (ref 150–400)
RDW: 13.1 % (ref 11.5–15.5)

## 2011-11-10 LAB — DIFFERENTIAL
Basophils Absolute: 0 10*3/uL (ref 0.0–0.1)
Basophils Relative: 0 % (ref 0–1)
Eosinophils Absolute: 0.3 10*3/uL (ref 0.0–0.7)
Eosinophils Relative: 6 % — ABNORMAL HIGH (ref 0–5)
Monocytes Absolute: 0.7 10*3/uL (ref 0.1–1.0)

## 2011-11-10 LAB — CARDIAC PANEL(CRET KIN+CKTOT+MB+TROPI): Total CK: 63 U/L (ref 7–232)

## 2011-11-10 LAB — BASIC METABOLIC PANEL
Calcium: 9.7 mg/dL (ref 8.4–10.5)
Creatinine, Ser: 2.34 mg/dL — ABNORMAL HIGH (ref 0.50–1.35)
GFR calc Af Amer: 27 mL/min — ABNORMAL LOW (ref >90)
GFR calc non Af Amer: 23 mL/min — ABNORMAL LOW (ref >90)

## 2011-11-10 LAB — URINALYSIS, ROUTINE W REFLEX MICROSCOPIC
Bilirubin Urine: NEGATIVE
Glucose, UA: NEGATIVE mg/dL
Ketones, ur: NEGATIVE mg/dL
Leukocytes, UA: NEGATIVE
pH: 6 (ref 5.0–8.0)

## 2011-11-10 MED ORDER — SODIUM CHLORIDE 0.9 % IV SOLN
INTRAVENOUS | Status: AC
  Administered 2011-11-10: 500 mL via INTRAVENOUS
  Administered 2011-11-11: 01:00:00 via INTRAVENOUS

## 2011-11-10 MED ORDER — ALBUTEROL SULFATE (5 MG/ML) 0.5% IN NEBU: 2.5000 mg | INHALATION_SOLUTION | RESPIRATORY_TRACT | Status: DC | PRN

## 2011-11-10 MED ORDER — TRAZODONE HCL 50 MG PO TABS: 25.0000 mg | ORAL_TABLET | Freq: Every evening | ORAL | Status: DC | PRN

## 2011-11-10 MED ORDER — ALBUTEROL SULFATE (5 MG/ML) 0.5% IN NEBU
2.5000 mg | INHALATION_SOLUTION | Freq: Once | RESPIRATORY_TRACT | Status: AC
  Administered 2011-11-10: 2.5 mg via RESPIRATORY_TRACT
  Filled 2011-11-10: qty 0.5

## 2011-11-10 MED ORDER — METOPROLOL TARTRATE 50 MG PO TABS
50.0000 mg | ORAL_TABLET | Freq: Two times a day (BID) | ORAL | Status: DC
  Administered 2011-11-10 – 2011-11-13 (×6): 50 mg via ORAL
  Filled 2011-11-10 (×6): qty 1

## 2011-11-10 MED ORDER — ACETAMINOPHEN 325 MG PO TABS: 650.0000 mg | ORAL_TABLET | Freq: Four times a day (QID) | ORAL | Status: DC | PRN

## 2011-11-10 MED ORDER — ALUM & MAG HYDROXIDE-SIMETH 200-200-20 MG/5ML PO SUSP: 30.0000 mL | Freq: Four times a day (QID) | ORAL | Status: DC | PRN

## 2011-11-10 MED ORDER — FERROUS SULFATE 325 (65 FE) MG PO TABS
325.0000 mg | ORAL_TABLET | Freq: Every day | ORAL | Status: DC
  Administered 2011-11-10 – 2011-11-13 (×4): 325 mg via ORAL
  Filled 2011-11-10 (×4): qty 1

## 2011-11-10 MED ORDER — ASPIRIN 325 MG PO TABS
325.0000 mg | ORAL_TABLET | Freq: Every day | ORAL | Status: DC
  Administered 2011-11-10 – 2011-11-13 (×4): 325 mg via ORAL
  Filled 2011-11-10 (×4): qty 1

## 2011-11-10 MED ORDER — ENOXAPARIN SODIUM 40 MG/0.4ML ~~LOC~~ SOLN
40.0000 mg | Freq: Every day | SUBCUTANEOUS | Status: DC
  Administered 2011-11-10 – 2011-11-13 (×4): 40 mg via SUBCUTANEOUS
  Filled 2011-11-10 (×4): qty 0.4

## 2011-11-10 MED ORDER — AMLODIPINE BESYLATE 5 MG PO TABS
5.0000 mg | ORAL_TABLET | Freq: Every day | ORAL | Status: DC
  Administered 2011-11-10 – 2011-11-12 (×3): 5 mg via ORAL
  Filled 2011-11-10 (×3): qty 1

## 2011-11-10 MED ORDER — SENNOSIDES-DOCUSATE SODIUM 8.6-50 MG PO TABS: 1.0000 | ORAL_TABLET | Freq: Every evening | ORAL | Status: DC | PRN

## 2011-11-10 MED ORDER — AMOXICILLIN 250 MG PO CAPS
500.0000 mg | ORAL_CAPSULE | Freq: Three times a day (TID) | ORAL | Status: DC
  Administered 2011-11-10 – 2011-11-12 (×8): 500 mg via ORAL
  Filled 2011-11-10: qty 1
  Filled 2011-11-10 (×2): qty 2
  Filled 2011-11-10: qty 1
  Filled 2011-11-10: qty 2
  Filled 2011-11-10: qty 1
  Filled 2011-11-10 (×3): qty 2

## 2011-11-10 MED ORDER — PANTOPRAZOLE SODIUM 40 MG PO TBEC
40.0000 mg | DELAYED_RELEASE_TABLET | Freq: Every day | ORAL | Status: DC
  Administered 2011-11-10 – 2011-11-13 (×4): 40 mg via ORAL
  Filled 2011-11-10 (×4): qty 1

## 2011-11-10 MED ORDER — VITAMIN B-12 1000 MCG PO TABS
500.0000 ug | ORAL_TABLET | Freq: Every day | ORAL | Status: DC
  Administered 2011-11-10 – 2011-11-13 (×4): 500 ug via ORAL
  Filled 2011-11-10 (×5): qty 0.5

## 2011-11-10 MED ORDER — FLUTICASONE PROPIONATE 50 MCG/ACT NA SUSP
2.0000 | Freq: Every day | NASAL | Status: DC
  Administered 2011-11-10 – 2011-11-13 (×4): 2 via NASAL
  Filled 2011-11-10: qty 16

## 2011-11-10 MED ORDER — SODIUM CHLORIDE 0.9 % IV SOLN: INTRAVENOUS | Status: DC

## 2011-11-10 MED ORDER — POTASSIUM CHLORIDE CRYS ER 10 MEQ PO TBCR
10.0000 meq | EXTENDED_RELEASE_TABLET | Freq: Once | ORAL | Status: AC
  Administered 2011-11-10: 10 meq via ORAL
  Filled 2011-11-10: qty 1

## 2011-11-10 MED ORDER — ACETAMINOPHEN 650 MG RE SUPP: 650.0000 mg | Freq: Four times a day (QID) | RECTAL | Status: DC | PRN

## 2011-11-10 NOTE — Progress Notes (Signed)
Attempted to call nurse back to get report. Nurse not available to give report.

## 2011-11-10 NOTE — ED Notes (Signed)
Beeped Hospitalist to [phone].Dr. Lendell Caprice

## 2011-11-10 NOTE — ED Notes (Signed)
Complain of weak and dizzy

## 2011-11-10 NOTE — ED Notes (Signed)
Pt uses a walker for ambulation at home. Daughter states pt cannot ambulate at all now due to weakness in legs

## 2011-11-10 NOTE — ED Notes (Signed)
Pt states he feet and legs are swollen. States they swell all the time but are worse today. Also, complain of low back that he has had for a while

## 2011-11-10 NOTE — ED Provider Notes (Addendum)
History     CSN: [id number]  Arrival date & time 11/10/11  7829   First MD Initiated Contact with Patient 11/10/11 [phone]      Chief Complaint  Patient presents with  . Hypertension    (Consider location/radiation/quality/duration/timing/severity/associated sxs/prior treatment) HPI [name] is a 78 y.o. male, with a h/o CAD, pacemaker, stroke x 3 with left sided weakness, hypertension and GERD who presents to the Emergency Department complaining of ongoing cough that he has had for a week. He also reports that he has had weakness that got acutely worse list night after running a fever. States his legs are more swollen than usual. He was wheezing earlier to day.  Has continued to use the medicines given him by his doctor. No additional medicines have been added recently. He is accompanied by his daughter.  PCP  Dr. Sherwood Gambler  Past Medical History  Diagnosis Date  . Coronary artery disease   . Hypertension   . Pacemaker   . CKD (chronic kidney disease), stage III   . Complete heart block   . GERD (gastroesophageal reflux disease)   . Diastolic heart failure   . Myocardial infarction 2010  . Stroke 1998, 2005, 2009  . Shingles     Past Surgical History  Procedure Date  . Insert / replace / remove pacemaker   . Eye surgery     History reviewed. No pertinent family history.  History  Substance Use Topics  . Smoking status: Never Smoker   . Smokeless tobacco: Not on file  . Alcohol Use: Yes      Review of Systems 10 Systems reviewed and are negative for acute change except as noted in the HPI. Allergies  Review of patient's allergies indicates no known allergies.  Home Medications   Current Outpatient Rx  Name Route Sig Dispense Refill  . AMLODIPINE BESYLATE 5 MG PO TABS Oral Take 1 tablet (5 mg total) by mouth daily.    . ASPIRIN 325 MG PO TABS Oral Take 325 mg by mouth daily.      Marland Kitchen DOXAZOSIN MESYLATE 1 MG PO TABS Oral Take 2 tablets (2 mg total) by mouth  at bedtime.    Marland Kitchen ESOMEPRAZOLE MAGNESIUM 40 MG PO CPDR Oral Take 40 mg by mouth daily before breakfast.      . HYDRALAZINE HCL 50 MG PO TABS Oral Take 50 mg by mouth 3 (three) times daily.      Marland Kitchen METOPROLOL TARTRATE 50 MG PO TABS Oral Take 1 tablet (50 mg total) by mouth 3 (three) times daily.    Marland Kitchen POTASSIUM CHLORIDE 10 MEQ PO TBCR Oral Take 10 mEq by mouth daily.      . SAW PALMETTO (SERENOA REPENS) 160 MG PO CAPS Oral Take 160 mg by mouth daily.      . TORSEMIDE 20 MG PO TABS Oral Take 20 mg by mouth daily.        BP 188/115  Pulse 67  Temp(Src) 98.4 F (36.9 C) (Oral)  Resp 20  SpO2 92%  Physical Exam  Nursing note and vitals reviewed. Constitutional: He is oriented to person, place, and time. He appears well-developed and well-nourished.  HENT:  Head: Normocephalic and atraumatic.  Right Ear: External ear normal.  Left Ear: External ear normal.  Mouth/Throat: Oropharynx is clear and moist.  Eyes: EOM are normal. Pupils are equal, round, and reactive to light.  Neck: Normal range of motion.  Cardiovascular: Normal rate, normal heart sounds and intact distal  pulses.   Pulmonary/Chest: No respiratory distress.  Abdominal: Soft.  Musculoskeletal: He exhibits edema.       2+ non pitting edema, right greater than left  Neurological: He is alert and oriented to person, place, and time.  Skin: Skin is warm and dry.    ED Course  Procedures (including critical care time) Results for orders placed during the hospital encounter of 11/10/11  CBC      Component Value Range   WBC 6.0  4.0 - 10.5 (K/uL)   RBC 4.03 (*) 4.22 - 5.81 (MIL/uL)   Hemoglobin 12.5 (*) 13.0 - 17.0 (g/dL)   HCT 09.8 (*) 11.9 - 52.0 (%)   MCV 92.8  78.0 - 100.0 (fL)   MCH 31.0  26.0 - 34.0 (pg)   MCHC 33.4  30.0 - 36.0 (g/dL)   RDW 14.7  82.9 - 56.2 (%)   Platelets 175  150 - 400 (K/uL)  DIFFERENTIAL      Component Value Range   Neutrophils Relative 58  43 - 77 (%)   Neutro Abs 3.5  1.7 - 7.7 (K/uL)    Lymphocytes Relative 25  12 - 46 (%)   Lymphs Abs 1.5  0.7 - 4.0 (K/uL)   Monocytes Relative 11  3 - 12 (%)   Monocytes Absolute 0.7  0.1 - 1.0 (K/uL)   Eosinophils Relative 6 (*) 0 - 5 (%)   Eosinophils Absolute 0.3  0.0 - 0.7 (K/uL)   Basophils Relative 0  0 - 1 (%)   Basophils Absolute 0.0  0.0 - 0.1 (K/uL)  BASIC METABOLIC PANEL      Component Value Range   Sodium 138  135 - 145 (mEq/L)   Potassium 3.9  3.5 - 5.1 (mEq/L)   Chloride 104  96 - 112 (mEq/L)   CO2 27  19 - 32 (mEq/L)   Glucose, Bld 102 (*) 70 - 99 (mg/dL)   BUN 31 (*) 6 - 23 (mg/dL)   Creatinine, Ser 1.30 (*) 0.50 - 1.35 (mg/dL)   Calcium 9.7  8.4 - 86.5 (mg/dL)   GFR calc non Af Amer 23 (*) >90 (mL/min)   GFR calc Af Amer 27 (*) >90 (mL/min)  CARDIAC PANEL(CRET KIN+CKTOT+MB+TROPI)      Component Value Range   Total CK 63  7 - 232 (U/L)   CK, MB 3.5  0.3 - 4.0 (ng/mL)   Troponin I <0.30  <0.30 (ng/mL)   Relative Index RELATIVE INDEX IS INVALID  0.0 - 2.5   PRO B NATRIURETIC PEPTIDE      Component Value Range   Pro B Natriuretic peptide (BNP) 2808.0 (*) 0 - 450 (pg/mL)    Dg Chest Portable 1 View  11/10/2011  *RADIOLOGY REPORT*  Clinical Data: Weakness, shortness of breath.  PORTABLE CHEST - 1 VIEW  Comparison: 08/03/2011  Findings: Left pacer remains in place, unchanged.  Cardiomegaly. Bibasilar airspace opacities, likely atelectasis.  No effusions or acute bony abnormality.  IMPRESSION: Cardiomegaly.  Bibasilar opacities, likely atelectasis.  Original Report Authenticated By: Cyndie Chime, M.D.    Date: 11/10/2011  814  Rate: 62  Rhythm: normal sinus rhythm and 1st degree AV block  QRS Axis: normal  Intervals: normal  ST/T Wave abnormalities: normal  Conduction Disutrbances:first-degree A-V block  and left anterior fascicular block  Narrative Interpretation:   Old EKG Reviewed: unchanged c/w 08/03/11 11:11-Spoke with Dr. Lendell Caprice, hospitalist who will admit the patient. Requested med surg bed. Temporary  orders completed.  MDM  Patient with h/o CAD, pacemaker, HTN, chronic renal insuffuciency , strokes x 3 here with generalized weakness that began last night. Labs with increase in BUN, Cr indicating a mild dehydration. BNP is more elevated than previous visits. Chest xray shows bibaislar opacities but no frank CHF.Patient failed ambulation. He was unable to stand at the beside. Spoke with Dr. Lendell Caprice who will admit the patient.Patientand daughter  informed of clinical course, understand medical decision-making process, and agree with plan.Pt stable in ED with no significant deterioration in condition.The patient appears reasonably stabilized for admission considering the current resources, flow, and capabilities available in the ED at this time, and I doubt any other [facility] requiring further screening and/or treatment in the ED prior to admission.  MDM Reviewed: nursing note and vitals Reviewed previous: labs, x-ray and ECG Interpretation: labs, ECG and x-ray Total time providing critical care: 40. Consults: hospitalist.           Nicoletta Dress. Colon Branch, MD 11/10/11 1131  Nicoletta Dress. Colon Branch, MD 11/10/11 1134  Nicoletta Dress. Colon Branch, MD 11/11/11 8657

## 2011-11-10 NOTE — ED Notes (Signed)
Attempted to call report. Nurse not available at present to take report

## 2011-11-10 NOTE — ED Notes (Signed)
Ep in with pt

## 2011-11-11 ENCOUNTER — Inpatient Hospital Stay (HOSPITAL_COMMUNITY): Payer: Medicare Other

## 2011-11-11 DIAGNOSIS — J019 Acute sinusitis, unspecified: Secondary | ICD-10-CM | POA: Diagnosis present

## 2011-11-11 DIAGNOSIS — N179 Acute kidney failure, unspecified: Secondary | ICD-10-CM | POA: Diagnosis present

## 2011-11-11 DIAGNOSIS — R531 Weakness: Secondary | ICD-10-CM | POA: Diagnosis present

## 2011-11-11 DIAGNOSIS — I1310 Hypertensive heart and chronic kidney disease without heart failure, with stage 1 through stage 4 chronic kidney disease, or unspecified chronic kidney disease: Secondary | ICD-10-CM | POA: Diagnosis present

## 2011-11-11 LAB — BASIC METABOLIC PANEL
CO2: 28 mEq/L (ref 19–32)
Calcium: 9.4 mg/dL (ref 8.4–10.5)
Creatinine, Ser: 2.05 mg/dL — ABNORMAL HIGH (ref 0.50–1.35)
GFR calc Af Amer: 31 mL/min — ABNORMAL LOW (ref >90)

## 2011-11-11 LAB — PRO B NATRIURETIC PEPTIDE: Pro B Natriuretic peptide (BNP): 2241 pg/mL — ABNORMAL HIGH (ref 0–450)

## 2011-11-11 MED ORDER — ALBUTEROL SULFATE (5 MG/ML) 0.5% IN NEBU
INHALATION_SOLUTION | RESPIRATORY_TRACT | Status: AC
  Filled 2011-11-11: qty 0.5

## 2011-11-11 MED ORDER — SODIUM CHLORIDE 0.9 % IJ SOLN
3.0000 mL | Freq: Two times a day (BID) | INTRAMUSCULAR | Status: DC
  Administered 2011-11-11 – 2011-11-12 (×2): 3 mL via INTRAVENOUS
  Filled 2011-11-11 (×2): qty 3

## 2011-11-11 MED ORDER — SODIUM CHLORIDE 0.9 % IJ SOLN
INTRAMUSCULAR | Status: AC
  Filled 2011-11-11: qty 3

## 2011-11-11 MED ORDER — SODIUM CHLORIDE 0.9 % IV SOLN: 250.0000 mL | INTRAVENOUS | Status: DC | PRN

## 2011-11-11 MED ORDER — ALBUTEROL SULFATE (5 MG/ML) 0.5% IN NEBU
2.5000 mg | INHALATION_SOLUTION | Freq: Four times a day (QID) | RESPIRATORY_TRACT | Status: DC
  Administered 2011-11-11 – 2011-11-13 (×6): 2.5 mg via RESPIRATORY_TRACT
  Filled 2011-11-11 (×6): qty 0.5

## 2011-11-11 MED ORDER — SODIUM CHLORIDE 0.9 % IJ SOLN: 3.0000 mL | INTRAMUSCULAR | Status: DC | PRN

## 2011-11-11 NOTE — Progress Notes (Signed)
UR Chart Review Completed  

## 2011-11-11 NOTE — H&P (Signed)
Hospital Admission Note Date: 11/11/2011  Patient name: [name] Medical record number: [id number] Date of birth: 14-Dec-1918 Age: 78 y.o. Gender: male PCP: Colette Ribas, MD, MD  Attending physician: Christiane Ha  Chief Complaint: Weakness  History of Present Illness:  [name] is an 78 y.o. male who presents to the emergency room with worsening weakness over the past few days. He has a history of diastolic dysfunction. His legs had been more edematous several weeks ago. He usually takes torsemide a few times a week. He called Dr. Daphene Jaeger who recommended he change his torsemide to every day. This was a few weeks ago. Since then he's become progressively weaker and he can barely walk now. He was unable to stand on his own. He reports having had what sounds like lower tremor the Dopplers last month in Dr. Landry Dyke office. The patient has chronic kidney disease with a creatinine usually about 1.3 or so. Today, his creatinine was 2.3. He's also complaining of postnasal drip and sinus pressure. He's had no fevers. His appetite has been fine. He's had no diarrhea, dysuria or other medication changes.  Past Medical History  Diagnosis Date  . Coronary artery disease   . Hypertension   . Pacemaker   . CKD (chronic kidney disease), stage III   . Complete heart block   . GERD (gastroesophageal reflux disease)   . Diastolic heart failure   . Myocardial infarction 2010  . Stroke 1998, 2005, 2009  . Shingles     Meds: Prescriptions prior to admission  Medication Sig Dispense Refill  . aspirin EC 81 MG tablet Take 81 mg by mouth daily.      . Cholecalciferol (VITAMIN D) 2000 UNITS tablet Take 2,000 Units by mouth daily.      Marland Kitchen esomeprazole (NEXIUM) 40 MG capsule Take 40 mg by mouth daily before breakfast.        . ferrous sulfate 325 (65 FE) MG tablet Take 325 mg by mouth daily.      Marland Kitchen ibuprofen (ADVIL,MOTRIN) 200 MG tablet Take 400 mg by mouth every 6 (six) hours as  needed. For pain      . metoprolol (LOPRESSOR) 50 MG tablet Take 50 mg by mouth 2 (two) times daily.      Marland Kitchen OVER THE COUNTER MEDICATION Take 2 capsules by mouth daily. Immutol (gets medication from [facility] in [place])      . potassium chloride (KLOR-CON) 10 MEQ CR tablet Take 10 mEq by mouth daily.        . Saw Palmetto 450 MG CAPS Take 1 capsule by mouth daily.      Marland Kitchen torsemide (DEMADEX) 20 MG tablet Take 20 mg by mouth daily.        . vitamin B-12 (CYANOCOBALAMIN) 500 MCG tablet Take 500 mcg by mouth daily.        Allergies: Review of patient's allergies indicates no known allergies. History   Social History  . Marital Status: Married    Spouse Name: N/A    Number of Children: N/A  . Years of Education: N/A   Occupational History  . Not on file.   Social History Main Topics  . Smoking status: Never Smoker   . Smokeless tobacco: Not on file  . Alcohol Use: Yes     once a week  . Drug Use: No  . Sexually Active:    Other Topics Concern  . Not on file   Social History Narrative  .  No narrative on file   History reviewed. No pertinent family history. Past Surgical History  Procedure Date  . Insert / replace / remove pacemaker   . Eye surgery   . Hernia repair     Review of Systems: Systems reviewed and as per HPI, otherwise negative.  Physical Exam: Blood pressure 192/86, pulse 59, temperature 97.4 F (36.3 C), temperature source Oral, resp. rate 20, height 5\' 10"  (1.778 m), weight 88.8 kg (195 lb 12.3 oz), SpO2 97.00%. BP 192/86  Pulse 59  Temp(Src) 97.4 F (36.3 C) (Oral)  Resp 20  Ht 5\' 10"  (1.778 m)  Wt 88.8 kg (195 lb 12.3 oz)  BMI 28.09 kg/m2  SpO2 97%  General Appearance:    Alert, cooperative, no distress, appears stated age  Head:    Normocephalic, without obvious abnormality, atraumatic  Eyes:    PERRL, conjunctiva/corneas clear, EOM's intact, fundi    benign, both eyes       Ears:    Normal TM's and external ear canals, both ears  Nose:    Nares normal, septum midline, mucosa normal, no drainage    mild right maxillary sinus tenderness  Throat:   Lips, mucosa, and tongue dry   Neck:   Supple, symmetrical, trachea midline, no adenopathy;       thyroid:  No enlargement/tenderness/nodules; no carotid   bruit or JVD  Back:     Symmetric, no curvature, ROM normal, no CVA tenderness  Lungs:     Clear to auscultation bilaterally, respirations unlabored  Chest wall:    No tenderness or deformity  Heart:    Regular rate and rhythm, S1 and S2 normal, no murmur, rub   or gallop  Abdomen:     Soft, non-tender, bowel sounds active all four quadrants,    no masses, no organomegaly  Genitalia:   deferred   Rectal:   deferred   Extremities:   Extremities normal, atraumatic, no cyanosis 2+ ankle edema  Pulses:   2+ and symmetric all extremities  Skin:   Skin color, texture, turgor normal, no rashes or lesions  Lymph nodes:   Cervical, supraclavicular, and axillary nodes normal  Neurologic:   CNII-XII intact. Normal strength, sensation and reflexes      throughout    Psychiatric: Normal affect cooperative  Lab results: Basic Metabolic Panel:  Basename 11/11/11 0509 11/10/11 0817  NA 139 138  K 4.0 3.9  CL 105 104  CO2 28 27  GLUCOSE 93 102*  BUN 28* 31*  CREATININE 2.05* 2.34*  CALCIUM 9.4 9.7  MG -- --  PHOS -- --   Liver Function Tests: No results found for this basename: AST:2,ALT:2,ALKPHOS:2,BILITOT:2,PROT:2,ALBUMIN:2 in the last 72 hours No results found for this basename: LIPASE:2,AMYLASE:2 in the last 72 hours No results found for this basename: AMMONIA:2 in the last 72 hours CBC:  Basename 11/10/11 0817  WBC 6.0  NEUTROABS 3.5  HGB 12.5*  HCT 37.4*  MCV 92.8  PLT 175   Cardiac Enzymes:  Basename 11/10/11 0817  CKTOTAL 63  CKMB 3.5  CKMBINDEX --  TROPONINI <0.30   BNP:  Basename 11/10/11 0817  PROBNP 2808.0*   D-Dimer: No results found for this basename: DDIMER:2 in the last 72 hours CBG: No  results found for this basename: GLUCAP:6 in the last 72 hours Hemoglobin A1C: No results found for this basename: HGBA1C in the last 72 hours Fasting Lipid Panel: No results found for this basename: CHOL,HDL,LDLCALC,TRIG,CHOLHDL,LDLDIRECT in the last 72 hours Thyroid Function  Tests: No results found for this basename: TSH,T4TOTAL,FREET4,T3FREE,THYROIDAB in the last 72 hours Anemia Panel: No results found for this basename: VITAMINB12,FOLATE,FERRITIN,TIBC,IRON,RETICCTPCT in the last 72 hours Coagulation: No results found for this basename: LABPROT:2,INR:2 in the last 72 hours Urine Drug Screen: Drugs of Abuse  No results found for this basename: labopia, cocainscrnur, labbenz, amphetmu, thcu, labbarb    Alcohol Level: No results found for this basename: ETH:2 in the last 72 hours Urinalysis: Results for ZAIDIN, BLYDEN (MRN [id number]) as of 11/11/2011 07:55  Ref. Range 11/10/2011 09:58  Color, Urine Latest Range: YELLOW  YELLOW  APPearance Latest Range: CLEAR  CLEAR  Specific Gravity, Urine Latest Range: 1.005-1.030  1.025  pH Latest Range: 5.0-8.0  6.0  Glucose, UA Latest Range: NEGATIVE mg/dL NEGATIVE  Bilirubin Urine Latest Range: NEGATIVE  NEGATIVE  Ketones, ur Latest Range: NEGATIVE mg/dL NEGATIVE  Protein Latest Range: NEGATIVE mg/dL 562 (A)  Urobilinogen, UA Latest Range: 0.0-1.0 mg/dL 0.2  Nitrite Latest Range: NEGATIVE  NEGATIVE  Leukocytes, UA Latest Range: NEGATIVE  NEGATIVE  RBC / HPF Latest Range: <3 RBC/hpf 0-2  Hgb urine dipstick Latest Range: NEGATIVE  TRACE (A)    Imaging results:  Dg Chest Portable 1 View  11/10/2011  *RADIOLOGY REPORT*  Clinical Data: Weakness, shortness of breath.  PORTABLE CHEST - 1 VIEW  Comparison: 08/03/2011  Findings: Left pacer remains in place, unchanged.  Cardiomegaly. Bibasilar airspace opacities, likely atelectasis.  No effusions or acute bony abnormality.  IMPRESSION: Cardiomegaly.  Bibasilar opacities, likely atelectasis.  Original  Report Authenticated By: Cyndie Chime, M.D.    Assessment & Plan: Principal Problem:  *Dehydration Active Problems:  ARF (acute renal failure)  Weakness generalized  Acute sinusitis  Malignant HTN with heart disease, w/o CHF, with chronic kidney disease  Diastolic heart failure  CKD (chronic kidney disease), stage III  CAD (coronary artery disease)  Patient's symptoms appear to be mainly due to intravascular volume depletion do to increased diuresis. He does have some edema but this is rather mild. It seems to bother him tremendously so he agrees with TED hose for now. Hold torsemide 20 gentle IV hydration. He is no evidence of CHF at this time. I've spoken with Dr. Tresa Endo who will check his office records and get back to me with regard to any medication changes or testing done recently. Monitor creatinine and daily weights. Patient already feels slightly improved after a small amount of IV fluid in the emergency room. I will get a PT consult. He reports having been referred to outpatient PT and will most likely need home PT at discharge.  With regard to blood pressure control, patient had malignant hypertension during the last hospitalization and was resistant to any increase in his antihypertensives. This may need to be discussed again during this hospitalization and his blood pressure remains elevated.  Acute sinusitis could be contributing to his overall symptoms and this will be treated with amoxicillin and Flonase. Jessy Calixte L 11/11/2011, 7:54 AM

## 2011-11-11 NOTE — Progress Notes (Signed)
Subjective: Started wheezing today. Had this last week. According to nursing staff, requires 2 people to get him up and out of bed.  Objective: Vital signs in last 24 hours: Filed Vitals:   11/11/11 0904 11/11/11 0908 11/11/11 1028 11/11/11 1327  BP: 177/109 181/94 172/100 172/84  Pulse: 67  60 62  Temp:   97.1 F (36.2 C) 97.8 F (36.6 C)  TempSrc:   Oral   Resp:   22 21  Height:      Weight:      SpO2: 94%  96% 98%   Weight change:   Intake/Output Summary (Last 24 hours) at 11/11/11 1444 Last data filed at 11/11/11 0649  Gross per 24 hour  Intake 665.83 ml  Output    700 ml  Net -34.17 ml   Physical Exam: General: Breathing more labored today. Audible wheezes. Talking in short sentences. Lungs: Prolonged expiratory phase. Diminished throughout. Expiratory wheeze Cardiovascular regular rate rhythm without murmurs gallops rubs  abdomen normal bowel sounds soft nontender nondistended Extremities no clubbing cyanosis. TED hose in place.  Lab Results: Basic Metabolic Panel:  Lab 11/11/11 4098 11/10/11 0817  NA 139 138  K 4.0 3.9  CL 105 104  CO2 28 27  GLUCOSE 93 102*  BUN 28* 31*  CREATININE 2.05* 2.34*  CALCIUM 9.4 9.7  MG -- --  PHOS -- --   Liver Function Tests: No results found for this basename: AST:2,ALT:2,ALKPHOS:2,BILITOT:2,PROT:2,ALBUMIN:2 in the last 168 hours No results found for this basename: LIPASE:2,AMYLASE:2 in the last 168 hours No results found for this basename: AMMONIA:2 in the last 168 hours CBC:  Lab 11/10/11 0817  WBC 6.0  NEUTROABS 3.5  HGB 12.5*  HCT 37.4*  MCV 92.8  PLT 175   Cardiac Enzymes:  Lab 11/10/11 0817  CKTOTAL 63  CKMB 3.5  CKMBINDEX --  TROPONINI <0.30   BNP:  Lab 11/10/11 0817  PROBNP 2808.0*   Studies/Results: Dg Chest Portable 1 View  11/10/2011  *RADIOLOGY REPORT*  Clinical Data: Weakness, shortness of breath.  PORTABLE CHEST - 1 VIEW  Comparison: 08/03/2011  Findings: Left pacer remains in place,  unchanged.  Cardiomegaly. Bibasilar airspace opacities, likely atelectasis.  No effusions or acute bony abnormality.  IMPRESSION: Cardiomegaly.  Bibasilar opacities, likely atelectasis.  Original Report Authenticated By: Cyndie Chime, M.D.   Scheduled Meds:   . albuterol  2.5 mg Nebulization Q6H  . amLODipine  5 mg Oral Daily  . amoxicillin  500 mg Oral Q8H  . aspirin  325 mg Oral Daily  . enoxaparin  40 mg Subcutaneous Daily  . ferrous sulfate  325 mg Oral Daily  . fluticasone  2 spray Each Nare Daily  . metoprolol  50 mg Oral BID  . pantoprazole  40 mg Oral Daily  . potassium chloride  10 mEq Oral Once  . vitamin B-12  500 mcg Oral Daily   Continuous Infusions:   . sodium chloride 50 mL/hr at 11/11/11 0114   PRN Meds:.acetaminophen, alum & mag hydroxide-simeth, senna-docusate, traZODone, DISCONTD: acetaminophen Assessment/Plan: Principal Problem:  *Dehydration Active Problems:  ARF (acute renal failure)  Weakness generalized  Acute sinusitis  Malignant HTN with heart disease, w/o CHF, with chronic kidney disease  Diastolic heart failure  CKD (chronic kidney disease), stage III  CAD (coronary artery disease)  wheezing/labored breathing  Patient is having more respiratory distress today. His chest x-ray yesterday showed nothing concerning. His pro BNP was slightly elevated. I will check a stat portable chest x-ray,  repeat a pro BNP, give bronchodilators and stop IV fluids. I discussed the case with Dr. Nicholaus Bloom. Dr. Landry Mellow saw the patient last month. No Dopplers were done at that time. He did have some edema and torsemide was changed to every day. Patient needs to be medically stabilized, but is agreeable to short-term skilled nursing facility.    LOS: 1 day   Braylie Badami L 11/11/2011, 2:44 PM

## 2011-11-11 NOTE — Progress Notes (Addendum)
Physical Therapy Evaluation Patient Details Name: [name] MRN: [id number] DOB: [birth year] Today's Date: 11/11/2011 Time: 8:23-9:16 Charges: 1 EV, 1 TA Problem List:  Patient Active Problem List  Diagnoses  . CVA (cerebral infarction)  . Lethargy  . HTN (hypertension)  . Dehydration  . Diastolic heart failure  . CKD (chronic kidney disease), stage III  . CAD (coronary artery disease)  . Acute sinusitis  . ARF (acute renal failure)  . Malignant HTN with heart disease, w/o CHF, with chronic kidney disease  . Weakness generalized    Past Medical History:  Past Medical History  Diagnosis Date  . Coronary artery disease   . Hypertension   . Pacemaker   . CKD (chronic kidney disease), stage III   . Complete heart block   . GERD (gastroesophageal reflux disease)   . Diastolic heart failure   . Myocardial infarction 2010  . Stroke 1998, 2005, 2009  . Shingles    Past Surgical History:  Past Surgical History  Procedure Date  . Insert / replace / remove pacemaker   . Eye surgery   . Hernia repair     PT Assessment/Plan/Recommendation PT Assessment Clinical Impression Statement: 78 y.o. male admitted to [facility] with weakness, dehydration and conjestion.  He presents with decreased strength, decreased O2 sats, increased DOE, decreased balance and activity tolerance.  He would benefit from skilled PT to maximize his independence, functional mobility and safety so that after extensive rehab he can return home safely with his wife's assistance.   PT Recommendation/Assessment: Patient will need skilled PT in the acute care venue PT Problem List: Decreased strength;Decreased activity tolerance;Decreased balance;Decreased mobility PT Therapy Diagnosis : Difficulty walking;Abnormality of gait;Generalized weakness PT Plan PT Frequency: Min 3X/week PT Treatment/Interventions: Stair training;DME instruction;Gait training;Functional mobility training;Therapeutic activities;Therapeutic  exercise;Balance training;Neuromuscular re-education;Cognitive remediation;Patient/family education PT Recommendation Follow Up Recommendations: Skilled nursing facility Equipment Recommended: Defer to next venue PT Goals  Acute Rehab PT Goals PT Goal Formulation: With patient Time For Goal Achievement: 2 weeks Pt will go Supine/Side to Sit: with modified independence;with HOB 0 degrees PT Goal: Supine/Side to Sit - Progress: Not met Pt will go Sit to Supine/Side: with modified independence;with HOB 0 degrees PT Goal: Sit to Supine/Side - Progress: Not met Pt will go Sit to Stand: with modified independence PT Goal: Sit to Stand - Progress: Not met Pt will go Stand to Sit: with modified independence PT Goal: Stand to Sit - Progress: Not met Pt will Transfer Bed to Chair/Chair to Bed: with modified independence PT Transfer Goal: Bed to Chair/Chair to Bed - Progress: Not met Pt will Ambulate: 51 - 150 feet;with supervision;with rolling walker PT Goal: Ambulate - Progress: Not met  PT Evaluation Prior Functioning  Home Living Lives With: Spouse (spouse can only provide supervision) Type of Home: House Home Layout: One level Home Access: Stairs to enter Entrance Stairs-Rails: Right;Left Entrance Stairs-Number of Steps: 3 Home Adaptive Equipment: Environmental consultant - four wheeled Prior Function Able to Take Stairs?: Yes Driving: No Cognition Cognition Orientation Level: Oriented X4 Extremity Assessment LUE Assessment LUE Assessment:  (L hemiperetic arm.) RLE Assessment RLE Assessment: Exceptions to [facility] RLE Strength RLE Overall Strength: Deficits RLE Overall Strength Comments: 3/5 LLE Assessment LLE Assessment: Exceptions to [facility] LLE Strength LLE Overall Strength: Deficits LLE Overall Strength Comments: 3/5 Mobility (including Balance) Bed Mobility Bed Mobility: Yes Supine to Sit: 3: Mod assist;HOB elevated (Comment degrees) Supine to Sit Details (indicate cue type and reason): mod  assit to support trunk  to get to EOB.  Patient pulling on PT for support with right arm.   Sitting - Scoot to Edge of Bed: 3: Mod assist Sitting - Scoot to Edge of Bed Details (indicate cue type and reason): mod assist to pull with right arm to EOB.  Transfers Transfers: Yes Sit to Stand: 4: Min assist;From elevated surface;From bed Sit to Stand Details (indicate cue type and reason): min assist to boost trunk up over weak legs.   Stand to Sit: 4: Min assist;With upper extremity assist;With armrests;To elevated surface;To chair/3-in-1 Stand to Sit Details: min assist to help control descent to sit.   Stand Pivot Transfers: 4: Min assist Stand Pivot Transfer Details (indicate cue type and reason): min assist with RW bed to chair, patient with difficulty moving feet.   Ambulation/Gait Ambulation/Gait: No (patient was unable too weak.  )  Balance Balance Assessed: Yes Static Sitting Balance Static Sitting - Balance Support: Right upper extremity supported;Left upper extremity supported;Feet supported Static Sitting - Level of Assistance: 4: Min assist Static Sitting - Comment/# of Minutes: 10 mins, LOB to the left requiring min assist to get back to upright sitting.   End of Session PT - End of Session Equipment Utilized During Treatment: Gait belt (RW) Activity Tolerance: Patient limited by fatigue Patient left: in chair;with call bell in reach Nurse Communication:  (spoke with case manager re: discharge plan) General Behavior During Session: [facility] for tasks performed Cognition: [facility] for tasks performed  Puanani Gene B. Edward Guthmiller, PT, DPT [phone] 11/11/2011, 9:35 AM

## 2011-11-12 LAB — BASIC METABOLIC PANEL
BUN: 24 mg/dL — ABNORMAL HIGH (ref 6–23)
CO2: 28 mEq/L (ref 19–32)
Calcium: 9.5 mg/dL (ref 8.4–10.5)
Creatinine, Ser: 1.79 mg/dL — ABNORMAL HIGH (ref 0.50–1.35)

## 2011-11-12 MED ORDER — FLEET ENEMA 7-19 GM/118ML RE ENEM: 1.0000 | ENEMA | Freq: Every day | RECTAL | Status: DC | PRN

## 2011-11-12 MED ORDER — MUSCLE RUB 10-15 % EX CREA
TOPICAL_CREAM | Freq: Every day | CUTANEOUS | Status: DC
  Administered 2011-11-12 – 2011-11-13 (×2): via TOPICAL
  Filled 2011-11-12: qty 85

## 2011-11-12 MED ORDER — BISACODYL 10 MG RE SUPP
10.0000 mg | Freq: Once | RECTAL | Status: AC
  Administered 2011-11-12: 10 mg via RECTAL
  Filled 2011-11-12: qty 1

## 2011-11-12 MED ORDER — TROLAMINE SALICYLATE 10 % EX CREA
TOPICAL_CREAM | Freq: Every day | CUTANEOUS | Status: DC
  Filled 2011-11-12: qty 85

## 2011-11-12 MED ORDER — AMLODIPINE BESYLATE 5 MG PO TABS
10.0000 mg | ORAL_TABLET | Freq: Every day | ORAL | Status: DC
  Administered 2011-11-13: 10 mg via ORAL
  Filled 2011-11-12: qty 2

## 2011-11-12 MED ORDER — DOCUSATE SODIUM 100 MG PO CAPS
100.0000 mg | ORAL_CAPSULE | Freq: Two times a day (BID) | ORAL | Status: DC
  Administered 2011-11-12 – 2011-11-13 (×3): 100 mg via ORAL
  Filled 2011-11-12 (×3): qty 1

## 2011-11-12 NOTE — Progress Notes (Signed)
CSW presented bed offers to pt and pt's wife and they choose [facility].  Facility notified.  Awaiting stability for d/c.  [name]

## 2011-11-12 NOTE — Progress Notes (Addendum)
Subjective: Wheezing has improved. Constipated. Requesting a suppository. Requesting "arnicare" cream which is a plant-based product.   Objective: Vital signs in last 24 hours: Filed Vitals:   11/12/11 0444 11/12/11 0535 11/12/11 0653 11/12/11 1013  BP: 229/147 174/96  171/90  Pulse: 65   60  Temp:    97.4 F (36.3 C)  TempSrc:      Resp:    19  Height:      Weight:      SpO2:   94% 96%   Weight change:   Intake/Output Summary (Last 24 hours) at 11/12/11 1312 Last data filed at 11/12/11 0700  Gross per 24 hour  Intake    840 ml  Output   1300 ml  Net   -460 ml   Physical Exam: General: No respiratory distress. Appears much more comfortable. Speaking in full sentences Lungs: Clear to auscultation bilaterally without wheeze rhonchi or rales Cardiovascular regular rate rhythm without murmurs gallops rubs  abdomen normal bowel sounds soft nontender nondistended Extremities no clubbing cyanosis. TED hose in place.  Lab Results: Basic Metabolic Panel:  Lab 11/12/11 1610 11/11/11 0509  NA 140 139  K 4.0 4.0  CL 106 105  CO2 28 28  GLUCOSE 100* 93  BUN 24* 28*  CREATININE 1.79* 2.05*  CALCIUM 9.5 9.4  MG -- --  PHOS -- --   Liver Function Tests: No results found for this basename: AST:2,ALT:2,ALKPHOS:2,BILITOT:2,PROT:2,ALBUMIN:2 in the last 168 hours No results found for this basename: LIPASE:2,AMYLASE:2 in the last 168 hours No results found for this basename: AMMONIA:2 in the last 168 hours CBC:  Lab 11/10/11 0817  WBC 6.0  NEUTROABS 3.5  HGB 12.5*  HCT 37.4*  MCV 92.8  PLT 175   Cardiac Enzymes:  Lab 11/10/11 0817  CKTOTAL 63  CKMB 3.5  CKMBINDEX --  TROPONINI <0.30   BNP:  Lab 11/11/11 0509 11/10/11 0817  PROBNP 2241.0* 2808.0*   Studies/Results: Dg Chest Port 1 View  11/11/2011  *RADIOLOGY REPORT*  Clinical Data: Respiratory distress.  PORTABLE CHEST - 1 VIEW  Comparison: 11/10/2011  Findings: Left pacer remains in place, unchanged.  Mild  cardiomegaly.  Linear densities in the lung bases, similar to prior study, likely atelectasis.  No effusions.  No acute bony abnormality. Mild tortuosity and ectasia of the thoracic aorta.  IMPRESSION: Cardiomegaly.  Bibasilar atelectasis.  No change.  Original Report Authenticated By: Cyndie Chime, M.D.   Scheduled Meds:    . albuterol  2.5 mg Nebulization Q6H  . albuterol      . amLODipine  10 mg Oral Daily  . amoxicillin  500 mg Oral Q8H  . aspirin  325 mg Oral Daily  . enoxaparin  40 mg Subcutaneous Daily  . ferrous sulfate  325 mg Oral Daily  . fluticasone  2 spray Each Nare Daily  . metoprolol  50 mg Oral BID  . pantoprazole  40 mg Oral Daily  . sodium chloride      . trolamine salicylate   Topical Daily  . vitamin B-12  500 mcg Oral Daily  . DISCONTD: amLODipine  5 mg Oral Daily  . DISCONTD: sodium chloride  3 mL Intravenous Q12H   Continuous Infusions:  PRN Meds:.acetaminophen, alum & mag hydroxide-simeth, senna-docusate, traZODone, DISCONTD: sodium chloride, DISCONTD: acetaminophen, DISCONTD: sodium chloride Assessment/Plan: Principal Problem:  *Dehydration Active Problems:  ARF (acute renal failure)  Weakness generalized  Acute sinusitis  Malignant HTN with heart disease, w/o CHF, with chronic kidney disease  Diastolic heart failure  CKD (chronic kidney disease), stage III  CAD (coronary artery disease)  wheezing/labored breathing, resolved. Chest x-ray and proBNP unchanged. Constipation  Dulcolax suppository per patient request. Add Colace. Awaiting placement. Monitor creatinine. Blood pressure still high. We will increase Norvasc.   LOS: 2 days   Nitza Schmid L 11/12/2011, 1:12 PM

## 2011-11-13 ENCOUNTER — Inpatient Hospital Stay
Admission: RE | Admit: 2011-11-13 | Discharge: 2011-12-26 | Disposition: A | Discharge status: Home Health | Source: Ambulatory Visit | Attending: Internal Medicine | Admitting: Internal Medicine

## 2011-11-13 LAB — BASIC METABOLIC PANEL
Calcium: 9.6 mg/dL (ref 8.4–10.5)
Creatinine, Ser: 1.76 mg/dL — ABNORMAL HIGH (ref 0.50–1.35)
GFR calc non Af Amer: 32 mL/min — ABNORMAL LOW (ref >90)
Glucose, Bld: 98 mg/dL (ref 70–99)
Sodium: 140 mEq/L (ref 135–145)

## 2011-11-13 MED ORDER — FLUTICASONE PROPIONATE 50 MCG/ACT NA SUSP: 2.0000 | Freq: Every day | NASAL | Status: DC

## 2011-11-13 MED ORDER — AMLODIPINE BESYLATE 10 MG PO TABS: 10.0000 mg | ORAL_TABLET | Freq: Every day | ORAL | Status: DC

## 2011-11-13 MED ORDER — CLONIDINE HCL 0.1 MG PO TABS: 0.1000 mg | ORAL_TABLET | Freq: Three times a day (TID) | ORAL | Status: DC | PRN

## 2011-11-13 MED ORDER — ALBUTEROL SULFATE (5 MG/ML) 0.5% IN NEBU: 2.5000 mg | INHALATION_SOLUTION | Freq: Three times a day (TID) | RESPIRATORY_TRACT | Status: DC

## 2011-11-13 MED ORDER — AMOXICILLIN 250 MG PO CAPS
500.0000 mg | ORAL_CAPSULE | Freq: Three times a day (TID) | ORAL | Status: DC
  Administered 2011-11-13: 500 mg via ORAL
  Filled 2011-11-13: qty 2

## 2011-11-13 MED ORDER — TORSEMIDE 20 MG PO TABS: 20.0000 mg | ORAL_TABLET | Freq: Every day | ORAL | Status: DC | PRN

## 2011-11-13 MED ORDER — AMOXICILLIN 500 MG PO CAPS: 500.0000 mg | ORAL_CAPSULE | Freq: Two times a day (BID) | ORAL | Status: AC

## 2011-11-13 MED ORDER — SENNA-DOCUSATE SODIUM 8.6-50 MG PO TABS: 1.0000 | ORAL_TABLET | Freq: Every day | ORAL | Status: AC

## 2011-11-13 NOTE — Progress Notes (Signed)
Pt d/c today by MD to [facility].  Pt, pt's wife, and facility aware and agreeable.  Pt to transfer with RN.  D/C summary faxed.  Karn Cassis

## 2011-11-13 NOTE — Discharge Summary (Signed)
Physician Discharge Summary  [name] MRN: [id number] DOB/AGE: 05-02-1919 78 y.o..  PCP: Colette Ribas, MD, MD   Admit date: 11/10/2011 Discharge date: 11/13/2011  Discharge Diagnoses:  1. Generalized weakness/deconditioning, likely secondary to acute renal failure superimposed on chronic kidney disease and secondarily secondary to acute sinusitis. 2. Acute renal failure with a baseline creatinine of 1.3-1.5. His creatinine was 1.76 at the time of discharge. 3. Stage III chronic kidney disease. 4. Acute sinusitis. 5. Mild bronchospasms without evidence of decompensated heart failure. 6. Chronic diastolic congestive heart failure, compensated. 7. Malignant hypertension. 8. History of coronary artery disease. 9. Mild normocytic anemia. Further workup was deferred.     Current Discharge Medication List    START taking these medications   Details  albuterol (PROVENTIL) (5 MG/ML) 0.5% nebulizer solution Take 0.5 mLs (2.5 mg total) by nebulization 3 (three) times daily. Qty: 20 mL, Refills: 0    amoxicillin (AMOXIL) 500 MG capsule Take 1 capsule (500 mg total) by mouth 2 (two) times daily. Qty: 14 capsule, Refills: 0    cloNIDine (CATAPRES) 0.1 MG tablet Take 1 tablet (0.1 mg total) by mouth 3 (three) times daily as needed (For SBP equal to or greater than 180). Qty: 60 tablet, Refills: 0    fluticasone (FLONASE) 50 MCG/ACT nasal spray Place 2 sprays into the nose daily. Qty: 1 g, Refills: 0    sennosides-docusate sodium (SENOKOT-S) 8.6-50 MG tablet Take 1 tablet by mouth at bedtime.      CONTINUE these medications which have CHANGED   Details  amLODipine (NORVASC) 10 MG tablet Take 1 tablet (10 mg total) by mouth daily.    torsemide (DEMADEX) 20 MG tablet Take 1 tablet (20 mg total) by mouth daily as needed (For leg swelling.). Qty: 30 tablet, Refills: 0      CONTINUE these medications which have NOT CHANGED   Details  aspirin EC 81 MG tablet Take 81 mg  by mouth daily.    Cholecalciferol (VITAMIN D) 2000 UNITS tablet Take 2,000 Units by mouth daily.    esomeprazole (NEXIUM) 40 MG capsule Take 40 mg by mouth daily before breakfast.      ferrous sulfate 325 (65 FE) MG tablet Take 325 mg by mouth daily.    ibuprofen (ADVIL,MOTRIN) 200 MG tablet Take 400 mg by mouth every 6 (six) hours as needed. For pain    metoprolol (LOPRESSOR) 50 MG tablet Take 50 mg by mouth 2 (two) times daily.    OVER THE COUNTER MEDICATION Take 2 capsules by mouth daily. Immutol (gets medication from [facility] mail[facility] in [place])    potassium chloride (KLOR-CON) 10 MEQ CR tablet Take 10 mEq by mouth daily.      Saw Palmetto 450 MG CAPS Take 1 capsule by mouth daily.    vitamin B-12 (CYANOCOBALAMIN) 500 MCG tablet Take 500 mcg by mouth daily.      STOP taking these medications     aspirin 325 MG tablet      doxazosin (CARDURA) 1 MG tablet      hydrALAZINE (APRESOLINE) 50 MG tablet         Discharge Condition: Improved and stable.  Disposition: Skilled nursing facility ([facility]).   Consults: None.   Significant Diagnostic Studies: Dg Chest Port 1 View  11/11/2011  *RADIOLOGY REPORT*  Clinical Data: Respiratory distress.  PORTABLE CHEST - 1 VIEW  Comparison: 11/10/2011  Findings: Left pacer remains in place, unchanged.  Mild cardiomegaly.  Linear densities in the  lung bases, similar to prior study, likely atelectasis.  No effusions.  No acute bony abnormality. Mild tortuosity and ectasia of the thoracic aorta.  IMPRESSION: Cardiomegaly.  Bibasilar atelectasis.  No change.  Original Report Authenticated By: Cyndie Chime, M.D.   Dg Chest Portable 1 View  11/10/2011  *RADIOLOGY REPORT*  Clinical Data: Weakness, shortness of breath.  PORTABLE CHEST - 1 VIEW  Comparison: 08/03/2011  Findings: Left pacer remains in place, unchanged.  Cardiomegaly. Bibasilar airspace opacities, likely atelectasis.  No effusions or acute bony abnormality.  IMPRESSION:  Cardiomegaly.  Bibasilar opacities, likely atelectasis.  Original Report Authenticated By: Cyndie Chime, M.D.     Microbiology: No results found for this or any previous visit (from the past 240 hour(s)).   Labs: Results for orders placed during the hospital encounter of 11/10/11 (from the past 48 hour(s))  BASIC METABOLIC PANEL     Status: Abnormal   Collection Time   11/12/11  5:06 AM      Component Value Range Comment   Sodium 140  135 - 145 (mEq/L)    Potassium 4.0  3.5 - 5.1 (mEq/L)    Chloride 106  96 - 112 (mEq/L)    CO2 28  19 - 32 (mEq/L)    Glucose, Bld 100 (*) 70 - 99 (mg/dL)    BUN 24 (*) 6 - 23 (mg/dL)    Creatinine, Ser 4.09 (*) 0.50 - 1.35 (mg/dL)    Calcium 9.5  8.4 - 10.5 (mg/dL)    GFR calc non Af Amer 32 (*) >90 (mL/min)    GFR calc Af Amer 37 (*) >90 (mL/min)   BASIC METABOLIC PANEL     Status: Abnormal   Collection Time   11/13/11  5:33 AM      Component Value Range Comment   Sodium 140  135 - 145 (mEq/L)    Potassium 4.4  3.5 - 5.1 (mEq/L)    Chloride 105  96 - 112 (mEq/L)    CO2 28  19 - 32 (mEq/L)    Glucose, Bld 98  70 - 99 (mg/dL)    BUN 25 (*) 6 - 23 (mg/dL)    Creatinine, Ser 8.11 (*) 0.50 - 1.35 (mg/dL)    Calcium 9.6  8.4 - 10.5 (mg/dL)    GFR calc non Af Amer 32 (*) >90 (mL/min)    GFR calc Af Amer 37 (*) >90 (mL/min)      HPI : The patient is a 78 year old man with a past medical history significant for diastolic dysfunction, pacemaker placement, stage III chronic kidney disease, and coronary artery disease. He presented to the emergency department on 11/09/2010 with a chief complaint of generalized weakness. He had been started on torsemide daily for bilateral lower extremity swelling by his cardiologist. He usually takes a torsemide a few times weekly. He presented with generalized weakness and postnasal drip with sinus pressure. In the emergency department, he was afebrile and hypertensive with a blood pressure 192/86. His lab data were  significant for a BUN of 31 and a creatinine of 2.34. His pro BNP was 2808. His troponin I was less than 0.30. His urinalysis was essentially negative. His chest x-ray revealed cardiomegaly and bibasilar opacities, likely atelectasis. He was admitted for further evaluation and management.  HOSPITAL COURSE: The patient was felt to be intravascularly depleted due to to recent increased diuresis from torsemide. Therefore, torsemide was discontinued and he was started on gentle IV fluids for hydration. He was continued on metoprolol.  Norvasc was continued, however, the dose was increased from 5 mg to 10 mg daily. For treatment of sinusitis, he was started on amoxicillin and Flonase. The physical therapist evaluated him and recommended ongoing therapy in a skilled environment. The patient and his wife agreed.  On hospital day #2, the patient developed some wheezing. A followup pro BNP and chest x-ray were ordered. The pro BNP was elevated but slightly improved at 2241. His followup chest x-ray was unchanged with bibasilar atelectasis. He was started on albuterol bronchodilator therapy with good response. IV fluids were titrated off.  Dr. Lendell Caprice discussed the patient's case with his cardiologist Dr. Tresa Endo. Apparently, the patient did have some bilateral lower extremity edema and torsemide was changed to every day rather than several times weekly. It was thought that bilateral lower extremity venous Dopplers were ordered in the outpatient setting, however, they were not. His lower extremity edema during the hospitalization was minimal and there was no clinical indication that he had thromboembolic disease.  The patient's blood pressure remained in the malignant range. As stated above, he was maintained on metoprolol and the dose of Norvasc was increased to 10 mg daily. Apparently, in the past, he had been resistant to any increase in antihypertensive medication dosing. In the past, apparently, he was treated with  hydralazine but it was discontinued. The reason why was unknown. Nevertheless, clonidine was added at when necessary dosing for a systolic blood pressure equal to or greater than 180. Further management will be deferred to the outpatient setting.  The patient's renal function and creatinine improved. At the time of discharge, his creatinine was 1.76, improved, but not quite back to baseline. Torsemide can be restarted, but on a when necessary basis only for an increase in lower extremity edema. Otherwise, he should continue to be gently hydrated with oral fluids. He will continue on amoxicillin and Flonase for 7 more days for treatment of acute sinusitis. He was afebrile and his white blood cell count was within normal limits during the hospitalization.    Discharge Exam: Blood pressure 186/86, pulse 71, temperature 98 F (36.7 C), temperature source Oral, resp. rate 19, height 5\' 10"  (1.778 m), weight 88.9 kg (195 lb 15.8 oz), SpO2 93.00%. Lungs: Rare wheezes. Heart: S1, S2, with a 2/6 systolic murmur. Abdomen: Obese, positive bowel sounds, soft, nontender, nondistended. Extremities: Trace of pedal edema bilaterally.    Discharge Orders    Future Orders Please Complete By Expires   Diet - low sodium heart healthy      Increase activity slowly         Follow-up Information    Follow up with GOLDING,JOHN CABOT, MD .        Total discharge time: 35 minutes.   Signed: Pasha Broad 11/13/2011, 11:21 AM

## 2011-11-13 NOTE — Progress Notes (Signed)
Physical Therapy Treatment Patient Details Name: [name] MRN: [id number] DOB: 04-Apr-1919 Today's Date: 11/13/2011  TIME:1048-1137; 23 mins TA  PT Assessment/Plan  PT - Assessment/Plan Comments on Treatment Session: very pleasant; very talkative patient. Able to stand with min-mod A today using RW;posterior lean puts pt at risk for falls; pt c/o fo feeling weak still, however pt was able to take 6-7 very small right laterl steps to positon at [facility] PT Goals  Acute Rehab PT Goals PT Goal: Supine/Side to Sit - Progress: Progressing toward goal PT Goal: Sit to Supine/Side - Progress: Progressing toward goal PT Goal: Sit to Stand - Progress: Progressing toward goal PT Transfer Goal: Bed to Chair/Chair to Bed - Progress: Progressing toward goal PT Goal: Ambulate - Progress: Not met  PT Treatment Precautions/Restrictions  Restrictions Weight Bearing Restrictions: No Mobility (including Balance) Bed Mobility Supine to Sit: 3: Mod assist Supine to Sit Details (indicate cue type and reason): assistance in pulling trunk forward and assistance with LLE Sitting - Scoot to Edge of Bed: 6: Modified independent (Device/Increase time) Transfers Transfers: Yes Sit to Stand: 3: Mod assist Sit to Stand Details (indicate cue type and reason): verbal cues to push from surface; assistance with balance upon standing due to anterior lean;verbal cues for hips ober BOS Stand to Sit: 4: Min assist Stand to Sit Details: assistance in controlling descent and verbal cues for hand placement Ambulation/Gait Ambulation/Gait: No (only 6-7 very small right lateral steps) Stairs: No Wheelchair Mobility Wheelchair Mobility: No    Exercise  General Exercises - Lower Extremity Toe Raises: Both;10 reps Heel Raises: 10 reps;Both Other Exercises Other Exercises: left and right lateral scooting at EOB-2' each direction Other Exercises: sit <>stand x3; standing with RW for 2-3' each time performing marching and  weight shifting in all directions End of Session PT - End of Session Equipment Utilized During Treatment: Gait belt Activity Tolerance: Patient tolerated treatment well Patient left: in bed;with call bell in reach;with bed alarm set  [name] 11/13/2011, 11:49 AM

## 2011-11-13 NOTE — Progress Notes (Signed)
Report called to [facility] at [facility] patient discharged to [facility] in stable condition.

## 2012-01-20 ENCOUNTER — Encounter (HOSPITAL_COMMUNITY): Payer: Self-pay

## 2012-01-22 ENCOUNTER — Encounter (HOSPITAL_COMMUNITY)
Admission: RE | Admit: 2012-01-22 | Discharge: 2012-01-22 | Disposition: A | Discharge status: Home / Self Care | Payer: Medicare Other | Source: Ambulatory Visit | Attending: Ophthalmology | Admitting: Ophthalmology

## 2012-01-22 ENCOUNTER — Other Ambulatory Visit: Payer: Self-pay

## 2012-01-22 ENCOUNTER — Encounter (HOSPITAL_COMMUNITY): Payer: Self-pay

## 2012-01-22 HISTORY — DX: Chronic obstructive pulmonary disease, unspecified: J44.9

## 2012-01-22 HISTORY — DX: Unspecified osteoarthritis, unspecified site: M19.90

## 2012-01-22 HISTORY — DX: Heart failure, unspecified: I50.9

## 2012-01-22 LAB — BASIC METABOLIC PANEL
CO2: 25 mEq/L (ref 19–32)
Chloride: 100 mEq/L (ref 96–112)
Potassium: 4.6 mEq/L (ref 3.5–5.1)
Sodium: 135 mEq/L (ref 135–145)

## 2012-01-22 LAB — HEMOGLOBIN AND HEMATOCRIT, BLOOD: HCT: 36.9 % — ABNORMAL LOW (ref 39.0–52.0)

## 2012-01-22 NOTE — Progress Notes (Signed)
01/22/12 1127  OBSTRUCTIVE SLEEP APNEA  Have you ever been diagnosed with sleep apnea through a sleep study? No  Do you snore loudly (loud enough to be heard through closed doors)?  1  Do you often feel tired, fatigued, or sleepy during the daytime? 1  Has anyone observed you stop breathing during your sleep? 0  Do you have, or are you being treated for high blood pressure? 1  BMI more than 35 kg/m2? 0  Age over 76 years old? 1  Neck circumference greater than 40 cm/18 inches? 0  Gender: 1  Obstructive Sleep Apnea Score 5   Score 4 or greater  Updated health history

## 2012-01-22 NOTE — Patient Instructions (Addendum)
20 [name]  01/22/2012   Your procedure is scheduled on:  02/03/2012  Report to [facility] at 900  AM.  Call this number if you have problems the morning of surgery: [phone]   Remember:   Do not eat food:After Midnight.  May have clear liquids:until Midnight .  Clear liquids include soda, tea, black coffee, apple or grape juice, broth.  Take these medicines the morning of surgery with A SIP OF WATER:  Amlodipine,clonidine,nexium,metoprolol. Take albuterol before you come.   Do not wear jewelry, make-up or nail polish.  Do not wear lotions, powders, or perfumes. You may wear deodorant.  Do not shave 48 hours prior to surgery.  Do not bring valuables to the hospital.  Contacts, dentures or bridgework may not be worn into surgery.  Leave suitcase in the car. After surgery it may be brought to your room.  For patients admitted to the hospital, checkout time is 11:00 AM the day of discharge.   Patients discharged the day of surgery will not be allowed to drive home.  Name and phone number of your driver: family  Special Instructions: N/A   Please read over the following fact sheets that you were given: Pain Booklet, MRSA Information, Surgical Site Infection Prevention, Anesthesia Post-op Instructions and Care and Recovery After Surgery Cataract A cataract is a clouding of the lens of the eye. When a lens becomes cloudy, vision is reduced based on the degree and nature of the clouding. Many cataracts reduce vision to some degree. Some cataracts make people more near-sighted as they develop. Other cataracts increase glare. Cataracts that are ignored and become worse can sometimes look white. The white color can be seen through the pupil. CAUSES   Aging. However, cataracts may occur at any age, even in newborns.   Certain drugs.   Trauma to the eye.   Certain diseases such as diabetes.   Specific eye diseases such as chronic inflammation inside the eye or a sudden attack of a rare  form of glaucoma.   Inherited or acquired medical problems.  SYMPTOMS   Gradual, progressive drop in vision in the affected eye.   Severe, rapid visual loss. This most often happens when trauma is the cause.  DIAGNOSIS  To detect a cataract, an eye doctor examines the lens. Cataracts are best diagnosed with an exam of the eyes with the pupils enlarged (dilated) by drops.  TREATMENT  For an early cataract, vision may improve by using different eyeglasses or stronger lighting. If that does not help your vision, surgery is the only effective treatment. A cataract needs to be surgically removed when vision loss interferes with your everyday activities, such as driving, reading, or watching TV. A cataract may also have to be removed if it prevents examination or treatment of another eye problem. Surgery removes the cloudy lens and usually replaces it with a substitute lens (intraocular lens, IOL).  At a time when both you and your doctor agree, the cataract will be surgically removed. If you have cataracts in both eyes, only one is usually removed at a time. This allows the operated eye to heal and be out of danger from any possible problems after surgery (such as infection or poor wound healing). In rare cases, a cataract may be doing damage to your eye. In these cases, your caregiver may advise surgical removal right away. The vast majority of people who have cataract surgery have better vision afterward. HOME CARE INSTRUCTIONS  If you are  not planning surgery, you may be asked to do the following:  Use different eyeglasses.   Use stronger or brighter lighting.   Ask your eye doctor about reducing your medicine dose or changing medicines if it is thought that a medicine caused your cataract. Changing medicines does not make the cataract go away on its own.   Become familiar with your surroundings. Poor vision can lead to injury. Avoid bumping into things on the affected side. You are at a higher  risk for tripping or falling.   Exercise extreme care when driving or operating machinery.   Wear sunglasses if you are sensitive to bright light or experiencing problems with glare.  SEEK IMMEDIATE MEDICAL CARE IF:   You have a worsening or sudden vision loss.   You notice redness, swelling, or increasing pain in the eye.   You have a fever.  Document Released: 10/13/2005 Document Revised: 10/02/2011 Document Reviewed: 06/06/2011 [facility] Patient Information 2012 [place], [place].PATIENT INSTRUCTIONS POST-ANESTHESIA  IMMEDIATELY FOLLOWING SURGERY:  Do not drive or operate machinery for the first twenty four hours after surgery.  Do not make any important decisions for twenty four hours after surgery or while taking narcotic pain medications or sedatives.  If you develop intractable nausea and vomiting or a severe headache please notify your doctor immediately.  FOLLOW-UP:  Please make an appointment with your surgeon as instructed. You do not need to follow up with anesthesia unless specifically instructed to do so.  WOUND CARE INSTRUCTIONS (if applicable):  Keep a dry clean dressing on the anesthesia/puncture wound site if there is drainage.  Once the wound has quit draining you may leave it open to air.  Generally you should leave the bandage intact for twenty four hours unless there is drainage.  If the epidural site drains for more than 36-48 hours please call the anesthesia department.  QUESTIONS?:  Please feel free to call your physician or the hospital operator if you have any questions, and they will be happy to assist you.     [facility] Anesthesia Department [address] [place] [phone]

## 2012-01-29 ENCOUNTER — Encounter (HOSPITAL_COMMUNITY): Payer: Medicare Other

## 2012-02-02 MED ORDER — TETRACAINE HCL 0.5 % OP SOLN
OPHTHALMIC | Status: AC
  Administered 2012-02-03: 1 [drp] via OPHTHALMIC
  Filled 2012-02-02: qty 2

## 2012-02-02 MED ORDER — PHENYLEPHRINE HCL 2.5 % OP SOLN
OPHTHALMIC | Status: AC
  Administered 2012-02-03: 1 [drp] via OPHTHALMIC
  Filled 2012-02-02: qty 2

## 2012-02-02 MED ORDER — FLURBIPROFEN SODIUM 0.03 % OP SOLN
OPHTHALMIC | Status: AC
  Administered 2012-02-03: 1 [drp] via OPHTHALMIC
  Filled 2012-02-02: qty 2.5

## 2012-02-02 MED ORDER — CYCLOPENTOLATE-PHENYLEPHRINE 0.2-1 % OP SOLN
OPHTHALMIC | Status: AC
  Administered 2012-02-03: 1 [drp] via OPHTHALMIC
  Filled 2012-02-02: qty 2

## 2012-02-02 MED ORDER — KETOROLAC TROMETHAMINE 0.5 % OP SOLN: 1.0000 [drp] | OPHTHALMIC | Status: DC

## 2012-02-03 ENCOUNTER — Ambulatory Visit (HOSPITAL_COMMUNITY)
Admission: RE | Admit: 2012-02-03 | Discharge: 2012-02-03 | Disposition: A | Discharge status: Home / Self Care | Payer: Medicare Other | Source: Ambulatory Visit | Attending: Ophthalmology | Admitting: Ophthalmology

## 2012-02-03 ENCOUNTER — Encounter (HOSPITAL_COMMUNITY): Payer: Self-pay

## 2012-02-03 ENCOUNTER — Ambulatory Visit (HOSPITAL_COMMUNITY): Payer: Medicare Other | Admitting: Anesthesiology

## 2012-02-03 ENCOUNTER — Encounter (HOSPITAL_COMMUNITY): Payer: Self-pay | Admitting: Anesthesiology

## 2012-02-03 ENCOUNTER — Encounter (HOSPITAL_COMMUNITY)
Admission: RE | Disposition: A | Discharge status: Home / Self Care | Payer: Self-pay | Source: Ambulatory Visit | Attending: Ophthalmology

## 2012-02-03 DIAGNOSIS — Z01810 Encounter for preprocedural cardiovascular examination: Secondary | ICD-10-CM | POA: Insufficient documentation

## 2012-02-03 DIAGNOSIS — Z79899 Other long term (current) drug therapy: Secondary | ICD-10-CM | POA: Insufficient documentation

## 2012-02-03 DIAGNOSIS — I10 Essential (primary) hypertension: Secondary | ICD-10-CM | POA: Insufficient documentation

## 2012-02-03 DIAGNOSIS — J449 Chronic obstructive pulmonary disease, unspecified: Secondary | ICD-10-CM | POA: Insufficient documentation

## 2012-02-03 DIAGNOSIS — Z01812 Encounter for preprocedural laboratory examination: Secondary | ICD-10-CM | POA: Insufficient documentation

## 2012-02-03 DIAGNOSIS — H2510 Age-related nuclear cataract, unspecified eye: Secondary | ICD-10-CM | POA: Insufficient documentation

## 2012-02-03 DIAGNOSIS — J4489 Other specified chronic obstructive pulmonary disease: Secondary | ICD-10-CM | POA: Insufficient documentation

## 2012-02-03 HISTORY — PX: CATARACT EXTRACTION W/PHACO: SHX586

## 2012-02-03 SURGERY — PHACOEMULSIFICATION, CATARACT, WITH IOL INSERTION
Anesthesia: Monitor Anesthesia Care | Site: Eye | Laterality: Left | Wound class: Clean

## 2012-02-03 MED ORDER — BSS IO SOLN
INTRAOCULAR | Status: DC | PRN
  Administered 2012-02-03: 15 mL via INTRAOCULAR

## 2012-02-03 MED ORDER — EPINEPHRINE HCL 1 MG/ML IJ SOLN
INTRAMUSCULAR | Status: AC
  Filled 2012-02-03: qty 1

## 2012-02-03 MED ORDER — PHENYLEPHRINE HCL 2.5 % OP SOLN
1.0000 [drp] | OPHTHALMIC | Status: AC
  Administered 2012-02-03 (×3): 1 [drp] via OPHTHALMIC

## 2012-02-03 MED ORDER — TETRACAINE HCL 0.5 % OP SOLN
1.0000 [drp] | OPHTHALMIC | Status: AC
  Administered 2012-02-03 (×3): 1 [drp] via OPHTHALMIC

## 2012-02-03 MED ORDER — MIDAZOLAM HCL 2 MG/2ML IJ SOLN
1.0000 mg | INTRAMUSCULAR | Status: DC | PRN
  Administered 2012-02-03: 1 mg via INTRAVENOUS

## 2012-02-03 MED ORDER — EPINEPHRINE HCL 1 MG/ML IJ SOLN
INTRAOCULAR | Status: DC | PRN
  Administered 2012-02-03: 10:00:00

## 2012-02-03 MED ORDER — FENTANYL CITRATE 0.05 MG/ML IJ SOLN: 25.0000 ug | INTRAMUSCULAR | Status: DC | PRN

## 2012-02-03 MED ORDER — FLURBIPROFEN SODIUM 0.03 % OP SOLN
1.0000 [drp] | OPHTHALMIC | Status: AC
  Administered 2012-02-03 (×3): 1 [drp] via OPHTHALMIC

## 2012-02-03 MED ORDER — LACTATED RINGERS IV SOLN
INTRAVENOUS | Status: DC
  Administered 2012-02-03: 10:00:00 via INTRAVENOUS

## 2012-02-03 MED ORDER — TETRACAINE 0.5 % OP SOLN OPTIME - NO CHARGE
OPHTHALMIC | Status: DC | PRN
  Administered 2012-02-03: 1 [drp] via OPHTHALMIC

## 2012-02-03 MED ORDER — ONDANSETRON HCL 4 MG/2ML IJ SOLN: 4.0000 mg | Freq: Once | INTRAMUSCULAR | Status: DC | PRN

## 2012-02-03 MED ORDER — MIDAZOLAM HCL 2 MG/2ML IJ SOLN
INTRAMUSCULAR | Status: AC
  Filled 2012-02-03: qty 2

## 2012-02-03 MED ORDER — PROVISC 10 MG/ML IO SOLN
INTRAOCULAR | Status: DC | PRN
  Administered 2012-02-03: 8.5 mg via INTRAOCULAR

## 2012-02-03 MED ORDER — CYCLOPENTOLATE-PHENYLEPHRINE 0.2-1 % OP SOLN
1.0000 [drp] | OPHTHALMIC | Status: AC
  Administered 2012-02-03 (×3): 1 [drp] via OPHTHALMIC

## 2012-02-03 SURGICAL SUPPLY — 24 items
CAPSULAR TENSION RING-AMO (OPHTHALMIC RELATED) IMPLANT
CLOTH BEACON ORANGE TIMEOUT ST (SAFETY) ×1 IMPLANT
EYE SHIELD UNIVERSAL CLEAR (GAUZE/BANDAGES/DRESSINGS) ×1 IMPLANT
GLOVE BIO SURGEON STRL SZ 6.5 (GLOVE) ×1 IMPLANT
GLOVE BIOGEL M 6.5 STRL (GLOVE) IMPLANT
GLOVE ECLIPSE 6.5 STRL STRAW (GLOVE) IMPLANT
GLOVE ECLIPSE 7.0 STRL STRAW (GLOVE) IMPLANT
GLOVE EXAM NITRILE LRG STRL (GLOVE) IMPLANT
GLOVE EXAM NITRILE MD LF STRL (GLOVE) ×1 IMPLANT
GLOVE SKINSENSE NS SZ6.5 (GLOVE)
GLOVE SKINSENSE STRL SZ6.5 (GLOVE) IMPLANT
HEALON 5 0.6 ML (INTRAOCULAR LENS) IMPLANT
KIT VITRECTOMY (OPHTHALMIC RELATED) IMPLANT
PAD ARMBOARD 7.5X6 YLW CONV (MISCELLANEOUS) ×1 IMPLANT
PROC W NO LENS (INTRAOCULAR LENS)
PROC W SPEC LENS (INTRAOCULAR LENS)
PROCESS W NO LENS (INTRAOCULAR LENS) IMPLANT
PROCESS W SPEC LENS (INTRAOCULAR LENS) IMPLANT
RING MALYGIN (MISCELLANEOUS) IMPLANT
SIGHTPATH CAT PROC W REG LENS (Ophthalmic Related) ×2 IMPLANT
TAPE SURG TRANSPORE 1 IN (GAUZE/BANDAGES/DRESSINGS) IMPLANT
TAPE SURGICAL TRANSPORE 1 IN (GAUZE/BANDAGES/DRESSINGS) ×1
VISCOELASTIC ADDITIONAL (OPHTHALMIC RELATED) IMPLANT
WATER STERILE IRR 250ML POUR (IV SOLUTION) ×1 IMPLANT

## 2012-02-03 NOTE — H&P (Signed)
The patient was re examined and there is no change in the patients condition since the original H and P. 

## 2012-02-03 NOTE — Anesthesia Preprocedure Evaluation (Signed)
Anesthesia Evaluation  Patient identified by MRN, date of birth, ID band Patient awake    Reviewed: Allergy & Precautions, H&P , NPO status , Patient's Chart, lab work & pertinent test results  Airway Mallampati: II      Dental  (+) Teeth Intact   Pulmonary COPD breath sounds clear to auscultation        Cardiovascular hypertension, + CAD, + Past MI and +CHF + dysrhythmias + pacemaker Rhythm:Regular Rate:Normal     Neuro/Psych CVA    GI/Hepatic GERD-  ,  Endo/Other    Renal/GU Renal InsufficiencyRenal disease     Musculoskeletal   Abdominal   Peds  Hematology   Anesthesia Other Findings   Reproductive/Obstetrics                           Anesthesia Physical Anesthesia Plan  ASA: IV  Anesthesia Plan: MAC   Post-op Pain Management:    Induction: Intravenous  Airway Management Planned: Nasal Cannula  Additional Equipment:   Intra-op Plan:   Post-operative Plan:   Informed Consent: I have reviewed the patients History and Physical, chart, labs and discussed the procedure including the risks, benefits and alternatives for the proposed anesthesia with the patient or authorized representative who has indicated his/her understanding and acceptance.     Plan Discussed with:   Anesthesia Plan Comments:         Anesthesia Quick Evaluation  

## 2012-02-03 NOTE — Anesthesia Postprocedure Evaluation (Signed)
  Anesthesia Post-op Note  Patient: [name]  Procedure(s) Performed: Procedure(s) (LRB): CATARACT EXTRACTION PHACO AND INTRAOCULAR LENS PLACEMENT (IOC) (Left)  Patient Location: PACU and Short Stay  Anesthesia Type: MAC  Level of Consciousness: awake, alert  and oriented  Airway and Oxygen Therapy: Patient Spontanous Breathing  Post-op Pain: none  Post-op Assessment: Post-op Vital signs reviewed, Patient's Cardiovascular Status Stable, Respiratory Function Stable, Patent Airway and No signs of Nausea or vomiting  Post-op Vital Signs: Reviewed and stable  Complications: No apparent anesthesia complications

## 2012-02-03 NOTE — Op Note (Signed)
Patient brought to the operating room and prepped and draped in the usual manner.  Lid speculum inserted in left eye.  Stab incision made at the twelve o'clock position.  Provisc instilled in the anterior chamber.   A 2.4 mm. Stab incision was made temporally.  An anterior capsulotomy was done with a bent 25 gauge needle.  The nucleus was hydrodissected.  The Phaco tip was inserted in the anterior chamber and the nucleus was emulsified.  CDE was 29.12.  The cortical material was then removed with the I and A tip.  Posterior capsule was the polished.  The anterior chamber was deepened with Provisc.  A 21.0 Diopter Rayner 570C IOL was then inserted in the capsular bag.  Provisc was then removed with the I and A tip.  The wound was then hydrated.  Patient sent to the Recovery Room in good condition with follow up in my office.

## 2012-02-03 NOTE — Discharge Instructions (Signed)
MADDON HORTON  02/03/2012           Mollie Germany Care Instructions [address] [place] [address]      1. Avoid closing eyes tightly. One often closes the eye tightly when laughing, talking, sneezing, coughing or if they feel irritated. At these times, you should be careful not to close your eyes tightly.  2. Instill one drop [place] in operative eye 3 times each day until the bottle is finished. To instill drops in your eye, open it, look up and have someone gently pull the lower lid down and instill a couple of drops inside the lower lid.  3. Do not touch upper lid.  4. Take Advil or Tylenol for pain.  5. You may use either eye for near work, such as reading or sewing and you may watch television.  6. You may have your hair done at the beauty parlor at any time.  7. Wear dark glasses with or without your own glasses if you are in bright light.  8. Call our office at [phone] or [phone] if you have sharp pain in your eye or unusual symptoms.  9. Do not be concerned because vision in the operative eye is not good. It will not be good, no matter how successful the operation, until you get a special lens for it. Your old glasses will not be suited to the new eye that was operated on and you will not be ready for a new lens for about a month.  10. Follow up at the [facility] office.    I have received a copy of the above instructions and will follow them.

## 2012-02-03 NOTE — Transfer of Care (Signed)
Immediate Anesthesia Transfer of Care Note  Patient: [name]  Procedure(s) Performed: Procedure(s) (LRB): CATARACT EXTRACTION PHACO AND INTRAOCULAR LENS PLACEMENT (IOC) (Left)  Patient Location: PACU and Short Stay  Anesthesia Type: MAC  Level of Consciousness: awake, alert  and oriented  Airway & Oxygen Therapy: Patient Spontanous Breathing  Post-op Assessment: Report given to PACU RN  Post vital signs: Reviewed and stable  Complications: No apparent anesthesia complications

## 2012-02-05 ENCOUNTER — Encounter (HOSPITAL_COMMUNITY): Payer: Self-pay | Admitting: Ophthalmology

## 2012-03-16 ENCOUNTER — Encounter (HOSPITAL_COMMUNITY)
Admission: RE | Admit: 2012-03-16 | Discharge: 2012-03-16 | Disposition: A | Discharge status: Home / Self Care | Payer: Medicare Other | Source: Ambulatory Visit | Attending: Ophthalmology | Admitting: Ophthalmology

## 2012-03-16 ENCOUNTER — Encounter (HOSPITAL_COMMUNITY): Payer: Self-pay

## 2012-03-16 HISTORY — DX: Weakness: R53.1

## 2012-03-16 HISTORY — DX: Edema, unspecified: R60.9

## 2012-03-16 HISTORY — DX: Localized edema: R60.0

## 2012-03-16 LAB — BASIC METABOLIC PANEL
Calcium: 9.5 mg/dL (ref 8.4–10.5)
Creatinine, Ser: 2.56 mg/dL — ABNORMAL HIGH (ref 0.50–1.35)
GFR calc non Af Amer: 20 mL/min — ABNORMAL LOW (ref >90)
Glucose, Bld: 106 mg/dL — ABNORMAL HIGH (ref 70–99)
Sodium: 136 mEq/L (ref 135–145)

## 2012-03-16 LAB — HEMOGLOBIN AND HEMATOCRIT, BLOOD: HCT: 35.3 % — ABNORMAL LOW (ref 39.0–52.0)

## 2012-03-16 NOTE — Patient Instructions (Signed)
Your procedure is scheduled on:  Tuesday, 03/23/12  Report to [facility] at   0700  AM.  Call this number if you have problems the morning of surgery: [phone]   Remember:   Do not eat or drink   After Midnight.  Take these medicines the morning of surgery with A SIP OF WATER: apresoline, metoprolol, omeprazole, diltiazem   Do not wear jewelry, make-up or nail polish.  Do not wear lotions, powders, or perfumes. You may wear deodorant.  Do not bring valuables to the hospital.  Contacts, dentures or bridgework may not be worn into surgery.     Patients discharged the day of surgery will not be allowed to drive home.  Name and phone number of your driver: family  Special Instructions: Use eye drops as directed.   Please read over the following fact sheets that you were given: Pain Booklet, Anesthesia Post-op Instructions and Care and Recovery After Surgery    Cataract Surgery  A cataract is a clouding of the lens of the eye. When a lens becomes cloudy, vision is reduced based on the degree and nature of the clouding. Surgery may be needed to improve vision. Surgery removes the cloudy lens and usually replaces it with a substitute lens (intraocular lens, IOL). LET YOUR EYE DOCTOR KNOW ABOUT:  Allergies to food or medicine.   Medicines taken including herbs, eyedrops, over-the-counter medicines, and creams.   Use of steroids (by mouth or creams).   Previous problems with anesthetics or numbing medicine.   History of bleeding problems or blood clots.   Previous surgery.   Other health problems, including diabetes and kidney problems.   Possibility of pregnancy, if this applies.  RISKS AND COMPLICATIONS  Infection.   Inflammation of the eyeball (endophthalmitis) that can spread to both eyes (sympathetic ophthalmia).   Poor wound healing.   If an IOL is inserted, it can later fall out of proper position. This is very uncommon.   Clouding of the part of your eye that holds an  IOL in place. This is called an "after-cataract." These are uncommon, but easily treated.  BEFORE THE PROCEDURE  Do not eat or drink anything except small amounts of water for 8 to 12 before your surgery, or as directed by your caregiver.   Unless you are told otherwise, continue any eyedrops you have been prescribed.   Talk to your primary caregiver about all other medicines that you take (both prescription and non-prescription). In some cases, you may need to stop or change medicines near the time of your surgery. This is most important if you are taking blood-thinning medicine.Do not stop medicines unless you are told to do so.   Arrange for someone to drive you to and from the procedure.   Do not put contact lenses in either eye on the day of your surgery.  PROCEDURE There is more than one method for safely removing a cataract. Your doctor can explain the differences and help determine which is best for you. Phacoemulsification surgery is the most common form of cataract surgery.  An injection is given behind the eye or eyedrops are given to make this a painless procedure.   A small cut (incision) is made on the edge of the clear, dome-shaped surface that covers the front of the eye (cornea).   A tiny probe is painlessly inserted into the eye. This device gives off ultrasound waves that soften and break up the cloudy center of the lens. This makes it  easier for the cloudy lens to be removed by suction.   An IOL may be implanted.   The normal lens of the eye is covered by a clear capsule. Part of that capsule is intentionally left in the eye to support the IOL.   Your surgeon may or may not use stitches to close the incision.  There are other forms of cataract surgery that require a larger incision and stiches to close the eye. This approach is taken in cases where the doctor feels that the cataract cannot be easily removed using phacoemulsification. AFTER THE PROCEDURE  When an  IOL is implanted, it does not need care. It becomes a permanent part of your eye and cannot be seen or felt.   Your doctor will schedule follow-up exams to check on your progress.   Review your other medicines with your doctor to see which can be resumed after surgery.   Use eyedrops or take medicine as prescribed by your doctor.  Document Released: 10/02/2011 Document Reviewed: 09/29/2011 [facility] Patient Information 2012 [place], [place].  PATIENT INSTRUCTIONS POST-ANESTHESIA  IMMEDIATELY FOLLOWING SURGERY:  Do not drive or operate machinery for the first twenty four hours after surgery.  Do not make any important decisions for twenty four hours after surgery or while taking narcotic pain medications or sedatives.  If you develop intractable nausea and vomiting or a severe headache please notify your doctor immediately.  FOLLOW-UP:  Please make an appointment with your surgeon as instructed. You do not need to follow up with anesthesia unless specifically instructed to do so.  WOUND CARE INSTRUCTIONS (if applicable):  Keep a dry clean dressing on the anesthesia/puncture wound site if there is drainage.  Once the wound has quit draining you may leave it open to air.  Generally you should leave the bandage intact for twenty four hours unless there is drainage.  If the epidural site drains for more than 36-48 hours please call the anesthesia department.  QUESTIONS?:  Please feel free to call your physician or the hospital operator if you have any questions, and they will be happy to assist you.

## 2012-03-17 ENCOUNTER — Encounter (HOSPITAL_COMMUNITY): Admission: RE | Admit: 2012-03-17 | Payer: Medicare Other | Source: Ambulatory Visit

## 2012-03-23 ENCOUNTER — Ambulatory Visit (HOSPITAL_COMMUNITY)
Admission: RE | Admit: 2012-03-23 | Discharge: 2012-03-23 | Disposition: A | Discharge status: Home / Self Care | Payer: Medicare Other | Source: Ambulatory Visit | Attending: Ophthalmology | Admitting: Ophthalmology

## 2012-03-23 ENCOUNTER — Encounter (HOSPITAL_COMMUNITY): Payer: Self-pay | Admitting: *Deleted

## 2012-03-23 ENCOUNTER — Encounter (HOSPITAL_COMMUNITY): Payer: Self-pay | Admitting: Anesthesiology

## 2012-03-23 ENCOUNTER — Encounter (HOSPITAL_COMMUNITY)
Admission: RE | Disposition: A | Discharge status: Home / Self Care | Payer: Self-pay | Source: Ambulatory Visit | Attending: Ophthalmology

## 2012-03-23 ENCOUNTER — Ambulatory Visit (HOSPITAL_COMMUNITY): Payer: Medicare Other | Admitting: Anesthesiology

## 2012-03-23 DIAGNOSIS — J4489 Other specified chronic obstructive pulmonary disease: Secondary | ICD-10-CM | POA: Insufficient documentation

## 2012-03-23 DIAGNOSIS — Z01812 Encounter for preprocedural laboratory examination: Secondary | ICD-10-CM | POA: Insufficient documentation

## 2012-03-23 DIAGNOSIS — J449 Chronic obstructive pulmonary disease, unspecified: Secondary | ICD-10-CM | POA: Insufficient documentation

## 2012-03-23 DIAGNOSIS — I2510 Atherosclerotic heart disease of native coronary artery without angina pectoris: Secondary | ICD-10-CM | POA: Insufficient documentation

## 2012-03-23 DIAGNOSIS — Z79899 Other long term (current) drug therapy: Secondary | ICD-10-CM | POA: Insufficient documentation

## 2012-03-23 DIAGNOSIS — I10 Essential (primary) hypertension: Secondary | ICD-10-CM | POA: Insufficient documentation

## 2012-03-23 DIAGNOSIS — Z950 Presence of cardiac pacemaker: Secondary | ICD-10-CM | POA: Insufficient documentation

## 2012-03-23 DIAGNOSIS — H2510 Age-related nuclear cataract, unspecified eye: Secondary | ICD-10-CM | POA: Insufficient documentation

## 2012-03-23 HISTORY — PX: CATARACT EXTRACTION W/PHACO: SHX586

## 2012-03-23 SURGERY — PHACOEMULSIFICATION, CATARACT, WITH IOL INSERTION
Anesthesia: Monitor Anesthesia Care | Site: Eye | Laterality: Right | Wound class: Clean

## 2012-03-23 MED ORDER — TETRACAINE HCL 0.5 % OP SOLN
1.0000 [drp] | OPHTHALMIC | Status: AC
  Administered 2012-03-23 (×3): 1 [drp] via OPHTHALMIC

## 2012-03-23 MED ORDER — PHENYLEPHRINE HCL 2.5 % OP SOLN
1.0000 [drp] | OPHTHALMIC | Status: AC
  Administered 2012-03-23 (×3): 1 [drp] via OPHTHALMIC

## 2012-03-23 MED ORDER — TETRACAINE 0.5 % OP SOLN OPTIME - NO CHARGE
OPHTHALMIC | Status: DC | PRN
  Administered 2012-03-23: 2 [drp] via OPHTHALMIC

## 2012-03-23 MED ORDER — MIDAZOLAM HCL 2 MG/2ML IJ SOLN
INTRAMUSCULAR | Status: AC
  Administered 2012-03-23: 2 mg via INTRAVENOUS
  Filled 2012-03-23: qty 2

## 2012-03-23 MED ORDER — PHENYLEPHRINE HCL 2.5 % OP SOLN
OPHTHALMIC | Status: AC
  Administered 2012-03-23: 1 [drp] via OPHTHALMIC
  Filled 2012-03-23: qty 2

## 2012-03-23 MED ORDER — TETRACAINE HCL 0.5 % OP SOLN
OPHTHALMIC | Status: AC
  Administered 2012-03-23: 1 [drp] via OPHTHALMIC
  Filled 2012-03-23: qty 2

## 2012-03-23 MED ORDER — CYCLOPENTOLATE-PHENYLEPHRINE 0.2-1 % OP SOLN
OPHTHALMIC | Status: AC
  Administered 2012-03-23: 1 [drp] via OPHTHALMIC
  Filled 2012-03-23: qty 2

## 2012-03-23 MED ORDER — FLURBIPROFEN SODIUM 0.03 % OP SOLN
OPHTHALMIC | Status: AC
  Administered 2012-03-23: 1 [drp] via OPHTHALMIC
  Filled 2012-03-23: qty 2.5

## 2012-03-23 MED ORDER — BSS IO SOLN
INTRAOCULAR | Status: DC | PRN
  Administered 2012-03-23: 15 mL via INTRAOCULAR

## 2012-03-23 MED ORDER — MIDAZOLAM HCL 2 MG/2ML IJ SOLN
1.0000 mg | INTRAMUSCULAR | Status: DC | PRN
  Administered 2012-03-23: 2 mg via INTRAVENOUS

## 2012-03-23 MED ORDER — PROVISC 10 MG/ML IO SOLN
INTRAOCULAR | Status: DC | PRN
  Administered 2012-03-23: 8.5 mg via INTRAOCULAR

## 2012-03-23 MED ORDER — FLURBIPROFEN SODIUM 0.03 % OP SOLN
1.0000 [drp] | OPHTHALMIC | Status: AC
  Administered 2012-03-23 (×3): 1 [drp] via OPHTHALMIC

## 2012-03-23 MED ORDER — CYCLOPENTOLATE-PHENYLEPHRINE 0.2-1 % OP SOLN
1.0000 [drp] | OPHTHALMIC | Status: AC
  Administered 2012-03-23 (×3): 1 [drp] via OPHTHALMIC

## 2012-03-23 MED ORDER — LACTATED RINGERS IV SOLN
INTRAVENOUS | Status: DC
  Administered 2012-03-23: 1000 mL via INTRAVENOUS

## 2012-03-23 MED ORDER — EPINEPHRINE HCL 1 MG/ML IJ SOLN
INTRAOCULAR | Status: DC | PRN
  Administered 2012-03-23: 09:00:00

## 2012-03-23 SURGICAL SUPPLY — 23 items
CAPSULAR TENSION RING-AMO (OPHTHALMIC RELATED) IMPLANT
CLOTH BEACON ORANGE TIMEOUT ST (SAFETY) ×1 IMPLANT
EYE SHIELD UNIVERSAL CLEAR (GAUZE/BANDAGES/DRESSINGS) ×2 IMPLANT
GLOVE BIO SURGEON STRL SZ 6.5 (GLOVE) ×1 IMPLANT
GLOVE ECLIPSE 6.5 STRL STRAW (GLOVE) IMPLANT
GLOVE ECLIPSE 7.0 STRL STRAW (GLOVE) IMPLANT
GLOVE EXAM NITRILE LRG STRL (GLOVE) IMPLANT
GLOVE EXAM NITRILE MD LF STRL (GLOVE) ×1 IMPLANT
GLOVE SKINSENSE NS SZ6.5 (GLOVE)
GLOVE SKINSENSE STRL SZ6.5 (GLOVE) IMPLANT
HEALON 5 0.6 ML (INTRAOCULAR LENS) IMPLANT
KIT VITRECTOMY (OPHTHALMIC RELATED) IMPLANT
PAD ARMBOARD 7.5X6 YLW CONV (MISCELLANEOUS) ×1 IMPLANT
PROC W NO LENS (INTRAOCULAR LENS)
PROC W SPEC LENS (INTRAOCULAR LENS)
PROCESS W NO LENS (INTRAOCULAR LENS) IMPLANT
PROCESS W SPEC LENS (INTRAOCULAR LENS) IMPLANT
RING MALYGIN (MISCELLANEOUS) IMPLANT
SIGHTPATH CAT PROC W REG LENS (Ophthalmic Related) ×2 IMPLANT
TAPE SURG TRANSPORE 1 IN (GAUZE/BANDAGES/DRESSINGS) IMPLANT
TAPE SURGICAL TRANSPORE 1 IN (GAUZE/BANDAGES/DRESSINGS) ×1
VISCOELASTIC ADDITIONAL (OPHTHALMIC RELATED) IMPLANT
WATER STERILE IRR 250ML POUR (IV SOLUTION) ×1 IMPLANT

## 2012-03-23 NOTE — Anesthesia Postprocedure Evaluation (Signed)
  Anesthesia Post-op Note  Patient: [name]  Procedure(s) Performed: Procedure(s) (LRB): CATARACT EXTRACTION PHACO AND INTRAOCULAR LENS PLACEMENT (IOC) (Right)  Patient Location: PACU and Short Stay  Anesthesia Type: MAC  Level of Consciousness: awake, alert  and oriented  Airway and Oxygen Therapy: Patient Spontanous Breathing  Post-op Pain: none  Post-op Assessment: Post-op Vital signs reviewed, Patient's Cardiovascular Status Stable, Respiratory Function Stable, Patent Airway and No signs of Nausea or vomiting  Post-op Vital Signs: Reviewed and stable  Complications: No apparent anesthesia complications

## 2012-03-23 NOTE — H&P (Signed)
The patient was re examined and there is no change in the patients condition since the original H and P. 

## 2012-03-23 NOTE — Discharge Instructions (Signed)
[name]  03/23/2012           [facility] Instructions [address] [place] [address]      1. Avoid closing eyes tightly. One often closes the eye tightly when laughing, talking, sneezing, coughing or if they feel irritated. At these times, you should be careful not to close your eyes tightly.  2. Instill eye drops as instructed. To instill drops in your eye, open it, look up and have someone gently pull the lower lid down and instill a couple of drops inside the lower lid.  3. Do not touch upper lid.  4. Take Advil or Tylenol for pain.  5. You may use either eye for near work, such as reading or sewing and you may watch television.  6. You may have your hair done at the beauty parlor at any time.  7. Wear dark glasses with or without your own glasses if you are in bright light.  8. Call our office at [phone] or [phone] if you have sharp pain in your eye or unusual symptoms.  9. Do not be concerned because vision in the operative eye is not good. It will not be good, no matter how successful the operation, until you get a special lens for it. Your old glasses will not be suited to the new eye that was operated on and you will not be ready for a new lens for about a month.  10. Follow up at the [facility] office today @ 2:30-3:00pm.    I have received a copy of the above instructions and will follow them.

## 2012-03-23 NOTE — Anesthesia Preprocedure Evaluation (Signed)
Anesthesia Evaluation  Patient identified by MRN, date of birth, ID band Patient awake    Reviewed: Allergy & Precautions, H&P , NPO status , Patient's Chart, lab work & pertinent test results  Airway Mallampati: II      Dental  (+) Teeth Intact   Pulmonary COPD breath sounds clear to auscultation        Cardiovascular hypertension, + CAD, + Past MI and +CHF + dysrhythmias + pacemaker Rhythm:Regular Rate:Normal     Neuro/Psych CVA    GI/Hepatic GERD-  ,  Endo/Other    Renal/GU Renal InsufficiencyRenal disease     Musculoskeletal   Abdominal   Peds  Hematology   Anesthesia Other Findings   Reproductive/Obstetrics                           Anesthesia Physical Anesthesia Plan  ASA: IV  Anesthesia Plan: MAC   Post-op Pain Management:    Induction: Intravenous  Airway Management Planned: Nasal Cannula  Additional Equipment:   Intra-op Plan:   Post-operative Plan:   Informed Consent: I have reviewed the patients History and Physical, chart, labs and discussed the procedure including the risks, benefits and alternatives for the proposed anesthesia with the patient or authorized representative who has indicated his/her understanding and acceptance.     Plan Discussed with:   Anesthesia Plan Comments:         Anesthesia Quick Evaluation

## 2012-03-23 NOTE — Transfer of Care (Signed)
Immediate Anesthesia Transfer of Care Note  Patient: [name]  Procedure(s) Performed: Procedure(s) (LRB): CATARACT EXTRACTION PHACO AND INTRAOCULAR LENS PLACEMENT (IOC) (Right)  Patient Location: PACU and Short Stay  Anesthesia Type: MAC  Level of Consciousness: awake, alert  and oriented  Airway & Oxygen Therapy: Patient Spontanous Breathing  Post-op Assessment: Report given to PACU RN  Post vital signs: Reviewed and stable  Complications: No apparent anesthesia complications

## 2012-03-23 NOTE — Addendum Note (Signed)
Addendum  created 03/23/12 1214 by Franco Nones, CRNA   Modules edited:Charges VN

## 2012-03-23 NOTE — Op Note (Signed)
Patient brought to the operating room and prepped and draped in the usual manner.  Lid speculum inserted in right eye.  Stab incision made at the twelve o'clock position.  Provisc instilled in the anterior chamber.   A 2.4 mm. Stab incision was made temporally.  An anterior capsulotomy was done with a bent 25 gauge needle.  The nucleus was hydrodissected.  The Phaco tip was inserted in the anterior chamber and the nucleus was emulsified.  CDE was 22.98.  The cortical material was then removed with the I and A tip.  Posterior capsule was the polished.  The anterior chamber was deepened with Provisc.  A 21.5 Diopter Rayner 570C IOL was then inserted in the capsular bag.  Provisc was then removed with the I and A tip.  The wound was then hydrated.  Patient sent to the Recovery Room in good condition with follow up in my office.

## 2012-03-25 ENCOUNTER — Encounter (HOSPITAL_COMMUNITY): Payer: Self-pay | Admitting: Ophthalmology

## 2013-03-29 ENCOUNTER — Other Ambulatory Visit (HOSPITAL_COMMUNITY): Payer: Self-pay | Admitting: Urology

## 2013-03-29 DIAGNOSIS — N5089 Other specified disorders of the male genital organs: Secondary | ICD-10-CM

## 2013-03-29 DIAGNOSIS — L4050 Arthropathic psoriasis, unspecified: Secondary | ICD-10-CM

## 2013-04-04 ENCOUNTER — Other Ambulatory Visit (HOSPITAL_COMMUNITY): Payer: Self-pay | Admitting: Urology

## 2013-04-04 ENCOUNTER — Ambulatory Visit (HOSPITAL_COMMUNITY)
Admission: RE | Admit: 2013-04-04 | Discharge: 2013-04-04 | Disposition: A | Discharge status: Home / Self Care | Payer: Medicare Other | Source: Ambulatory Visit | Attending: Urology | Admitting: Urology

## 2013-04-04 DIAGNOSIS — N5089 Other specified disorders of the male genital organs: Secondary | ICD-10-CM

## 2013-04-04 DIAGNOSIS — L4050 Arthropathic psoriasis, unspecified: Secondary | ICD-10-CM

## 2013-04-04 DIAGNOSIS — N508 Other specified disorders of male genital organs: Secondary | ICD-10-CM | POA: Insufficient documentation

## 2013-04-04 DIAGNOSIS — N433 Hydrocele, unspecified: Secondary | ICD-10-CM | POA: Insufficient documentation

## 2013-04-04 DIAGNOSIS — R972 Elevated prostate specific antigen [PSA]: Secondary | ICD-10-CM | POA: Insufficient documentation

## 2013-07-11 ENCOUNTER — Encounter (HOSPITAL_COMMUNITY): Payer: Self-pay | Admitting: *Deleted

## 2013-07-27 ENCOUNTER — Other Ambulatory Visit (HOSPITAL_COMMUNITY): Payer: Self-pay | Admitting: Cardiovascular Disease

## 2013-07-27 ENCOUNTER — Ambulatory Visit (HOSPITAL_COMMUNITY)
Admission: RE | Admit: 2013-07-27 | Discharge: 2013-07-27 | Disposition: A | Discharge status: Home / Self Care | Payer: Medicare Other | Source: Ambulatory Visit | Attending: Cardiology | Admitting: Cardiology

## 2013-07-27 DIAGNOSIS — I713 Abdominal aortic aneurysm, ruptured, unspecified: Secondary | ICD-10-CM | POA: Insufficient documentation

## 2013-07-27 DIAGNOSIS — I714 Abdominal aortic aneurysm, without rupture: Secondary | ICD-10-CM

## 2013-07-27 NOTE — Progress Notes (Signed)
Aorta Duplex Completed. [name], BS, RDMS, RVT  

## 2013-08-08 NOTE — Progress Notes (Signed)
Quick Note:  Called and spoke to patient. Gave abdominal duplex results. ______

## 2013-08-10 ENCOUNTER — Encounter: Payer: Self-pay | Admitting: *Deleted

## 2013-08-12 ENCOUNTER — Ambulatory Visit (INDEPENDENT_AMBULATORY_CARE_PROVIDER_SITE_OTHER): Payer: Medicare Other | Admitting: Cardiovascular Disease

## 2013-08-12 ENCOUNTER — Encounter: Payer: Self-pay | Admitting: Cardiovascular Disease

## 2013-08-12 VITALS — BP 168/98 | HR 80 | Ht 70.5 in | Wt 187.9 lb

## 2013-08-12 DIAGNOSIS — I714 Abdominal aortic aneurysm, without rupture, unspecified: Secondary | ICD-10-CM

## 2013-08-12 DIAGNOSIS — I2510 Atherosclerotic heart disease of native coronary artery without angina pectoris: Secondary | ICD-10-CM

## 2013-08-12 DIAGNOSIS — I10 Essential (primary) hypertension: Secondary | ICD-10-CM

## 2013-08-12 DIAGNOSIS — N183 Chronic kidney disease, stage 3 unspecified: Secondary | ICD-10-CM

## 2013-08-12 NOTE — Patient Instructions (Signed)
Dr. Tresa Endo wants you to follow-up in 6 months. You will receive a reminder letter in the mail two months in advance. If you don't receive a letter, please call our office to schedule the follow-up appointment.  Take the Demadex (Torsemide) EVERY THIRD DAY. Take an extra tablet only if needed.  Call back with the name of the antibiotic you are taking.

## 2013-08-18 ENCOUNTER — Encounter: Payer: Self-pay | Admitting: Cardiovascular Disease

## 2013-08-18 DIAGNOSIS — I714 Abdominal aortic aneurysm, without rupture, unspecified: Secondary | ICD-10-CM | POA: Insufficient documentation

## 2013-08-18 NOTE — Progress Notes (Signed)
Patient ID: [name], male   DOB: 07/23/1919, 79 y.o.   MRN: [id number]     HPI: [name] is a 79 y.o. male is for six-month cardiology evaluation.  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 He status post dual-chamber permanent pacemaker in 2010 for sinus node dysfunction. He has a documented abdominal aortic aneurysm and on 07/27/2013 a followup study showed this to be 3.9x4.0 cm with a mild amount of mural thrombus. 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 Is mild dilatation of 1.7 x 2.0 cm involving the left common iliac artery with mild amount of homogeneous plaque.  He is followed by Dr. Caryn Section and the past for his mild renal insufficiency and in October 2013 his creatinine was 2.3. He last saw Dr. court-ordered for pacemaker evaluation on 02/16/2013.  [name] does note occasional leg swelling bilaterally. He denies recent chest pressure. He recently had to have his left elbow bursa aspirated on 3 occasions and was apparently recently started on antibiotics empirically. He has a prescription for tri-torsemide but has only been taking this approximately one time per week.  His last echo Doppler study in October 2012 showed an ejection fraction of 65-70%. Mild aortic stenosis. He denies chest pain. He denies presyncope or syncope. He denies palpitations.  Past Medical History  Diagnosis Date  . Coronary artery disease   . Hypertension   . Pacemaker 05/08/2009    Medtronic  . CKD (chronic kidney disease), stage III   . Complete heart block   . GERD (gastroesophageal reflux disease)   . Diastolic heart failure   . Myocardial infarction 2010  . Stroke 1998, 2005, 2009  . Shingles   . CHF (congestive heart failure)     diastolic  . COPD (chronic obstructive  pulmonary disease)   . Arthritis   . Left-sided weakness   . Peripheral edema   . AAA (abdominal aortic aneurysm)     infrarenal 03/12/12- 3.7 x 3.5 cm    Past Surgical History  Procedure Laterality Date  . Eye surgery      tear duct probing  . Permanent pacemaker insertion  05/08/2009    Medtronic  . Hernia repair  10 yrs ago    bilateral_[facility]  . Cataract extraction w/phaco  02/03/2012    Procedure: CATARACT EXTRACTION PHACO AND INTRAOCULAR LENS PLACEMENT (IOC);  Surgeon: Loraine Leriche T. Nile Pankratz, MD;  Location: [facility];  Service: Ophthalmology;  Laterality: Left;  CDE=29.12  . Cataract extraction w/phaco  03/23/2012    Procedure: CATARACT EXTRACTION PHACO AND INTRAOCULAR LENS PLACEMENT (IOC);  Surgeon: Loraine Leriche T. Nile Gaona, MD;  Location: [facility];  Service: Ophthalmology;  Laterality: Right;  CDE 22.98  . Nm myocar perf wall motion  06/13/2009    mild to mod ischemia basal inferior & mid inferior regions    Allergies  Allergen Reactions  . Bystolic [Nebivolol Hcl]   . Lasix [Furosemide] Itching    Scalp broke out  . Plavix [Clopidogrel Bisulfate]     Current Outpatient Prescriptions  Medication Sig Dispense Refill  . aspirin EC 81 MG tablet Take 81 mg by mouth daily.      . Cholecalciferol (VITAMIN D) 2000 UNITS tablet Take 2,000 Units by mouth daily.      Marland Kitchen diltiazem (TIAZAC) 180  MG 24 hr capsule Take 180 mg by mouth daily.      Marland Kitchen doxazosin (CARDURA) 1 MG tablet Take 1 mg by mouth at bedtime.      . ferrous sulfate 325 (65 FE) MG tablet Take 325 mg by mouth daily.      . fluticasone (FLONASE) 50 MCG/ACT nasal spray Place 2 sprays into the nose daily as needed. For allergies      . ibuprofen (ADVIL,MOTRIN) 200 MG tablet Take 400 mg by mouth every 6 (six) hours as needed. For pain      . metoprolol (LOPRESSOR) 50 MG tablet Take 50 mg by mouth 2 (two) times daily.      Marland Kitchen omeprazole (PRILOSEC) 40 MG capsule Take 40 mg by mouth daily.      Marland Kitchen OVER THE COUNTER MEDICATION Take 2 capsules by  mouth daily. Immutol (gets medication from [facility] in [place])      . polyethylene glycol (MIRALAX / GLYCOLAX) packet Take 17 g by mouth every other day.      . potassium chloride (KLOR-CON) 10 MEQ CR tablet Take 10 mEq by mouth daily.        . Saw Palmetto 450 MG CAPS Take 1 capsule by mouth daily.      Marland Kitchen sulfamethoxazole-trimethoprim (BACTRIM DS,SEPTRA DS) 800-160 MG per tablet Take 1 tablet by mouth 2 (two) times daily.      Marland Kitchen torsemide (DEMADEX) 20 MG tablet Take 1 tablet (20 mg total) by mouth daily as needed (For leg swelling.).  30 tablet  0  . vitamin B-12 (CYANOCOBALAMIN) 500 MCG tablet Take 500 mcg by mouth daily.       No current facility-administered medications for this visit.    History   Social History  . Marital Status: Married    Spouse Name: N/A    Number of Children: N/A  . Years of Education: N/A   Occupational History  . Not on file.   Social History Main Topics  . Smoking status: Never Smoker   . Smokeless tobacco: Not on file  . Alcohol Use: Yes     Comment: once a week  . Drug Use: No  . Sexual Activity: Yes    Birth Control/ Protection: None   Other Topics Concern  . Not on file   Social History Narrative  . No narrative on file    Family History  Problem Relation Age of Onset  . Anesthesia problems Neg Hx   . Hypotension Neg Hx   . Malignant hyperthermia Neg Hx   . Pseudochol deficiency Neg Hx    Socially he is married and has 3 children.  ROS is negative for fevers, chills or night sweats. He denies visual changes. He denies rash. He recently had left elbow bursa aspirated. There is no presyncope or syncope. He is unaware of palpitations. He denies chest pressure. He denies change in bowel or bladder habits. He denies the blood in the stool or urine. He does admit to leg swelling bilaterally particularly about the sock line appear he denies claudication.  Other comprehensive 12 point system review is negative.  PE BP 168/98  Pulse  80  Ht 5' 10.5" (1.791 m)  Wt 187 lb 14.4 oz (85.231 kg)  BMI 26.57 kg/m2  General: Alert, oriented, no distress.  Skin: normal turgor, no rashes HEENT: Normocephalic, atraumatic. Pupils round and reactive; sclera anicteric;no lid lag.  Nose without nasal septal hypertrophy Mouth/Parynx benign; Mallinpatti scale 3 Neck: No JVD, no carotid  briuts Lungs: clear to ausculatation and percussion; no wheezing or rales Heart: RRR, s1 s2 normal 2/6 systolic murmur in aortic area. Abdomen: soft, nontender; no hepatosplenomehaly, BS+; abdominal aorta nontender and not dilated by palpation. Pulses 2+ Extremities: One to 2+ pedal edema above the sock line; no clubbing cyanosis, Homan's sign negative  Neurologic: grossly nonfocal Psychologic: normal affect and mood.  ECG: A dual paced rhythm. Ventricular rate 80 beats per minute.  LABS:  BMET    Component Value Date/Time   NA 136 03/16/2012 1350   K 4.9 03/16/2012 1350   CL 100 03/16/2012 1350   CO2 26 03/16/2012 1350   GLUCOSE 106* 03/16/2012 1350   BUN 45* 03/16/2012 1350   CREATININE 2.56* 03/16/2012 1350   CALCIUM 9.5 03/16/2012 1350   GFRNONAA 20* 03/16/2012 1350   GFRAA 24* 03/16/2012 1350     Hepatic Function Panel     Component Value Date/Time   PROT 6.7 08/03/2011 2032   ALBUMIN 3.6 08/03/2011 2032   AST 16 08/03/2011 2032   ALT 10 08/03/2011 2032   ALKPHOS 83 08/03/2011 2032   BILITOT 0.4 08/03/2011 2032     CBC    Component Value Date/Time   WBC 6.0 11/10/2011 0817   RBC 4.03* 11/10/2011 0817   HGB 11.7* 03/16/2012 1350   HCT 35.3* 03/16/2012 1350   PLT 175 11/10/2011 0817   MCV 92.8 11/10/2011 0817   MCH 31.0 11/10/2011 0817   MCHC 33.4 11/10/2011 0817   RDW 13.1 11/10/2011 0817   LYMPHSABS 1.5 11/10/2011 0817   MONOABS 0.7 11/10/2011 0817   EOSABS 0.3 11/10/2011 0817   BASOSABS 0.0 11/10/2011 0817     BNP    Component Value Date/Time   PROBNP 2241.0* 11/11/2011 0509    Lipid Panel     Component Value Date/Time   CHOL  214* 08/04/2011 0618   TRIG 159* 08/04/2011 0618   HDL 35* 08/04/2011 0618   CHOLHDL 6.1 08/04/2011 0618   VLDL 32 08/04/2011 0618   LDLCALC 147* 08/04/2011 0618     RADIOLOGY: No results found.    ASSESSMENT AND PLAN: Mr. Buxton is now 79 years old. He is status post permanent pacemaker for sinus node dysfunction. He does have a history of hypertension, renal insufficiency, and lower extremity edema. I suggested he try increasing the frequency of taking his torsemide from once a week approximately every third day depending upon leg swelling. He does have mild aortic stenosis noted at his last echo. His blood pressure today is mildly elevated and this should improve with increased torsemide. He is not having any chest pain presyncope syncope or overt heart failure presently. I reviewed his abdominal aorta ultrasound in detail. We will continue to monitor this aneurysm. I will see him in 6 months for followup evaluation.     Lennette Bihari, MD, [facility]  08/18/2013 5:49 PM

## 2013-08-19 ENCOUNTER — Encounter: Payer: Self-pay | Admitting: Cardiovascular Disease

## 2013-10-05 ENCOUNTER — Inpatient Hospital Stay (HOSPITAL_COMMUNITY)
Admission: EM | Admit: 2013-10-05 | Discharge: 2013-10-07 | DRG: 065 | Disposition: A | Discharge status: Home Health | Payer: Medicare Other | Attending: Internal Medicine | Admitting: Internal Medicine

## 2013-10-05 ENCOUNTER — Encounter (HOSPITAL_COMMUNITY): Payer: Self-pay | Admitting: Emergency Medicine

## 2013-10-05 ENCOUNTER — Inpatient Hospital Stay (HOSPITAL_COMMUNITY): Payer: Medicare Other

## 2013-10-05 ENCOUNTER — Emergency Department (HOSPITAL_COMMUNITY): Payer: Medicare Other

## 2013-10-05 DIAGNOSIS — I639 Cerebral infarction, unspecified: Secondary | ICD-10-CM

## 2013-10-05 DIAGNOSIS — I1310 Hypertensive heart and chronic kidney disease without heart failure, with stage 1 through stage 4 chronic kidney disease, or unspecified chronic kidney disease: Secondary | ICD-10-CM

## 2013-10-05 DIAGNOSIS — K219 Gastro-esophageal reflux disease without esophagitis: Secondary | ICD-10-CM | POA: Diagnosis present

## 2013-10-05 DIAGNOSIS — I6350 Cerebral infarction due to unspecified occlusion or stenosis of unspecified cerebral artery: Principal | ICD-10-CM | POA: Diagnosis present

## 2013-10-05 DIAGNOSIS — R5383 Other fatigue: Secondary | ICD-10-CM

## 2013-10-05 DIAGNOSIS — E860 Dehydration: Secondary | ICD-10-CM

## 2013-10-05 DIAGNOSIS — I252 Old myocardial infarction: Secondary | ICD-10-CM

## 2013-10-05 DIAGNOSIS — I130 Hypertensive heart and chronic kidney disease with heart failure and stage 1 through stage 4 chronic kidney disease, or unspecified chronic kidney disease: Secondary | ICD-10-CM | POA: Diagnosis present

## 2013-10-05 DIAGNOSIS — N179 Acute kidney failure, unspecified: Secondary | ICD-10-CM

## 2013-10-05 DIAGNOSIS — Z950 Presence of cardiac pacemaker: Secondary | ICD-10-CM

## 2013-10-05 DIAGNOSIS — I5032 Chronic diastolic (congestive) heart failure: Secondary | ICD-10-CM

## 2013-10-05 DIAGNOSIS — R531 Weakness: Secondary | ICD-10-CM

## 2013-10-05 DIAGNOSIS — R42 Dizziness and giddiness: Secondary | ICD-10-CM

## 2013-10-05 DIAGNOSIS — I714 Abdominal aortic aneurysm, without rupture: Secondary | ICD-10-CM

## 2013-10-05 DIAGNOSIS — E875 Hyperkalemia: Secondary | ICD-10-CM | POA: Diagnosis present

## 2013-10-05 DIAGNOSIS — I2510 Atherosclerotic heart disease of native coronary artery without angina pectoris: Secondary | ICD-10-CM

## 2013-10-05 DIAGNOSIS — N184 Chronic kidney disease, stage 4 (severe): Secondary | ICD-10-CM

## 2013-10-05 DIAGNOSIS — I509 Heart failure, unspecified: Secondary | ICD-10-CM | POA: Diagnosis present

## 2013-10-05 DIAGNOSIS — Z8673 Personal history of transient ischemic attack (TIA), and cerebral infarction without residual deficits: Secondary | ICD-10-CM

## 2013-10-05 DIAGNOSIS — I6529 Occlusion and stenosis of unspecified carotid artery: Secondary | ICD-10-CM | POA: Diagnosis present

## 2013-10-05 DIAGNOSIS — R29810 Facial weakness: Secondary | ICD-10-CM

## 2013-10-05 DIAGNOSIS — I10 Essential (primary) hypertension: Secondary | ICD-10-CM

## 2013-10-05 DIAGNOSIS — Z7982 Long term (current) use of aspirin: Secondary | ICD-10-CM

## 2013-10-05 DIAGNOSIS — R29898 Other symptoms and signs involving the musculoskeletal system: Secondary | ICD-10-CM

## 2013-10-05 DIAGNOSIS — I495 Sick sinus syndrome: Secondary | ICD-10-CM | POA: Diagnosis present

## 2013-10-05 DIAGNOSIS — E785 Hyperlipidemia, unspecified: Secondary | ICD-10-CM | POA: Diagnosis present

## 2013-10-05 LAB — BASIC METABOLIC PANEL
CO2: 26 mEq/L (ref 19–32)
Chloride: 105 mEq/L (ref 96–112)
GFR calc non Af Amer: 24 mL/min — ABNORMAL LOW (ref >90)
Glucose, Bld: 121 mg/dL — ABNORMAL HIGH (ref 70–99)
Potassium: 5.2 mEq/L — ABNORMAL HIGH (ref 3.5–5.1)
Sodium: 139 mEq/L (ref 135–145)

## 2013-10-05 LAB — URINALYSIS W MICROSCOPIC + REFLEX CULTURE
Glucose, UA: NEGATIVE mg/dL
Hgb urine dipstick: NEGATIVE
Ketones, ur: NEGATIVE mg/dL
Protein, ur: 100 mg/dL — AB
Specific Gravity, Urine: 1.025 (ref 1.005–1.030)
Urobilinogen, UA: 0.2 mg/dL (ref 0.0–1.0)

## 2013-10-05 LAB — CBC WITH DIFFERENTIAL/PLATELET
Eosinophils Absolute: 0.1 10*3/uL (ref 0.0–0.7)
HCT: 35 % — ABNORMAL LOW (ref 39.0–52.0)
Hemoglobin: 11.3 g/dL — ABNORMAL LOW (ref 13.0–17.0)
Lymphocytes Relative: 19 % (ref 12–46)
Lymphs Abs: 1 10*3/uL (ref 0.7–4.0)
MCHC: 32.3 g/dL (ref 30.0–36.0)
Monocytes Relative: 7 % (ref 3–12)
Neutro Abs: 3.9 10*3/uL (ref 1.7–7.7)
Neutrophils Relative %: 72 % (ref 43–77)
Platelets: 159 10*3/uL (ref 150–400)
RBC: 3.7 MIL/uL — ABNORMAL LOW (ref 4.22–5.81)
WBC: 5.4 10*3/uL (ref 4.0–10.5)

## 2013-10-05 LAB — GLUCOSE, CAPILLARY: Glucose-Capillary: 116 mg/dL — ABNORMAL HIGH (ref 70–99)

## 2013-10-05 LAB — TROPONIN I
Troponin I: <0.3 ng/mL (ref <0.30)
Troponin I: <0.3 ng/mL (ref <0.30)

## 2013-10-05 LAB — URINALYSIS, ROUTINE W REFLEX MICROSCOPIC
Glucose, UA: NEGATIVE mg/dL
Hgb urine dipstick: NEGATIVE
Nitrite: NEGATIVE
Specific Gravity, Urine: 1.025 (ref 1.005–1.030)
Urobilinogen, UA: 0.2 mg/dL (ref 0.0–1.0)
pH: 7 (ref 5.0–8.0)

## 2013-10-05 LAB — URINE MICROSCOPIC-ADD ON

## 2013-10-05 MED ORDER — STROKE: EARLY STAGES OF RECOVERY BOOK
Freq: Once | Status: AC
  Administered 2013-10-06: 10:00:00
  Filled 2013-10-05: qty 1

## 2013-10-05 MED ORDER — FERROUS SULFATE 325 (65 FE) MG PO TABS
325.0000 mg | ORAL_TABLET | Freq: Every day | ORAL | Status: DC
  Administered 2013-10-06 – 2013-10-07 (×2): 325 mg via ORAL
  Filled 2013-10-05 (×2): qty 1

## 2013-10-05 MED ORDER — ASPIRIN 325 MG PO TABS
325.0000 mg | ORAL_TABLET | Freq: Once | ORAL | Status: AC
  Administered 2013-10-05: 325 mg via ORAL
  Filled 2013-10-05: qty 1

## 2013-10-05 MED ORDER — METOPROLOL TARTRATE 50 MG PO TABS
50.0000 mg | ORAL_TABLET | Freq: Two times a day (BID) | ORAL | Status: DC
  Administered 2013-10-06 – 2013-10-07 (×3): 50 mg via ORAL
  Filled 2013-10-05 (×3): qty 1

## 2013-10-05 MED ORDER — ENOXAPARIN SODIUM 30 MG/0.3ML ~~LOC~~ SOLN
30.0000 mg | SUBCUTANEOUS | Status: DC
  Administered 2013-10-05 – 2013-10-06 (×2): 30 mg via SUBCUTANEOUS
  Filled 2013-10-05 (×2): qty 0.3

## 2013-10-05 MED ORDER — SODIUM POLYSTYRENE SULFONATE 15 GM/60ML PO SUSP
15.0000 g | Freq: Once | ORAL | Status: AC
  Administered 2013-10-05: 15 g via ORAL
  Filled 2013-10-05: qty 60

## 2013-10-05 MED ORDER — HYDRALAZINE HCL 20 MG/ML IJ SOLN
10.0000 mg | Freq: Four times a day (QID) | INTRAMUSCULAR | Status: DC | PRN
  Administered 2013-10-06: 10 mg via INTRAVENOUS
  Filled 2013-10-05 (×2): qty 1

## 2013-10-05 MED ORDER — DILTIAZEM HCL ER BEADS 180 MG PO CP24: 180.0000 mg | ORAL_CAPSULE | Freq: Every day | ORAL | Status: DC

## 2013-10-05 MED ORDER — DOXAZOSIN MESYLATE 2 MG PO TABS
1.0000 mg | ORAL_TABLET | Freq: Every day | ORAL | Status: DC
  Administered 2013-10-06: 1 mg via ORAL
  Filled 2013-10-05: qty 1

## 2013-10-05 MED ORDER — VITAMIN B-12 1000 MCG PO TABS
500.0000 ug | ORAL_TABLET | Freq: Every day | ORAL | Status: DC
  Administered 2013-10-06 – 2013-10-07 (×2): 500 ug via ORAL
  Filled 2013-10-05 (×2): qty 1

## 2013-10-05 MED ORDER — DILTIAZEM HCL ER COATED BEADS 180 MG PO CP24
180.0000 mg | ORAL_CAPSULE | Freq: Every day | ORAL | Status: DC
  Administered 2013-10-06 – 2013-10-07 (×2): 180 mg via ORAL
  Filled 2013-10-05 (×2): qty 1

## 2013-10-05 MED ORDER — PANTOPRAZOLE SODIUM 40 MG PO TBEC
80.0000 mg | DELAYED_RELEASE_TABLET | Freq: Every day | ORAL | Status: DC
  Administered 2013-10-06 – 2013-10-07 (×2): 80 mg via ORAL
  Filled 2013-10-05 (×2): qty 2

## 2013-10-05 MED ORDER — VITAMIN D 1000 UNITS PO TABS
2000.0000 [IU] | ORAL_TABLET | Freq: Every day | ORAL | Status: DC
  Administered 2013-10-06 – 2013-10-07 (×2): 2000 [IU] via ORAL
  Filled 2013-10-05 (×2): qty 2

## 2013-10-05 MED ORDER — ASPIRIN 325 MG PO TABS
325.0000 mg | ORAL_TABLET | Freq: Every day | ORAL | Status: DC
  Administered 2013-10-06: 325 mg via ORAL
  Filled 2013-10-05: qty 1

## 2013-10-05 NOTE — ED Notes (Signed)
Dr. Patria Mane aware of pt and CT head ordered.

## 2013-10-05 NOTE — ED Provider Notes (Signed)
CSN: [id number]     Arrival date & time 10/05/13  1154 History  This chart was scribed for Dagmar Hait, MD by Quintella Reichert, ED scribe.  This patient was seen in room [facility] and the patient's care was started at 1:22 PM.   Chief Complaint  Patient presents with  . Dizziness  . Weakness    Patient is a 79 y.o. male presenting with weakness. The history is provided by the patient and a relative. No language interpreter was used.  Weakness This is a new problem. The current episode started 6 to 12 hours ago. The problem occurs constantly. The problem has been gradually improving. Associated symptoms include headaches. Pertinent negatives include no chest pain, no abdominal pain and no shortness of breath. Nothing aggravates the symptoms. Nothing relieves the symptoms.    HPI Comments: DESHAUN WEISINGER is a 79 y.o. male with h/o stroke, MI, CHF, and COPD who presents to the Emergency Department complaining of dizziness that began this morning.  Pt reports that he is not usually dizzy and he was in his usual state of health when he went to bed last night.  This morning he woke up at 5:30 AM and used the restroom and when he sat back down on the side of his bed he "noticed that I had been dizzy" and was "leaning to the left."  Wife states that he sat down on the bed and "just slid down" onto the floor.  Wife checked his BP and found that it was 207/101.  She states it is usually around 135/70.  She gave him his BP medication and an aspirin 81mg  tablet and called EMS.  When they arrived BP was still elevated at 200/110.  Pt states his dizziness is "mostly gone" currently.  He reports he also had a headache which has since resolved.  He denies CP, SOB, nausea, vomiting, cough, or fever.  Pt states his symptoms today did not feel similar to previous strokes.  Wife states that these symptoms are not typical for patient at his baseline.  Pt denies prior h/o dizziness related to his heart.  He  does not use oxygen at home.  He was placed on O2 by EMS.  He has had a pacemaker in place since 2010 and it was checked last month.     Past Medical History  Diagnosis Date  . Coronary artery disease   . Hypertension   . Pacemaker 05/08/2009    [employer]  . CKD (chronic kidney disease), stage III   . Complete heart block   . GERD (gastroesophageal reflux disease)   . Diastolic heart failure   . Myocardial infarction 2010  . Stroke 1998, 2005, 2009  . Shingles   . CHF (congestive heart failure)     diastolic  . COPD (chronic obstructive pulmonary disease)   . Arthritis   . Left-sided weakness   . Peripheral edema   . AAA (abdominal aortic aneurysm)     infrarenal 03/12/12- 3.7 x 3.5 cm    Past Surgical History  Procedure Laterality Date  . Eye surgery      tear duct probing  . Permanent pacemaker insertion  05/08/2009    [employer]  . Hernia repair  10 yrs ago    [facility]  . Cataract extraction w/phaco  02/03/2012    Procedure: CATARACT EXTRACTION PHACO AND INTRAOCULAR LENS PLACEMENT (IOC);  Surgeon: Loraine Leriche T. Nile Mcfaul, MD;  Location: [facility];  Service: Ophthalmology;  Laterality: Left;  CDE=29.12  .  Cataract extraction w/phaco  03/23/2012    Procedure: CATARACT EXTRACTION PHACO AND INTRAOCULAR LENS PLACEMENT (IOC);  Surgeon: Loraine Leriche T. Nile Zea, MD;  Location: [facility];  Service: Ophthalmology;  Laterality: Right;  CDE 22.98  . Nm myocar perf wall motion  06/13/2009    mild to mod ischemia basal inferior & mid inferior regions    Family History  Problem Relation Age of Onset  . Anesthesia problems Neg Hx   . Hypotension Neg Hx   . Malignant hyperthermia Neg Hx   . Pseudochol deficiency Neg Hx     History  Substance Use Topics  . Smoking status: Never Smoker   . Smokeless tobacco: Not on file  . Alcohol Use: Yes     Comment: once a week     Review of Systems  Constitutional: Negative for fever.  Respiratory: Negative for cough and shortness of breath.    Cardiovascular: Negative for chest pain.  Gastrointestinal: Negative for nausea, vomiting and abdominal pain.  Neurological: Positive for dizziness, weakness and headaches.  All other systems reviewed and are negative.     Allergies  Lasix; Bystolic; and Plavix  Home Medications   Current Outpatient Rx  Name  Route  Sig  Dispense  Refill  . aspirin EC 81 MG tablet   Oral   Take 81 mg by mouth daily.         . Cholecalciferol (VITAMIN D) 2000 UNITS tablet   Oral   Take 2,000 Units by mouth daily.         Marland Kitchen diltiazem (TIAZAC) 180 MG 24 hr capsule   Oral   Take 180 mg by mouth daily.         Marland Kitchen doxazosin (CARDURA) 1 MG tablet   Oral   Take 1 mg by mouth at bedtime.         . ferrous sulfate 325 (65 FE) MG tablet   Oral   Take 325 mg by mouth daily.         . fluticasone (FLONASE) 50 MCG/ACT nasal spray   Nasal   Place 2 sprays into the nose daily as needed. For allergies         . ibuprofen (ADVIL,MOTRIN) 200 MG tablet   Oral   Take 400 mg by mouth every 6 (six) hours as needed. For pain         . metoprolol (LOPRESSOR) 50 MG tablet   Oral   Take 50 mg by mouth 2 (two) times daily.         Marland Kitchen omeprazole (PRILOSEC) 40 MG capsule   Oral   Take 40 mg by mouth daily.         Marland Kitchen OVER THE COUNTER MEDICATION   Oral   Take 2 capsules by mouth daily. Immutol (gets medication from [facility] in [place])         . polyethylene glycol (MIRALAX / GLYCOLAX) packet   Oral   Take 17 g by mouth every other day.         . potassium chloride (KLOR-CON) 10 MEQ CR tablet   Oral   Take 10 mEq by mouth daily.           . Saw Palmetto 450 MG CAPS   Oral   Take 1 capsule by mouth daily.         Marland Kitchen sulfamethoxazole-trimethoprim (BACTRIM DS,SEPTRA DS) 800-160 MG per tablet   Oral   Take 1 tablet by mouth 2 (two) times daily.         Marland Kitchen  torsemide (DEMADEX) 20 MG tablet   Oral   Take 1 tablet (20 mg total) by mouth daily as needed (For leg  swelling.).   30 tablet   0   . vitamin B-12 (CYANOCOBALAMIN) 500 MCG tablet   Oral   Take 500 mcg by mouth daily.          BP 154/100  Pulse 68  Temp(Src) 97.6 F (36.4 C) (Oral)  Resp 18  SpO2 94%  Physical Exam  Nursing note and vitals reviewed. Constitutional: He is oriented to person, place, and time. He appears well-developed and well-nourished. No distress.  HENT:  Head: Normocephalic and atraumatic.  Eyes: EOM are normal.  Neck: Neck supple. No tracheal deviation present.  Cardiovascular: Normal rate, regular rhythm and normal heart sounds.   No murmur heard. Pulmonary/Chest: Effort normal and breath sounds normal. No respiratory distress. He has no wheezes. He has no rales.  Musculoskeletal: Normal range of motion.  Neurological: He is alert and oriented to person, place, and time. No cranial nerve deficit. He exhibits abnormal muscle tone (mild weakness in L arm, similar to old stroke).  Skin: Skin is warm and dry.  Psychiatric: He has a normal mood and affect. His behavior is normal.    ED Course  Procedures (including critical care time)  DIAGNOSTIC STUDIES: Oxygen Saturation is 94% on room air, adequate by my interpretation.    COORDINATION OF CARE: 1:34 PM-Discussed treatment plan which includes EKG, labs, and imaging with pt at bedside and pt agreed to plan.    Labs Review Labs Reviewed  CBC WITH DIFFERENTIAL - Abnormal; Notable for the following:    RBC 3.70 (*)    Hemoglobin 11.3 (*)    HCT 35.0 (*)    All other components within normal limits  GLUCOSE, CAPILLARY - Abnormal; Notable for the following:    Glucose-Capillary 116 (*)    All other components within normal limits  BASIC METABOLIC PANEL  URINALYSIS, ROUTINE W REFLEX MICROSCOPIC  TROPONIN I    Imaging Review Ct Head Wo Contrast  10/05/2013   CLINICAL DATA:  Dizziness, weakness  EXAM: CT HEAD WITHOUT CONTRAST  TECHNIQUE: Contiguous axial images were obtained from the base of the  skull through the vertex without intravenous contrast.  COMPARISON:  08/03/2011  FINDINGS: No skull fracture is noted. No intracranial hemorrhage, mass effect or midline shift. There is mucosal thickening left maxillary sinus. The mastoid air cells are unremarkable. Stable cerebral atrophy. Periventricular and patchy subcortical white matter decreased attenuation consistent with chronic small vessel ischemic changes. Again noted encephalomalacia due to old infarcts bilateral frontal lobe, right parietal lobe. There is interval lacunar infarct in right basal ganglia of indeterminate age. No acute cortical infarction. No mass lesion is noted on this unenhanced scan.  IMPRESSION: No acute intracranial abnormality. Stable atrophy and chronic white matter disease.Again noted encephalomalacia due to old infarcts bilateral frontal lobe, right parietal lobe. There is interval lacunar infarct in right basal ganglia of indeterminate age. No acute cortical infarction.   Electronically Signed   By: Natasha Mead M.D.   On: 10/05/2013 12:59    EKG Interpretation    Date/Time:  Wednesday October 05 2013 12:09:03 EST Ventricular Rate:  67 PR Interval:  154 QRS Duration: 192 QT Interval:  504 QTC Calculation: 532 R Axis:   -75 Text Interpretation:  AV dual-paced rhythm rate 67 Confirmed by CAMPOS  MD, KEVIN ([id number]) on 10/05/2013 1:35:02 PM  MDM   1. Dizziness   2. Chronic diastolic CHF (congestive heart failure)   3. CKD (chronic kidney disease) stage 4, GFR 15-29 ml/min   4. Facial droop    54M with hx of multiple strokes, CKD, CHF, complete heart block with pacer presents with dizziness. Began this morning after waking up to go to the bathroom around 5:30. Cannot tell me if this is wooziness vs rotatory dizziness. Patient denies CP, SOB. Had some elevated BP today with EMS. He had a run of Vtach with EMS also per their rhythm strips. No Vtach noted here. AFVSS. Normal cranial nerves, normal  strength and sensation per baseline. Walking easily with his walker.  Labs show elevated BNP. Imaging without acute findings. CT did show new stroke from prior Head CT, but stroke is not acute. Patient could have had TIA with his dizziness now being resolved. Patient admitted to medicine. Neurology consulted and will see patient in the hospital.    I personally performed the services described in this documentation, which was scribed in my presence. The recorded information has been reviewed and is accurate.     Dagmar Hait, MD 10/05/13 [phone]

## 2013-10-05 NOTE — Consult Note (Signed)
[facility] Nila Winker A. Gerilyn Pilgrim, MD     [email or web address]          [name] is an 79 y.o. male.   ASSESSMENT/PLAN:  1. Most likely extension of an old infarct on the right side. I suspect this is a subcortical small lacunar type infarct. The patient will be started on Aggrenox. The dose was started at one a day for 3 days and then increase to twice a day dosing to reduce the risk of headaches which can be severe. He is to continue with aspirin for now.      The patient is a 79 year old white male who presents with the acute onset of headaches, gait instability and left-sided weakness on the day of presentation. The history was initially difficult, some suspected mild baseline cognitive impairment. The patient however reports that he did have a stroke many years ago in 2005. He was left with a mild left hemiparesis but has been to function fairly well using a walker and a mild assistance. The patient called to include the bathroom when he developed left-sided headache and gait instability. He actually fell to the ground on going to the bathroom because of having left-sided weakness. He denies loss of consciousness, seizures, jerking movements, chest pain or shortness of breath. The patient has not returned to baseline. He still reports having left-sided weakness. The patient's workup showed elevated BNP suggesting congestive heart failure. The patient however does not report significant chest pain or dyspnea. The patient CT scan shows subacute infarct but nothing clearly acute. Review of systems is otherwise unremarkable.  GENERAL: Pleasant. No acute distress.   HEENT: Retro-palatal space   ABDOMEN: soft  EXTREMITIES: 1-2+ edema of the ankles. There is some mild erythema noted in the same distribution. There is marked arthritic changes of the knees.  BACK: unremarkable.  SKIN: Normal by inspection.    MENTAL STATUS: Alert and oriented. He is oriented to person place  and year. He also oriented to overall medical conditions. Speech, language and cognition are generally intact. Judgment and insight normal.   CRANIAL NERVES: Pupils are equal, round and reactive to light and accommodation; extra ocular movements are full, there is no significant nystagmus; visual fields are full; upper and lower facial muscles are normal in strength and symmetric, there is no flattening of the nasolabial folds; tongue is midline; uvula is midline; shoulder elevation is normal.  MOTOR: Left deltoid and triceps 4 minus/5. There is significant impairment of the left hand graded as 2-3 with significant impairment of hand dexterity and fine finger movements. Left hip flexion and dorsiflexion are 4 minus. The right side shows normal tone, bulk and strength.  COORDINATION: Left finger to nose is normal, right finger to nose is normal, No rest tremor; no intention tremor; no postural tremor; no bradykinesia.  REFLEXES: Deep tendon reflexes are symmetrical and normal. Plantar responses are extensor on the left and equivocal on the right.   SENSATION: Normal to light touch.   Past Medical History  Diagnosis Date  . Coronary artery disease   . Hypertension   . Pacemaker 05/08/2009    [employer]  . CKD (chronic kidney disease), stage III   . Complete heart block   . GERD (gastroesophageal reflux disease)   . Diastolic heart failure   . Myocardial infarction 2010  . Stroke 1998, 2005, 2009  . Shingles   . CHF (congestive heart failure)     diastolic  . COPD (chronic obstructive pulmonary disease)   .  Arthritis   . Left-sided weakness   . Peripheral edema   . AAA (abdominal aortic aneurysm)     infrarenal 03/12/12- 3.7 x 3.5 cm    Past Surgical History  Procedure Laterality Date  . Eye surgery      tear duct probing  . Permanent pacemaker insertion  05/08/2009    [employer]  . Hernia repair  10 yrs ago    [facility]  . Cataract extraction w/phaco  02/03/2012     Procedure: CATARACT EXTRACTION PHACO AND INTRAOCULAR LENS PLACEMENT (IOC);  Surgeon: Loraine Leriche T. Nile Hebner, MD;  Location: [facility];  Service: Ophthalmology;  Laterality: Left;  CDE=29.12  . Cataract extraction w/phaco  03/23/2012    Procedure: CATARACT EXTRACTION PHACO AND INTRAOCULAR LENS PLACEMENT (IOC);  Surgeon: Loraine Leriche T. Nile Bures, MD;  Location: [facility];  Service: Ophthalmology;  Laterality: Right;  CDE 22.98  . Nm myocar perf wall motion  06/13/2009    mild to mod ischemia basal inferior & mid inferior regions    Family History  Problem Relation Age of Onset  . Anesthesia problems Neg Hx   . Hypotension Neg Hx   . Malignant hyperthermia Neg Hx   . Pseudochol deficiency Neg Hx     Social History:  reports that he has never smoked. He does not have any smokeless tobacco history on file. He reports that he drinks alcohol. He reports that he does not use illicit drugs.  Allergies:  Allergies  Allergen Reactions  . Lasix [Furosemide] Itching    Scalp broke out  . Bystolic [Nebivolol Hcl] Rash  . Plavix [Clopidogrel Bisulfate] Rash    Medications: Prior to Admission medications   Medication Sig Start Date End Date Taking? Authorizing Provider  aspirin EC 81 MG tablet Take 81 mg by mouth daily.   Yes Historical Provider, MD  Cholecalciferol (VITAMIN D) 2000 UNITS tablet Take 2,000 Units by mouth daily.   Yes Historical Provider, MD  diltiazem (TIAZAC) 180 MG 24 hr capsule Take 180 mg by mouth daily.   Yes Historical Provider, MD  doxazosin (CARDURA) 1 MG tablet Take 1 mg by mouth at bedtime.   Yes Historical Provider, MD  ferrous sulfate 325 (65 FE) MG tablet Take 325 mg by mouth daily.   Yes Historical Provider, MD  fluticasone (FLONASE) 50 MCG/ACT nasal spray Place 2 sprays into the nose daily as needed. For allergies 11/13/11  Yes Elliot Cousin, MD  ibuprofen (ADVIL,MOTRIN) 200 MG tablet Take 400 mg by mouth every 6 (six) hours as needed. For pain   Yes Historical Provider, MD  metoprolol  (LOPRESSOR) 50 MG tablet Take 50 mg by mouth 2 (two) times daily. 08/05/11  Yes Christiane Ha, MD  omeprazole (PRILOSEC) 40 MG capsule Take 40 mg by mouth daily.   Yes Historical Provider, MD  Saw Palmetto 450 MG CAPS Take 1 capsule by mouth daily.   Yes Historical Provider, MD  torsemide (DEMADEX) 20 MG tablet Take 20 mg by mouth daily as needed (swelling).   Yes Historical Provider, MD  vitamin B-12 (CYANOCOBALAMIN) 500 MCG tablet Take 500 mcg by mouth daily.   Yes Historical Provider, MD     Scheduled Meds: .  stroke: mapping our early stages of recovery book   Does not apply Once  . [START ON 10/06/2013] aspirin  325 mg Oral Daily  . cholecalciferol  2,000 Units Oral Daily  . [START ON 10/06/2013] diltiazem  180 mg Oral Daily  . [START ON 10/06/2013] doxazosin  1 mg  Oral QHS  . enoxaparin (LOVENOX) injection  30 mg Subcutaneous Q24H  . ferrous sulfate  325 mg Oral Daily  . [START ON 10/06/2013] metoprolol  50 mg Oral BID  . pantoprazole  80 mg Oral Daily  . vitamin B-12  500 mcg Oral Daily   Continuous Infusions:  PRN Meds:.hydrALAZINE    Blood pressure 163/80, pulse 59, temperature 97.3 F (36.3 C), temperature source Oral, resp. rate 18, height 5\' 10"  (1.778 m), weight 84.2 kg (185 lb 10 oz), SpO2 95.00%.   Results for orders placed during the hospital encounter of 10/05/13 (from the past 48 hour(s))  GLUCOSE, CAPILLARY     Status: Abnormal   Collection Time    10/05/13 12:12 PM      Result Value Range   Glucose-Capillary 116 (*) 70 - 99 mg/dL  CBC WITH DIFFERENTIAL     Status: Abnormal   Collection Time    10/05/13 12:25 PM      Result Value Range   WBC 5.4  4.0 - 10.5 K/uL   RBC 3.70 (*) 4.22 - 5.81 MIL/uL   Hemoglobin 11.3 (*) 13.0 - 17.0 g/dL   HCT 16.1 (*) 09.6 - 04.5 %   MCV 94.6  78.0 - 100.0 fL   MCH 30.5  26.0 - 34.0 pg   MCHC 32.3  30.0 - 36.0 g/dL   RDW 40.9  81.1 - 91.4 %   Platelets 159  150 - 400 K/uL   Neutrophils Relative % 72  43 - 77 %    Neutro Abs 3.9  1.7 - 7.7 K/uL   Lymphocytes Relative 19  12 - 46 %   Lymphs Abs 1.0  0.7 - 4.0 K/uL   Monocytes Relative 7  3 - 12 %   Monocytes Absolute 0.4  0.1 - 1.0 K/uL   Eosinophils Relative 2  0 - 5 %   Eosinophils Absolute 0.1  0.0 - 0.7 K/uL   Basophils Relative 1  0 - 1 %   Basophils Absolute 0.0  0.0 - 0.1 K/uL  BASIC METABOLIC PANEL     Status: Abnormal   Collection Time    10/05/13 12:25 PM      Result Value Range   Sodium 139  135 - 145 mEq/L   Potassium 5.2 (*) 3.5 - 5.1 mEq/L   Chloride 105  96 - 112 mEq/L   CO2 26  19 - 32 mEq/L   Glucose, Bld 121 (*) 70 - 99 mg/dL   BUN 35 (*) 6 - 23 mg/dL   Creatinine, Ser 7.82 (*) 0.50 - 1.35 mg/dL   Calcium 9.3  8.4 - 95.6 mg/dL   GFR calc non Af Amer 24 (*) >90 mL/min   GFR calc Af Amer 28 (*) >90 mL/min   Comment: (NOTE)     The eGFR has been calculated using the CKD EPI equation.     This calculation has not been validated in all clinical situations.     eGFR's persistently <90 mL/min signify possible Chronic Kidney     Disease.  TROPONIN I     Status: None   Collection Time    10/05/13 12:25 PM      Result Value Range   Troponin I <0.30  <0.30 ng/mL   Comment:            Due to the release kinetics of cTnI,     a negative result within the first hours     of the onset of  symptoms does not rule out     myocardial infarction with certainty.     If myocardial infarction is still suspected,     repeat the test at appropriate intervals.  PRO B NATRIURETIC PEPTIDE     Status: Abnormal   Collection Time    10/05/13  1:44 PM      Result Value Range   Pro B Natriuretic peptide (BNP) 3785.0 (*) 0 - 450 pg/mL  URINALYSIS, ROUTINE W REFLEX MICROSCOPIC     Status: Abnormal   Collection Time    10/05/13  2:17 PM      Result Value Range   Color, Urine YELLOW  YELLOW   APPearance CLEAR  CLEAR   Specific Gravity, Urine 1.025  1.005 - 1.030   pH 7.0  5.0 - 8.0   Glucose, UA NEGATIVE  NEGATIVE mg/dL   Hgb urine dipstick  NEGATIVE  NEGATIVE   Bilirubin Urine NEGATIVE  NEGATIVE   Ketones, ur NEGATIVE  NEGATIVE mg/dL   Protein, ur 119 (*) NEGATIVE mg/dL   Urobilinogen, UA 0.2  0.0 - 1.0 mg/dL   Nitrite NEGATIVE  NEGATIVE   Leukocytes, UA NEGATIVE  NEGATIVE  URINE MICROSCOPIC-ADD ON     Status: None   Collection Time    10/05/13  2:17 PM      Result Value Range   Squamous Epithelial / LPF RARE  RARE   WBC, UA 0-2  <3 WBC/hpf   Bacteria, UA RARE  RARE  URINALYSIS W MICROSCOPIC + REFLEX CULTURE     Status: Abnormal   Collection Time    10/05/13  6:23 PM      Result Value Range   Color, Urine YELLOW  YELLOW   APPearance CLEAR  CLEAR   Specific Gravity, Urine 1.025  1.005 - 1.030   pH 6.0  5.0 - 8.0   Glucose, UA NEGATIVE  NEGATIVE mg/dL   Hgb urine dipstick NEGATIVE  NEGATIVE   Bilirubin Urine NEGATIVE  NEGATIVE   Ketones, ur NEGATIVE  NEGATIVE mg/dL   Protein, ur 147 (*) NEGATIVE mg/dL   Urobilinogen, UA 0.2  0.0 - 1.0 mg/dL   Nitrite NEGATIVE  NEGATIVE   Leukocytes, UA NEGATIVE  NEGATIVE   WBC, UA 0-2  <3 WBC/hpf   Squamous Epithelial / LPF RARE  RARE   Casts HYALINE CASTS (*) NEGATIVE  TROPONIN I     Status: None   Collection Time    10/05/13  6:46 PM      Result Value Range   Troponin I <0.30  <0.30 ng/mL   Comment:            Due to the release kinetics of cTnI,     a negative result within the first hours     of the onset of symptoms does not rule out     myocardial infarction with certainty.     If myocardial infarction is still suspected,     repeat the test at appropriate intervals.    Ct Head Wo Contrast  10/05/2013   CLINICAL DATA:  Dizziness, weakness  EXAM: CT HEAD WITHOUT CONTRAST  TECHNIQUE: Contiguous axial images were obtained from the base of the skull through the vertex without intravenous contrast.  COMPARISON:  08/03/2011  FINDINGS: No skull fracture is noted. No intracranial hemorrhage, mass effect or midline shift. There is mucosal thickening left maxillary sinus. The  mastoid air cells are unremarkable. Stable cerebral atrophy. Periventricular and patchy subcortical white matter decreased attenuation consistent with chronic  small vessel ischemic changes. Again noted encephalomalacia due to old infarcts bilateral frontal lobe, right parietal lobe. There is interval lacunar infarct in right basal ganglia of indeterminate age. No acute cortical infarction. No mass lesion is noted on this unenhanced scan.  IMPRESSION: No acute intracranial abnormality. Stable atrophy and chronic white matter disease.Again noted encephalomalacia due to old infarcts bilateral frontal lobe, right parietal lobe. There is interval lacunar infarct in right basal ganglia of indeterminate age. No acute cortical infarction.   Electronically Signed   By: Natasha Mead M.D.   On: 10/05/2013 12:59   US Carotid Duplex Bilateral  10/05/2013   CLINICAL DATA:  Dizziness; evidence of prior CVA  EXAM: BILATERAL CAROTID DUPLEX ULTRASOUND  TECHNIQUE: Wallace Cullens scale imaging, color Doppler and duplex ultrasound were performed of bilateral carotid and vertebral arteries in the neck.  COMPARISON:  August 04, 2011  FINDINGS: Criteria: Quantification of carotid stenosis is based on velocity parameters that correlate the residual internal carotid diameter with NASCET-based stenosis levels, using the diameter of the distal internal carotid lumen as the denominator for stenosis measurement.  The following velocity measurements were obtained:  RIGHT  ICA:  75/20 cm/sec  CCA:  47/8 cm/sec  SYSTOLIC ICA/CCA RATIO:  1.6  DIASTOLIC ICA/CCA RATIO:  2.6  ECA:  112 cm/sec peak systolic  LEFT  ICA:  83/26 cm/sec  CCA:  48/8 cm/sec  SYSTOLIC ICA/CCA RATIO:  1.7  DIASTOLIC ICA/CCA RATIO:  3.2  ECA:  108  cm/sec peak systolic  RIGHT CAROTID ARTERY: There is diffuse intimal thickening. There is irregular heterogeneous echogenicity plaque throughout the bifurcation and proximal right internal carotid artery. There are areas of aliasing with a  color flow suggesting hemodynamically significant obstruction. By real-time interrogation, there appears to be approximately 70% diameter stenosis in the proximal right internal carotid artery.  RIGHT VERTEBRAL ARTERY:  Flow is antegrade.  LEFT CAROTID ARTERY: There is extensive plaque throughout the bifurcation and proximal internal carotid artery. This plaque is heterogeneous in echotexture. Areas of color flow a aliasing are seen in this vessel. There are multiple areas of greater than 50% diameter stenosis in the bifurcation and proximal internal carotid artery. There is an area of that appears to represent approximately 85% diameter stenosis slightly beyond the bifurcation in the proximal left internal carotid artery.  LEFT VERTEBRAL ARTERY:  Nonvisualized.  IMPRESSION: By real-time interrogation, there appears to be hemodynamically significant obstruction in both proximal internal carotid arteries, somewhat more severe on the left than on the right. Color flow Doppler aliasing suggests that there is indeed hemodynamically significant stenosis in these vessels. The peak systolic velocities do not indicate obstruction of this degree; there reason for this discrepancy is frankly uncertain. The  Flow in the right vertebral artery is in the anatomic direction. The left vertebral artery could not be visualized.  Given the discrepancy between real-time interrogation an velocity measurements as well as nonvisualization of the left vertebral artery, correlation with CT or MR angiography of the extracranial carotid and vertebral circulation is felt to be advisable.   Electronically Signed   By: Bretta Bang M.D.   On: 10/05/2013 18:39   Dg Chest Port 1 View  10/05/2013   CLINICAL DATA:  Dizziness and hypertension ; COPD  EXAM: PORTABLE CHEST - 1 VIEW  COMPARISON:  November 11, 2011  FINDINGS: There is underlying emphysematous change. There is mild scarring in the bases. There is no edema or consolidation. Heart  is enlarged but stable. Pulmonary vascularity reflects  underlying emphysema. Aorta is prominent with atherosclerotic change, stable. Pacemaker leads are attached to the right atrium and right ventricle. No pneumothorax. No adenopathy. No bone lesions.  IMPRESSION: Underlying emphysema. No edema or consolidation. Stable cardiomegaly. Stable prominence of the thoracic aorta with atherosclerotic change. Question hypertensive change.   Electronically Signed   By: Bretta Bang M.D.   On: 10/05/2013 14:09        Eufelia Veno A. Gerilyn Pilgrim, M.D.  Diplomate, Biomedical engineer of Psychiatry and Neurology ( Neurology). 10/05/2013, 9:08 PM

## 2013-10-05 NOTE — H&P (Signed)
[facility] History and Physical  [name] ZOX:[id number] DOB: April 25, 1919 DOA: 10/05/2013   PCP: Colette Ribas, MD   Chief Complaint: Lower extremity weakness, dizziness  HPI:  79 year old male with a history of stroke, hypertension, sick sinus syndrome, coronary artery disease, chronic diastolic CHF presented with one-day history of leg weakness, generalized weakness, and dizziness. The patient is a poor historian. Part of this history is obtained from speaking with the patient's wife. Around 6 AM on the morning of admission, the patient was trying to get out of bed and subsequently slid out of bed without any falling or syncope. He was feeling dizzy at the time. He was resistant to coming to the emergency department. He went back to bed and subsequent workup 9 AM in the morning. He continued to describe lower extremity weakness with some leaning to his left side. His wife also noted that he had some left facial drip that was slightly worse than usual. She stated that his speech was normal. There is no visual disturbance, or syncope. His blood pressure was checked at home with systolic PA pressure in the 200s. Because of his lower extremities weakness, his emergency department. He denies any chest discomfort, shortness breath, nausea, vomiting, fevers, chills, dysuria, hematuria, abdominal pain.  And 80, CT of the brain did not show any acute stroke but showed an indeterminate age right basal ganglia stroke. Chest x-ray was negative. Her BNP was 3785. Initial troponin was negative. Potassium was 5.2. Serum creatinine 2.2. CBC was unremarkable. Assessment/Plan: Left facial droop with leg weakness -Concerned about acute ischemic stroke -Unfortunately, MRI cannot be obtained due to the patient's pacemaker -Consult neurology -Will allow for permissive hypertension and first 24 hours -Hydralazine 10 mg IV prn SBP >200 -Will restart antihypertensive regimen on the morning of  10/06/2013 -Echocardiogram, carotid duplex -Hemoglobin A1c, lipid panel -Swallow evaluation, PT/OT -Unfortunately, Plavix cannot be used due to the patient's listed allergy causing a rash -Continue aspirin 325 mg daily -UA with reflex to urine culture Hypertension -Restart antihypertensive medications 10/06/2013 -Allow for permissive hypertension in the first 24 hours Hypokalemia -Kayexalate x1 Elevated proBNP -Clinically, patient appears to be compensated -Likely resulting from the patient's CKD Dizziness/generalized weakness -Check orthostatic vital signs -UA with reflex to urine culture -Cycle troponins CKD stage IV - Baseline creatinine 2.2-2.5     Past Medical History  Diagnosis Date  . Coronary artery disease   . Hypertension   . Pacemaker 05/08/2009    [employer]  . CKD (chronic kidney disease), stage III   . Complete heart block   . GERD (gastroesophageal reflux disease)   . Diastolic heart failure   . Myocardial infarction 2010  . Stroke 1998, 2005, 2009  . Shingles   . CHF (congestive heart failure)     diastolic  . COPD (chronic obstructive pulmonary disease)   . Arthritis   . Left-sided weakness   . Peripheral edema   . AAA (abdominal aortic aneurysm)     infrarenal 03/12/12- 3.7 x 3.5 cm   Past Surgical History  Procedure Laterality Date  . Eye surgery      tear duct probing  . Permanent pacemaker insertion  05/08/2009    [employer]tr[employer]  . Hernia repair  10 yrs ago    [facility]  . Cataract extraction w/phaco  02/03/2012    Procedure: CATARACT EXTRACTION PHACO AND INTRAOCULAR LENS PLACEMENT (IOC);  Surgeon: Loraine Leriche T. Nile Kidane, MD;  Location: [facility];  Service: Ophthalmology;  Laterality: Left;  CDE=29.12  .  Cataract extraction w/phaco  03/23/2012    Procedure: CATARACT EXTRACTION PHACO AND INTRAOCULAR LENS PLACEMENT (IOC);  Surgeon: Loraine Leriche T. Nile Pottle, MD;  Location: [facility];  Service: Ophthalmology;  Laterality: Right;  CDE 22.98  . Nm myocar perf wall  motion  06/13/2009    mild to mod ischemia basal inferior & mid inferior regions   Social History:  reports that he has never smoked. He does not have any smokeless tobacco history on file. He reports that he drinks alcohol. He reports that he does not use illicit drugs.   Family History  Problem Relation Age of Onset  . Anesthesia problems Neg Hx   . Hypotension Neg Hx   . Malignant hyperthermia Neg Hx   . Pseudochol deficiency Neg Hx      Allergies  Allergen Reactions  . Lasix [Furosemide] Itching    Scalp broke out  . Bystolic [Nebivolol Hcl] Rash  . Plavix [Clopidogrel Bisulfate] Rash      Prior to Admission medications   Medication Sig Start Date End Date Taking? Authorizing Provider  aspirin EC 81 MG tablet Take 81 mg by mouth daily.   Yes Historical Provider, MD  Cholecalciferol (VITAMIN D) 2000 UNITS tablet Take 2,000 Units by mouth daily.   Yes Historical Provider, MD  diltiazem (TIAZAC) 180 MG 24 hr capsule Take 180 mg by mouth daily.   Yes Historical Provider, MD  doxazosin (CARDURA) 1 MG tablet Take 1 mg by mouth at bedtime.   Yes Historical Provider, MD  ferrous sulfate 325 (65 FE) MG tablet Take 325 mg by mouth daily.   Yes Historical Provider, MD  fluticasone (FLONASE) 50 MCG/ACT nasal spray Place 2 sprays into the nose daily as needed. For allergies 11/13/11  Yes Elliot Cousin, MD  ibuprofen (ADVIL,MOTRIN) 200 MG tablet Take 400 mg by mouth every 6 (six) hours as needed. For pain   Yes Historical Provider, MD  metoprolol (LOPRESSOR) 50 MG tablet Take 50 mg by mouth 2 (two) times daily. 08/05/11  Yes Christiane Ha, MD  omeprazole (PRILOSEC) 40 MG capsule Take 40 mg by mouth daily.   Yes Historical Provider, MD  Saw Palmetto 450 MG CAPS Take 1 capsule by mouth daily.   Yes Historical Provider, MD  torsemide (DEMADEX) 20 MG tablet Take 20 mg by mouth daily as needed (swelling).   Yes Historical Provider, MD  vitamin B-12 (CYANOCOBALAMIN) 500 MCG tablet Take 500  mcg by mouth daily.   Yes Historical Provider, MD    Review of Systems:  Constitutional:  No weight loss, night sweats, Fevers, chills, fatigue.  Head&Eyes: No headache.  No vision loss.  No eye pain or scotoma ENT:  No Difficulty swallowing,Tooth/dental problems,Sore throat,  No ear ache, post nasal drip,  Cardio-vascular:  No chest pain, Orthopnea, PND, swelling in lower extremities,   palpitations  GI:  No  abdominal pain, nausea, vomiting, diarrhea, loss of appetite, hematochezia, melena, heartburn, indigestion, Resp:  No shortness of breath with exertion or at rest. No cough. No coughing up of blood .No wheezing.No chest wall deformity  Skin:  no rash or lesions.  GU:  no dysuria, change in color of urine, no urgency or frequency. No flank pain.  Musculoskeletal:  No joint pain or swelling. No decreased range of motion. No back pain.  Psych:  No change in mood or affect. No depression or anxiety. Neurologic: No headache, no dysesthesia, no focal weakness, no vision loss. No syncope  Physical Exam: Filed Vitals:  10/05/13 1418 10/05/13 1421 10/05/13 1607 10/05/13 1613  BP: 199/102   208/109  Pulse:    60  Temp:   97.2 F (36.2 C)   TempSrc:      Resp: 16   18  SpO2:  100%  98%   General:  A&O x 3, NAD, nontoxic, pleasant/cooperative Head/Eye: No conjunctival hemorrhage, no icterus, Wallace/AT, No nystagmus ENT:  No icterus,  No thrush, good dentition, no pharyngeal exudate Neck:  No masses, no lymphadenpathy, no bruits CV:  RRR, no rub, no gallop, no S3 Lung:  CTAB, good air movement, no wheeze, no rhonchi Abdomen: soft/NT, +BS, nondistended, no peritoneal signs Ext: No cyanosis, No rashes, No petechiae, No lymphangitis, 1+LE edema Neuro: No tongue deviation, posterior pharynx rises symmetrically. The facial droop noted. EOMI, PERRLA, bilateral shoulder shrug equal. Sensation intact bilaterally. Babinski equivocal bilaterally.  Labs on Admission:  Basic Metabolic  Panel:  Recent Labs Lab 10/05/13 1225  NA 139  K 5.2*  CL 105  CO2 26  GLUCOSE 121*  BUN 35*  CREATININE 2.22*  CALCIUM 9.3   Liver Function Tests: No results found for this basename: AST, ALT, ALKPHOS, BILITOT, PROT, ALBUMIN,  in the last 168 hours No results found for this basename: LIPASE, AMYLASE,  in the last 168 hours No results found for this basename: AMMONIA,  in the last 168 hours CBC:  Recent Labs Lab 10/05/13 1225  WBC 5.4  NEUTROABS 3.9  HGB 11.3*  HCT 35.0*  MCV 94.6  PLT 159   Cardiac Enzymes:  Recent Labs Lab 10/05/13 1225  TROPONINI <0.30   BNP: No components found with this basename: POCBNP,  CBG:  Recent Labs Lab 10/05/13 1212  GLUCAP 116*    Radiological Exams on Admission: Ct Head Wo Contrast  10/05/2013   CLINICAL DATA:  Dizziness, weakness  EXAM: CT HEAD WITHOUT CONTRAST  TECHNIQUE: Contiguous axial images were obtained from the base of the skull through the vertex without intravenous contrast.  COMPARISON:  08/03/2011  FINDINGS: No skull fracture is noted. No intracranial hemorrhage, mass effect or midline shift. There is mucosal thickening left maxillary sinus. The mastoid air cells are unremarkable. Stable cerebral atrophy. Periventricular and patchy subcortical white matter decreased attenuation consistent with chronic small vessel ischemic changes. Again noted encephalomalacia due to old infarcts bilateral frontal lobe, right parietal lobe. There is interval lacunar infarct in right basal ganglia of indeterminate age. No acute cortical infarction. No mass lesion is noted on this unenhanced scan.  IMPRESSION: No acute intracranial abnormality. Stable atrophy and chronic white matter disease.Again noted encephalomalacia due to old infarcts bilateral frontal lobe, right parietal lobe. There is interval lacunar infarct in right basal ganglia of indeterminate age. No acute cortical infarction.   Electronically Signed   By: Natasha Mead M.D.   On:  10/05/2013 12:59   Dg Chest Port 1 View  10/05/2013   CLINICAL DATA:  Dizziness and hypertension ; COPD  EXAM: PORTABLE CHEST - 1 VIEW  COMPARISON:  November 11, 2011  FINDINGS: There is underlying emphysematous change. There is mild scarring in the bases. There is no edema or consolidation. Heart is enlarged but stable. Pulmonary vascularity reflects underlying emphysema. Aorta is prominent with atherosclerotic change, stable. Pacemaker leads are attached to the right atrium and right ventricle. No pneumothorax. No adenopathy. No bone lesions.  IMPRESSION: Underlying emphysema. No edema or consolidation. Stable cardiomegaly. Stable prominence of the thoracic aorta with atherosclerotic change. Question hypertensive change.   Electronically Signed  By: Bretta Bang M.D.   On: 10/05/2013 14:09    EKG: Independently reviewed. AV paced    Time spent:70 minutes Code Status:   FULL Family Communication:   wife at bedside   Darius Tanzi, DO  [facility] Pager [phone]  If 7PM-7AM, please contact night-coverage [email or web address] Password [email or web address] 10/05/2013, 4:20 PM

## 2013-10-05 NOTE — ED Notes (Signed)
EMS reports pt woke up this am feeling dizzy.  Wife reported to ems that pt's bp was elevated.  BP was 200/110 upon their arrival.  Upon arrival to ED bp 182/90.  Pt has history of CVA x 3.  Last stroke was 2009.  Pt has residual left sided weakness from old stroke.   Pt said last night felt normal when he went to bed but had some worsening weakness on left side this am.  Also noticed some left sided facial droop.  Unknown if it is new.  Pt alert and oriented, answers questions but is slow to respond at times.

## 2013-10-05 NOTE — ED Notes (Signed)
Pt unable to void at this time. 

## 2013-10-05 NOTE — ED Notes (Signed)
Pt ambulated with walker unassisted

## 2013-10-05 NOTE — ED Notes (Signed)
Doctor at bedside.

## 2013-10-06 ENCOUNTER — Inpatient Hospital Stay (HOSPITAL_COMMUNITY): Payer: Medicare Other

## 2013-10-06 DIAGNOSIS — I6350 Cerebral infarction due to unspecified occlusion or stenosis of unspecified cerebral artery: Principal | ICD-10-CM

## 2013-10-06 DIAGNOSIS — I10 Essential (primary) hypertension: Secondary | ICD-10-CM

## 2013-10-06 DIAGNOSIS — I359 Nonrheumatic aortic valve disorder, unspecified: Secondary | ICD-10-CM

## 2013-10-06 DIAGNOSIS — I639 Cerebral infarction, unspecified: Secondary | ICD-10-CM

## 2013-10-06 LAB — BASIC METABOLIC PANEL
BUN: 33 mg/dL — ABNORMAL HIGH (ref 6–23)
CO2: 22 mEq/L (ref 19–32)
Chloride: 105 mEq/L (ref 96–112)
GFR calc Af Amer: 30 mL/min — ABNORMAL LOW (ref >90)
Glucose, Bld: 91 mg/dL (ref 70–99)
Potassium: 4.3 mEq/L (ref 3.5–5.1)

## 2013-10-06 LAB — LIPID PANEL
Cholesterol: 185 mg/dL (ref 0–200)
VLDL: 13 mg/dL (ref 0–40)

## 2013-10-06 LAB — HEMOGLOBIN A1C
Hgb A1c MFr Bld: 6 % — ABNORMAL HIGH (ref <5.7)
Mean Plasma Glucose: 126 mg/dL — ABNORMAL HIGH (ref <117)

## 2013-10-06 LAB — CBC
Hemoglobin: 11.7 g/dL — ABNORMAL LOW (ref 13.0–17.0)
MCHC: 32.9 g/dL (ref 30.0–36.0)
Platelets: 171 10*3/uL (ref 150–400)
RDW: 14.2 % (ref 11.5–15.5)
WBC: 5.8 10*3/uL (ref 4.0–10.5)

## 2013-10-06 LAB — TROPONIN I
Troponin I: <0.3 ng/mL (ref <0.30)
Troponin I: <0.3 ng/mL (ref <0.30)

## 2013-10-06 MED ORDER — ASPIRIN-DIPYRIDAMOLE ER 25-200 MG PO CP12
1.0000 | ORAL_CAPSULE | Freq: Every day | ORAL | Status: DC
  Administered 2013-10-07: 1 via ORAL
  Filled 2013-10-06 (×5): qty 1

## 2013-10-06 MED ORDER — NITROGLYCERIN 0.4 MG SL SUBL
0.4000 mg | SUBLINGUAL_TABLET | SUBLINGUAL | Status: DC | PRN
  Administered 2013-10-06: 0.4 mg via SUBLINGUAL
  Filled 2013-10-06: qty 25

## 2013-10-06 MED ORDER — HYDRALAZINE HCL 25 MG PO TABS
25.0000 mg | ORAL_TABLET | Freq: Three times a day (TID) | ORAL | Status: DC
  Administered 2013-10-06 – 2013-10-07 (×3): 25 mg via ORAL
  Filled 2013-10-06 (×3): qty 1

## 2013-10-06 NOTE — Discharge Summary (Addendum)
Physician Discharge Summary  [name]:[id number] DOB: [birth year] DOA: 10/05/2013  PCP: Colette Ribas, MD  Admit date: 10/05/2013 Discharge date: 10/07/2013  Recommendations for Outpatient Follow-up:  1. Pt will need to follow up with PCP in 2 weeks post discharge 2. Please obtain BMP to evaluate electrolytes and kidney function 3. Please also check CBC to evaluate Hg and Hct levels   Discharge Diagnoses:  Active Problems:   Malignant HTN with heart disease, w/o CHF, with chronic kidney disease   Leg weakness, bilateral   Facial droop   Unspecified essential hypertension   CKD (chronic kidney disease) stage 4, GFR 15-29 ml/min   Chronic diastolic CHF (congestive heart failure)   Acute ischemic stroke Acute ischemic stroke  -Although this cannot be verified by MRI, the patient's clinical syndrome and physical exam suggests likely extension of his right sided infarct as well as a new subcortical lacunar infarct  -Discontinue aspirin  -Start Aggrenox--1 tablet daily x3 days, then twice a day--he will start twice a day dosing on 10/10/2013 -Patient has rash to Plavix  -Optimize blood pressure  -Echocardiogram shows EF 60-65%, grade 1 diastolic dysfunction, mild/moderate aortic stenosis  -appreciate neurology recommendations  -PT eval recommended HHPT-->case manager assisted in setting up -HbA1c= 6.0 Carotid stenosis  -Carotid ultrasound suggests 70% proximal right ICA stenosis and 85% left ICA stenosis slightly beyond the proximal left ICA  -Had long discussion with the patient's family and the patient regarding further workup in the setting of CKD stage IV and risk of any surgical intervention given the patient's cardiac status  -The risks, benefits, and alternatives were discussed  -The patient and family do not wish to further pursue any Further imaging or surgery; they wish to treat medically.  Hypertension  -Restart metoprolol tartrate, diltiazem  -Added  hydralazine during the hospitalization 25 mg bid -Added Imdur 15 mg daily Hyperlipidemia  -LDL 134  -Start Lipitor 20 mg daily  Elevated proBNP  -Clinically, patient appears to be compensated  -Likely resulting from the patient's CKD  Dizziness/generalized weakness  -improving  -orthostatic vital signs--neg  -UA with reflex to urine culture--no pyuria  -troponins neg x 3  CKD stage IV  - Baseline creatinine 2.2-2.5  Chronic diastolic CHF  -EF 60-65%, grade 1 diastolic dysfunction  Hyperkalemia  -resolved with Kayexalate x 1   Discharge Condition: stable  Disposition: home  Diet:heart healthy Wt Readings from Last 3 Encounters:  10/05/13 84.2 kg (185 lb 10 oz)  08/12/13 85.231 kg (187 lb 14.4 oz)  03/16/12 87.998 kg (194 lb)    History of present illness:  79 year old male with a history of stroke, hypertension, sick sinus syndrome, coronary artery disease, chronic diastolic CHF presented with one-day history of leg weakness, generalized weakness, and dizziness. The patient is a poor historian. Part of this history is obtained from speaking with the patient's wife. Around 6 AM on the morning of admission, the patient was trying to get out of bed and subsequently slid out of bed without any falling or syncope. He was feeling dizzy at the time. He was resistant to coming to the emergency department. He went back to bed and subsequent workup 9 AM in the morning. He continued to describe lower extremity weakness with some leaning to his left side. His wife also noted that he had some left facial drip that was slightly worse than usual. She stated that his speech was normal. There is no visual disturbance, or syncope. His blood pressure was checked at  home with systolic PA pressure in the 200s. Because of his lower extremities weakness, his emergency department. He denies any chest discomfort, shortness breath, nausea, vomiting, fevers, chills, dysuria, hematuria, abdominal pain.  In ED,  CT of the brain did not show any acute stroke but showed an indeterminate age right basal ganglia stroke. Chest x-ray was negative. Her BNP was 3785. Initial troponin was negative. Potassium was 5.2. Serum creatinine 2.2. CBC was unremarkable. Neurology was consulted to see the patient. They felt that the patient likely had an extension of his right old right-sided infarct as well as a new subcortical lacunar infarct. The patient's leg weakness showed improvement. Physical therapy was consulted. They recommended home health which was up with assistance of case management. Aspirin was discontinued. The patient was started on Aggrenox. Workup for the patient's stroke revealed carotid stenosis. After a long discussion with the patient's family, they decided not to pursue any further diagnostic testing or aggressive surgical intervention due to the patient's age and multiple comorbidities. The patient was allowed to have permissive hypertension in the first 24 hours. His antihypertensive regimen was restarted on the following day. However his blood pressure remained elevated. Hydralazine and imdur was added to his regimen. Bradycardia limited increasing dose of his diltiazem and metoprolol.     Consultants: neurology  Discharge Exam: Filed Vitals:   10/07/13 1414  BP: 122/65  Pulse: 60  Temp: 98.2 F (36.8 C)  Resp: 18   Filed Vitals:   10/07/13 0314 10/07/13 0541 10/07/13 1006 10/07/13 1414  BP: 182/101 191/106 144/61 122/65  Pulse: 60 61 61 60  Temp: 97.8 F (36.6 C) 97.7 F (36.5 C) 97.3 F (36.3 C) 98.2 F (36.8 C)  TempSrc: Oral Oral Oral Oral  Resp: 18 18 18 18   Height:      Weight:      SpO2: 95% 96% 95% 93%   General: A&O x 3, NAD, pleasant, cooperative Cardiovascular: RRR, no rub, no gallop, no S3 Respiratory: CTAB, no wheeze, no rhonchi Abdomen:soft, nontender, nondistended, positive bowel sounds Extremities: trace LE edema, No lymphangitis, no petechiae  Discharge  Instructions      Discharge Orders   Future Appointments Provider Department Dept Phone   10/11/2013 2:15 PM [name], MD [facility] [phone]   Future Orders Complete By Expires   Diet - low sodium heart healthy  As directed    Discharge instructions  As directed    Comments:     Take Aggrenox 1 tablet in the morning of 10/08/2013 and 1 tablet in the morning of 10/09/2013. Starting 10/10/2013, take Aggrenox one tablet 2 times a day   Increase activity slowly  As directed        Medication List    STOP taking these medications       aspirin EC 81 MG tablet     ibuprofen 200 MG tablet  Commonly known as:  ADVIL,MOTRIN      TAKE these medications       atorvastatin 20 MG tablet  Commonly known as:  LIPITOR  Take 1 tablet (20 mg total) by mouth daily at 6 PM.     diltiazem 180 MG 24 hr capsule  Commonly known as:  TIAZAC  Take 180 mg by mouth daily.     dipyridamole-aspirin 200-25 MG per 12 hr capsule  Commonly known as:  AGGRENOX  Take 1 capsule by mouth 2 (two) times daily.     doxazosin 1 MG tablet  Commonly known as:  CARDURA  Take 1 mg by mouth at bedtime.     ferrous sulfate 325 (65 FE) MG tablet  Take 325 mg by mouth daily.     fluticasone 50 MCG/ACT nasal spray  Commonly known as:  FLONASE  Place 2 sprays into the nose daily as needed. For allergies     hydrALAZINE 25 MG tablet  Commonly known as:  APRESOLINE  Take 1 tablet (25 mg total) by mouth every 12 (twelve) hours.  Start taking on:  10/08/2013     isosorbide mononitrate 15 mg Tb24 24 hr tablet  Commonly known as:  IMDUR  Take 0.5 tablets (15 mg total) by mouth daily.     metoprolol 50 MG tablet  Commonly known as:  LOPRESSOR  Take 50 mg by mouth 2 (two) times daily.     omeprazole 40 MG capsule  Commonly known as:  PRILOSEC  Take 40 mg by mouth daily.     Saw Palmetto 450 MG Caps  Take 1 capsule by mouth daily.     torsemide 20 MG tablet  Commonly known as:   DEMADEX  Take 20 mg by mouth daily as needed (swelling).     vitamin B-12 500 MCG tablet  Commonly known as:  CYANOCOBALAMIN  Take 500 mcg by mouth daily.     Vitamin D 2000 UNITS tablet  Take 2,000 Units by mouth daily.         The results of significant diagnostics from this hospitalization (including imaging, microbiology, ancillary and laboratory) are listed below for reference.    Significant Diagnostic Studies: Ct Head Wo Contrast  10/05/2013   CLINICAL DATA:  Dizziness, weakness  EXAM: CT HEAD WITHOUT CONTRAST  TECHNIQUE: Contiguous axial images were obtained from the base of the skull through the vertex without intravenous contrast.  COMPARISON:  08/03/2011  FINDINGS: No skull fracture is noted. No intracranial hemorrhage, mass effect or midline shift. There is mucosal thickening left maxillary sinus. The mastoid air cells are unremarkable. Stable cerebral atrophy. Periventricular and patchy subcortical white matter decreased attenuation consistent with chronic small vessel ischemic changes. Again noted encephalomalacia due to old infarcts bilateral frontal lobe, right parietal lobe. There is interval lacunar infarct in right basal ganglia of indeterminate age. No acute cortical infarction. No mass lesion is noted on this unenhanced scan.  IMPRESSION: No acute intracranial abnormality. Stable atrophy and chronic white matter disease.Again noted encephalomalacia due to old infarcts bilateral frontal lobe, right parietal lobe. There is interval lacunar infarct in right basal ganglia of indeterminate age. No acute cortical infarction.   Electronically Signed   By: Natasha Mead M.D.   On: 10/05/2013 12:59   US Carotid Duplex Bilateral  10/05/2013   CLINICAL DATA:  Dizziness; evidence of prior CVA  EXAM: BILATERAL CAROTID DUPLEX ULTRASOUND  TECHNIQUE: Wallace Cullens scale imaging, color Doppler and duplex ultrasound were performed of bilateral carotid and vertebral arteries in the neck.  COMPARISON:   August 04, 2011  FINDINGS: Criteria: Quantification of carotid stenosis is based on velocity parameters that correlate the residual internal carotid diameter with NASCET-based stenosis levels, using the diameter of the distal internal carotid lumen as the denominator for stenosis measurement.  The following velocity measurements were obtained:  RIGHT  ICA:  75/20 cm/sec  CCA:  47/8 cm/sec  SYSTOLIC ICA/CCA RATIO:  1.6  DIASTOLIC ICA/CCA RATIO:  2.6  ECA:  112 cm/sec peak systolic  LEFT  ICA:  83/26 cm/sec  CCA:  48/8 cm/sec  SYSTOLIC ICA/CCA RATIO:  1.7  DIASTOLIC ICA/CCA RATIO:  3.2  ECA:  108  cm/sec peak systolic  RIGHT CAROTID ARTERY: There is diffuse intimal thickening. There is irregular heterogeneous echogenicity plaque throughout the bifurcation and proximal right internal carotid artery. There are areas of aliasing with a color flow suggesting hemodynamically significant obstruction. By real-time interrogation, there appears to be approximately 70% diameter stenosis in the proximal right internal carotid artery.  RIGHT VERTEBRAL ARTERY:  Flow is antegrade.  LEFT CAROTID ARTERY: There is extensive plaque throughout the bifurcation and proximal internal carotid artery. This plaque is heterogeneous in echotexture. Areas of color flow a aliasing are seen in this vessel. There are multiple areas of greater than 50% diameter stenosis in the bifurcation and proximal internal carotid artery. There is an area of that appears to represent approximately 85% diameter stenosis slightly beyond the bifurcation in the proximal left internal carotid artery.  LEFT VERTEBRAL ARTERY:  Nonvisualized.  IMPRESSION: By real-time interrogation, there appears to be hemodynamically significant obstruction in both proximal internal carotid arteries, somewhat more severe on the left than on the right. Color flow Doppler aliasing suggests that there is indeed hemodynamically significant stenosis in these vessels. The peak systolic  velocities do not indicate obstruction of this degree; there reason for this discrepancy is frankly uncertain. The  Flow in the right vertebral artery is in the anatomic direction. The left vertebral artery could not be visualized.  Given the discrepancy between real-time interrogation an velocity measurements as well as nonvisualization of the left vertebral artery, correlation with CT or MR angiography of the extracranial carotid and vertebral circulation is felt to be advisable.   Electronically Signed   By: Bretta Bang M.D.   On: 10/05/2013 18:39   Dg Chest Port 1 View  10/05/2013   CLINICAL DATA:  Dizziness and hypertension ; COPD  EXAM: PORTABLE CHEST - 1 VIEW  COMPARISON:  November 11, 2011  FINDINGS: There is underlying emphysematous change. There is mild scarring in the bases. There is no edema or consolidation. Heart is enlarged but stable. Pulmonary vascularity reflects underlying emphysema. Aorta is prominent with atherosclerotic change, stable. Pacemaker leads are attached to the right atrium and right ventricle. No pneumothorax. No adenopathy. No bone lesions.  IMPRESSION: Underlying emphysema. No edema or consolidation. Stable cardiomegaly. Stable prominence of the thoracic aorta with atherosclerotic change. Question hypertensive change.   Electronically Signed   By: Bretta Bang M.D.   On: 10/05/2013 14:09     Microbiology: No results found for this or any previous visit (from the past 240 hour(s)).   Labs: Basic Metabolic Panel:  Recent Labs Lab 10/05/13 1225 10/06/13 0531 10/07/13 0746  NA 139 139 137  K 5.2* 4.3 4.6  CL 105 105 105  CO2 26 22 24   GLUCOSE 121* 91 92  BUN 35* 33* 33*  CREATININE 2.22* 2.08* 2.22*  CALCIUM 9.3 8.9 9.0   Liver Function Tests: No results found for this basename: AST, ALT, ALKPHOS, BILITOT, PROT, ALBUMIN,  in the last 168 hours No results found for this basename: LIPASE, AMYLASE,  in the last 168 hours No results found for this  basename: AMMONIA,  in the last 168 hours CBC:  Recent Labs Lab 10/05/13 1225 10/06/13 0531  WBC 5.4 5.8  NEUTROABS 3.9  --   HGB 11.3* 11.7*  HCT 35.0* 35.6*  MCV 94.6 93.7  PLT 159 171   Cardiac Enzymes:  Recent Labs Lab 10/05/13 1225 10/05/13 1846 10/06/13 0043 10/06/13 0531  TROPONINI <  0.30 <0.30 <0.30 <0.30   BNP: No components found with this basename: POCBNP,  CBG:  Recent Labs Lab 10/05/13 1212  GLUCAP 116*    Time coordinating discharge:  Greater than 30 minutes  Signed:  Margy Sumler, DO [facility] Pager: [phone] 10/07/2013, 3:33 PM

## 2013-10-06 NOTE — Progress Notes (Signed)
Late entry, at 0549 patient complained of chest pain, ordered stat EKG, paged on-call physician and followed orders given. After questioning the patient further, this chest pain and discomfort is not new for him it happens when he walks with his walker, he states that he usually treats it with nitroglycerin spray. Nitroglycerin was given and chest discomfort was relieved, no change in patient status.

## 2013-10-06 NOTE — Progress Notes (Signed)
*  PRELIMINARY RESULTS* Echocardiogram 2D Echocardiogram has been performed.  [name] 10/06/2013, 9:24 AM

## 2013-10-06 NOTE — Care Management Note (Unsigned)
    Page 1 of 1   10/06/2013     3:18:27 PM   CARE MANAGEMENT NOTE 10/06/2013  Patient:  ADIT, RIDDLES   Account Number:  [id number]  Date Initiated:  10/06/2013  Documentation initiated by:  Rosemary Holms  Subjective/Objective Assessment:   Pt admitted from home where he lives with his wife. Pt  and wife would like HH PT and RN at DC. Previously used [facility] and would like to use them again and requested PT Romeo Apple if he still is at [facility].     Action/Plan:   Anticipated DC Date:     Anticipated DC Plan:  HOME W HOME HEALTH SERVICES      DC Planning Services  CM consult      Choice offered to / List presented to:          [facility] arranged  HH-1 RN  HH-10 DISEASE MANAGEMENT  HH-2 PT      HH agency  [facility]   Status of service:  In process, will continue to follow Medicare Important Message given?   (If response is "NO", the following Medicare IM given date fields will be blank) Date Medicare IM given:   Date Additional Medicare IM given:    Discharge Disposition:    Per UR Regulation:    If discussed at Long Length of Stay Meetings, dates discussed:    Comments:  10/06/13 Rosemary Holms RN BSN CM

## 2013-10-06 NOTE — Progress Notes (Signed)
[facility] PROGRESS NOTE  JAMALE SPANGLER HQI:[id number] DOB: [birth year] DOA: 10/05/2013 PCP: Colette Ribas, MD  Assessment/Plan: Acute ischemic stroke -Although this cannot be verified by MRI, the patient's clinical syndrome and physical exam suggests likely extension of his right sided infarct as well as a new subcortical lacunar infarct -Discontinue aspirin -Start Aggrenox  -Patient has rash to Plavix -Optimize blood pressure -Echocardiogram shows EF 60-65%, grade 1 diastolic dysfunction, mild/moderate aortic stenosis -appreciate neurology recommendations Carotid stenosis -Carotid ultrasound suggests 70% proximal right ICA stenosis and 85% left ICA stenosis slightly beyond the proximal left ICA -Had long discussion with the patient's family and the patient regarding further workup in the setting of CKD stage IV and risk of any surgical intervention given the patient's cardiac status -The risks, benefits, and alternatives were discussed -The patient and family do not wish to further pursue any Further imaging or surgery they wish to treat medically. Hypertension  -Restart metoprolol tartrate, diltiazem  -Add hydralazine  Hyperlipidemia  -LDL 134 -Start Lipitor 20 mg daily  Elevated proBNP  -Clinically, patient appears to be compensated  -Likely resulting from the patient's CKD  Dizziness/generalized weakness  -improving -orthostatic vital signs--neg  -UA with reflex to urine culture--no pyuria  -troponins neg x 3 CKD stage IV  - Baseline creatinine 2.2-2.5 Chronic diastolic CHF  -EF 60-65%, grade 1 diastolic dysfunction  Hyperkalemia -resolved with Kayexalate x 1 Family Communication:   Wife and daughter at beside Disposition Plan:   Home when medically stable  Total time 35 min.     Procedures/Studies: Ct Head Wo Contrast  10/05/2013   CLINICAL DATA:  Dizziness, weakness  EXAM: CT HEAD WITHOUT CONTRAST  TECHNIQUE: Contiguous axial images were obtained  from the base of the skull through the vertex without intravenous contrast.  COMPARISON:  08/03/2011  FINDINGS: No skull fracture is noted. No intracranial hemorrhage, mass effect or midline shift. There is mucosal thickening left maxillary sinus. The mastoid air cells are unremarkable. Stable cerebral atrophy. Periventricular and patchy subcortical white matter decreased attenuation consistent with chronic small vessel ischemic changes. Again noted encephalomalacia due to old infarcts bilateral frontal lobe, right parietal lobe. There is interval lacunar infarct in right basal ganglia of indeterminate age. No acute cortical infarction. No mass lesion is noted on this unenhanced scan.  IMPRESSION: No acute intracranial abnormality. Stable atrophy and chronic white matter disease.Again noted encephalomalacia due to old infarcts bilateral frontal lobe, right parietal lobe. There is interval lacunar infarct in right basal ganglia of indeterminate age. No acute cortical infarction.   Electronically Signed   By: Natasha Mead M.D.   On: 10/05/2013 12:59   US Carotid Duplex Bilateral  10/05/2013   CLINICAL DATA:  Dizziness; evidence of prior CVA  EXAM: BILATERAL CAROTID DUPLEX ULTRASOUND  TECHNIQUE: Wallace Cullens scale imaging, color Doppler and duplex ultrasound were performed of bilateral carotid and vertebral arteries in the neck.  COMPARISON:  August 04, 2011  FINDINGS: Criteria: Quantification of carotid stenosis is based on velocity parameters that correlate the residual internal carotid diameter with NASCET-based stenosis levels, using the diameter of the distal internal carotid lumen as the denominator for stenosis measurement.  The following velocity measurements were obtained:  RIGHT  ICA:  75/20 cm/sec  CCA:  47/8 cm/sec  SYSTOLIC ICA/CCA RATIO:  1.6  DIASTOLIC ICA/CCA RATIO:  2.6  ECA:  112 cm/sec peak systolic  LEFT  ICA:  83/26 cm/sec  CCA:  48/8 cm/sec  SYSTOLIC ICA/CCA RATIO:  1.7  DIASTOLIC ICA/CCA  RATIO:  3.2   ECA:  108  cm/sec peak systolic  RIGHT CAROTID ARTERY: There is diffuse intimal thickening. There is irregular heterogeneous echogenicity plaque throughout the bifurcation and proximal right internal carotid artery. There are areas of aliasing with a color flow suggesting hemodynamically significant obstruction. By real-time interrogation, there appears to be approximately 70% diameter stenosis in the proximal right internal carotid artery.  RIGHT VERTEBRAL ARTERY:  Flow is antegrade.  LEFT CAROTID ARTERY: There is extensive plaque throughout the bifurcation and proximal internal carotid artery. This plaque is heterogeneous in echotexture. Areas of color flow a aliasing are seen in this vessel. There are multiple areas of greater than 50% diameter stenosis in the bifurcation and proximal internal carotid artery. There is an area of that appears to represent approximately 85% diameter stenosis slightly beyond the bifurcation in the proximal left internal carotid artery.  LEFT VERTEBRAL ARTERY:  Nonvisualized.  IMPRESSION: By real-time interrogation, there appears to be hemodynamically significant obstruction in both proximal internal carotid arteries, somewhat more severe on the left than on the right. Color flow Doppler aliasing suggests that there is indeed hemodynamically significant stenosis in these vessels. The peak systolic velocities do not indicate obstruction of this degree; there reason for this discrepancy is frankly uncertain. The  Flow in the right vertebral artery is in the anatomic direction. The left vertebral artery could not be visualized.  Given the discrepancy between real-time interrogation an velocity measurements as well as nonvisualization of the left vertebral artery, correlation with CT or MR angiography of the extracranial carotid and vertebral circulation is felt to be advisable.   Electronically Signed   By: Bretta Bang M.D.   On: 10/05/2013 18:39   Dg Chest Port 1  View  10/05/2013   CLINICAL DATA:  Dizziness and hypertension ; COPD  EXAM: PORTABLE CHEST - 1 VIEW  COMPARISON:  November 11, 2011  FINDINGS: There is underlying emphysematous change. There is mild scarring in the bases. There is no edema or consolidation. Heart is enlarged but stable. Pulmonary vascularity reflects underlying emphysema. Aorta is prominent with atherosclerotic change, stable. Pacemaker leads are attached to the right atrium and right ventricle. No pneumothorax. No adenopathy. No bone lesions.  IMPRESSION: Underlying emphysema. No edema or consolidation. Stable cardiomegaly. Stable prominence of the thoracic aorta with atherosclerotic change. Question hypertensive change.   Electronically Signed   By: Bretta Bang M.D.   On: 10/05/2013 14:09         Subjective: Patient is feeling better today. He feels that his leg strength is better than yesterday. Denies any headache, dizziness, visual disturbance, dysarthria, fevers, chills, chest pain, shortness breath, nausea, vomiting, diarrhea, dysuria.   Objective: Filed Vitals:   10/06/13 0548 10/06/13 0633 10/06/13 0923 10/06/13 1426  BP: 217/102 161/82 195/89 191/91  Pulse: 70  60 79  Temp: 97.3 F (36.3 C)   97.8 F (36.6 C)  TempSrc: Oral   Oral  Resp: 20   18  Height:      Weight:      SpO2: 96%   95%    Intake/Output Summary (Last 24 hours) at 10/06/13 1440 Last data filed at 10/06/13 1030  Gross per 24 hour  Intake    240 ml  Output    400 ml  Net   -160 ml   Weight change:  Exam:   General:  Pt is alert, follows commands appropriately, not in acute distress  HEENT: No icterus, No thrush,  Nowthen/AT  Cardiovascular:  RRR, S1/S2, no rubs, no gallops  Respiratory: CTA bilaterally, no wheezing, no crackles, no rhonchi  Abdomen: Soft/+BS, non tender, non distended, no guarding  Extremities: trace LE edema, No lymphangitis, No petechiae, No rashes, no synovitis  Data Reviewed: Basic Metabolic  Panel:  Recent Labs Lab 10/05/13 1225 10/06/13 0531  NA 139 139  K 5.2* 4.3  CL 105 105  CO2 26 22  GLUCOSE 121* 91  BUN 35* 33*  CREATININE 2.22* 2.08*  CALCIUM 9.3 8.9   Liver Function Tests: No results found for this basename: AST, ALT, ALKPHOS, BILITOT, PROT, ALBUMIN,  in the last 168 hours No results found for this basename: LIPASE, AMYLASE,  in the last 168 hours No results found for this basename: AMMONIA,  in the last 168 hours CBC:  Recent Labs Lab 10/05/13 1225 10/06/13 0531  WBC 5.4 5.8  NEUTROABS 3.9  --   HGB 11.3* 11.7*  HCT 35.0* 35.6*  MCV 94.6 93.7  PLT 159 171   Cardiac Enzymes:  Recent Labs Lab 10/05/13 1225 10/05/13 1846 10/06/13 0043 10/06/13 0531  TROPONINI <0.30 <0.30 <0.30 <0.30   BNP: No components found with this basename: POCBNP,  CBG:  Recent Labs Lab 10/05/13 1212  GLUCAP 116*    No results found for this or any previous visit (from the past 240 hour(s)).   Scheduled Meds: . cholecalciferol  2,000 Units Oral Daily  . diltiazem  180 mg Oral Daily  . [START ON 10/07/2013] dipyridamole-aspirin  1 capsule Oral Daily  . doxazosin  1 mg Oral QHS  . enoxaparin (LOVENOX) injection  30 mg Subcutaneous Q24H  . ferrous sulfate  325 mg Oral Daily  . hydrALAZINE  25 mg Oral Q8H  . metoprolol  50 mg Oral BID  . pantoprazole  80 mg Oral Daily  . vitamin B-12  500 mcg Oral Daily   Continuous Infusions:    Sanaai Doane, DO  [facility] Pager [phone]  If 7PM-7AM, please contact night-coverage [email or web address] Password [email or web address] 10/06/2013, 2:40 PM   LOS: 1 day

## 2013-10-06 NOTE — Progress Notes (Signed)
SLP Cancellation Note  Patient Details Name: [name] MRN: [id number] DOB: [birth year]   Cancelled treatment:       Reason Eval/Treat Not Completed: SLP screened, no needs identified, will sign off. Pt and wife interviewed and both report that pt's cognitive linguistic skills are at baseline.   Thank you,  Havery Moros, CCC-SLP [phone]    PORTER,DABNEY 10/06/2013, 2:27 PM

## 2013-10-06 NOTE — Progress Notes (Signed)
Utilization Review Complete  

## 2013-10-06 NOTE — Evaluation (Signed)
Physical Therapy Evaluation Patient Details Name: [name] MRN: [id number] DOB: [birth year] Today's Date: 10/06/2013 Time: 9562-1308 PT Time Calculation (min): 39 min  PT Assessment / Plan / Recommendation History of Present Illness  Pt is admitted with probable progression of an old lacunar right infarct, symptoms including increased weakness of the left side, decreased standing balance and dizziness.  He has a hx of CHF and lives with his wife, independent with ADLs and ambulates with a 4 wheeled walker.  Clinical Impression   Pt was seen for evaluation.  He is alert and oriented, cooperative.  There seems to be a mild increase of weakness in the LLE, although it is difficult to determine what his baseline actually is.  He has some gait abnormalities that set him up for falls at home, so he would benefit from HHPT at d/c.  We will work on balance, strengthening and mobility while here in hospital.    PT Assessment  Patient needs continued PT services    Follow Up Recommendations  Home health PT    Does the patient have the potential to tolerate intense rehabilitation      Barriers to Discharge        Equipment Recommendations  None recommended by PT    Recommendations for Other Services OT consult   Frequency Min 5X/week    Precautions / Restrictions Precautions Precautions: Fall Restrictions Weight Bearing Restrictions: No   Pertinent Vitals/Pain       Mobility  Bed Mobility Bed Mobility: Supine to Sit Supine to Sit: 6: Modified independent (Device/Increase time);HOB flat Transfers Transfers: Sit to Stand;Stand to Sit Sit to Stand: 5: Supervision;With upper extremity assist Stand to Sit: 5: Supervision;With upper extremity assist Ambulation/Gait Ambulation/Gait Assistance: 4: Min guard Ambulation Distance (Feet): 40 Feet Assistive device: Rolling walker Gait Pattern: Decreased trunk rotation;Left foot flat;Decreased dorsiflexion - left;Decreased stance  time - left;Decreased weight shift to left;Decreased hip/knee flexion - left;Left genu recurvatum Gait velocity: WNL General Gait Details: It is difficult to determine if gait pattern is significantly different from his norm...he does tend to drad the LLE and might benefit from an AFO Stairs: No Wheelchair Mobility Wheelchair Mobility: No Modified Rankin (Stroke Patients Only) Pre-Morbid Rankin Score: Moderate disability Modified Rankin: Moderately severe disability    Exercises     PT Diagnosis: Abnormality of gait;Hemiplegia non-dominant side  PT Problem List: Decreased strength;Decreased activity tolerance;Decreased balance;Decreased mobility PT Treatment Interventions: Gait training;Stair training;Balance training;Neuromuscular re-education     PT Goals(Current goals can be found in the care plan section) Acute Rehab PT Goals Patient Stated Goal: none stated PT Goal Formulation: With patient Time For Goal Achievement: 10/20/13 Potential to Achieve Goals: Good  Visit Information  Last PT Received On: 10/06/13 History of Present Illness: Pt is admitted with probable progression of an old lacunar right infarct, symptoms including increased weakness of the left side, decreased standing balance and dizziness.  He has a hx of CHF and lives with his wife, independent with ADLs and ambulates with a 4 wheeled walker.       Prior Functioning  Home Living Family/patient expects to be discharged to:: Private residence Living Arrangements: Spouse/significant other Available Help at Discharge: Family Type of Home: House Home Access: Stairs to enter Secretary/administrator of Steps: 3 Entrance Stairs-Rails: Right;Left Home Layout: One level Home Equipment: Environmental consultant - 4 wheels;Shower seat - built in;Grab bars - tub/shower;Hand held shower head Prior Function Level of Independence: Independent with assistive device(s) Communication Communication: No difficulties Dominant Hand:  Right     Cognition  Cognition Arousal/Alertness: Awake/alert Behavior During Therapy: WFL for tasks assessed/performed Overall Cognitive Status: Within Functional Limits for tasks assessed    Extremity/Trunk Assessment Lower Extremity Assessment Lower Extremity Assessment: LLE deficits/detail LLE Deficits / Details: strength 4-/5 on MMT with coordination WNL.Marland KitchenMarland Kitchenfunctionally, pt appears to be much weaker than this during gait Cervical / Trunk Assessment Cervical / Trunk Assessment: Kyphotic   Balance Balance Balance Assessed: Yes Dynamic Sitting Balance Dynamic Sitting - Balance Support: Feet supported;No upper extremity supported Dynamic Sitting - Level of Assistance: 6: Modified independent (Device/Increase time) Static Standing Balance Static Standing - Balance Support: No upper extremity supported Static Standing - Level of Assistance: 4: Min assist  End of Session PT - End of Session Equipment Utilized During Treatment: Gait belt Activity Tolerance: Patient tolerated treatment well Patient left: in chair;with call bell/phone within reach;with chair alarm set  GP     Konrad Penta 10/06/2013, 12:23 PM

## 2013-10-07 LAB — BASIC METABOLIC PANEL
BUN: 33 mg/dL — ABNORMAL HIGH (ref 6–23)
Calcium: 9 mg/dL (ref 8.4–10.5)
Chloride: 105 mEq/L (ref 96–112)
GFR calc Af Amer: 28 mL/min — ABNORMAL LOW (ref >90)
GFR calc non Af Amer: 24 mL/min — ABNORMAL LOW (ref >90)
Potassium: 4.6 mEq/L (ref 3.5–5.1)
Sodium: 137 mEq/L (ref 135–145)

## 2013-10-07 MED ORDER — ATORVASTATIN CALCIUM 20 MG PO TABS: 20.0000 mg | ORAL_TABLET | Freq: Every day | ORAL | Status: DC

## 2013-10-07 MED ORDER — HYDRALAZINE HCL 25 MG PO TABS
50.0000 mg | ORAL_TABLET | Freq: Three times a day (TID) | ORAL | Status: DC
  Administered 2013-10-07: 50 mg via ORAL
  Filled 2013-10-07: qty 2

## 2013-10-07 MED ORDER — HYDRALAZINE HCL 25 MG PO TABS: 25.0000 mg | ORAL_TABLET | Freq: Two times a day (BID) | ORAL | Status: DC

## 2013-10-07 MED ORDER — ISOSORBIDE MONONITRATE 15 MG HALF TABLET: 15.0000 mg | ORAL_TABLET | Freq: Every day | ORAL | Status: DC

## 2013-10-07 MED ORDER — HYDRALAZINE HCL 25 MG PO TABS
25.0000 mg | ORAL_TABLET | Freq: Once | ORAL | Status: AC
  Administered 2013-10-07: 25 mg via ORAL
  Filled 2013-10-07: qty 1

## 2013-10-07 MED ORDER — ASPIRIN-DIPYRIDAMOLE ER 25-200 MG PO CP12: 1.0000 | ORAL_CAPSULE | Freq: Every day | ORAL | Status: DC

## 2013-10-07 MED ORDER — ATORVASTATIN CALCIUM 20 MG PO TABS: 20.0000 mg | ORAL_TABLET | Freq: Every day | ORAL | Status: AC

## 2013-10-07 MED ORDER — ASPIRIN-DIPYRIDAMOLE ER 25-200 MG PO CP12: 1.0000 | ORAL_CAPSULE | Freq: Two times a day (BID) | ORAL | Status: DC

## 2013-10-07 MED ORDER — ISOSORBIDE MONONITRATE ER 30 MG PO TB24
15.0000 mg | ORAL_TABLET | Freq: Every day | ORAL | Status: DC
  Administered 2013-10-07: 15 mg via ORAL
  Filled 2013-10-07: qty 1

## 2013-10-07 NOTE — Progress Notes (Signed)
PT Cancellation Note  Patient Details Name: [name] MRN: [id number] DOB: 10/05/1919   Cancelled Treatment:    Reason Eval/Treat Not Completed: Fatigue/lethargy limiting ability to participate Pt states that he is very sleepy and unable to work with me today.  If he is still here tomorrow, we will work with him then.  Myrlene Broker L 10/07/2013, 1:46 PM

## 2013-10-07 NOTE — Progress Notes (Signed)
Patient ID: [name], male   DOB: December 25, 1918, 79 y.o.   MRN: [id number]  [facility] NEUROLOGY Hargun Spurling A. Gerilyn Pilgrim, MD     [email or web address]          [name] is an 79 y.o. male.   Assessment/Plan:  1. Likely acute right subcortical lacunar infarct. The patient has tolerated Aggrenox so far. He otherwise seems to be fairly well and has improved since being hospitalized. Continue with current care risk factor modification. The reconsult as needed.  The patient reports that he is actually doing a lot better. He thinks that he is at baseline. The patient is awake and alert. He does not have any ptosis at this time. Pupils are equal and reactive facial muscles are symmetric. The left side shows strength graded as 4+/5 both upper and lower extremity except for marked impairment of the left hand which is the patient's baseline. He has severe impairment of hand dexterity and fine finger movements on that side. Strength is about 3/5.   Objective: Vital signs in last 24 hours: Temp:  [97.7 F (36.5 C)-98.1 F (36.7 C)] 97.7 F (36.5 C) (12/12 0541) Pulse Rate:  [60-79] 61 (12/12 0541) Resp:  [18] 18 (12/12 0541) BP: (182-195)/(89-106) 191/106 mmHg (12/12 0541) SpO2:  [95 %-97 %] 96 % (12/12 0541)  Intake/Output from previous day: 12/11 0701 - 12/12 0700 In: -  Out: 500 [Urine:500] Intake/Output this shift:   Nutritional status: Cardiac   Lab Results: Results for orders placed during the hospital encounter of 10/05/13 (from the past 48 hour(s))  GLUCOSE, CAPILLARY     Status: Abnormal   Collection Time    10/05/13 12:12 PM      Result Value Range   Glucose-Capillary 116 (*) 70 - 99 mg/dL  CBC WITH DIFFERENTIAL     Status: Abnormal   Collection Time    10/05/13 12:25 PM      Result Value Range   WBC 5.4  4.0 - 10.5 K/uL   RBC 3.70 (*) 4.22 - 5.81 MIL/uL   Hemoglobin 11.3 (*) 13.0 - 17.0 g/dL   HCT 40.9 (*) 81.1 - 91.4 %   MCV 94.6  78.0 - 100.0 fL   MCH 30.5   26.0 - 34.0 pg   MCHC 32.3  30.0 - 36.0 g/dL   RDW 78.2  95.6 - 21.3 %   Platelets 159  150 - 400 K/uL   Neutrophils Relative % 72  43 - 77 %   Neutro Abs 3.9  1.7 - 7.7 K/uL   Lymphocytes Relative 19  12 - 46 %   Lymphs Abs 1.0  0.7 - 4.0 K/uL   Monocytes Relative 7  3 - 12 %   Monocytes Absolute 0.4  0.1 - 1.0 K/uL   Eosinophils Relative 2  0 - 5 %   Eosinophils Absolute 0.1  0.0 - 0.7 K/uL   Basophils Relative 1  0 - 1 %   Basophils Absolute 0.0  0.0 - 0.1 K/uL  BASIC METABOLIC PANEL     Status: Abnormal   Collection Time    10/05/13 12:25 PM      Result Value Range   Sodium 139  135 - 145 mEq/L   Potassium 5.2 (*) 3.5 - 5.1 mEq/L   Chloride 105  96 - 112 mEq/L   CO2 26  19 - 32 mEq/L   Glucose, Bld 121 (*) 70 - 99 mg/dL   BUN 35 (*) 6 - 23 mg/dL  Creatinine, Ser 2.22 (*) 0.50 - 1.35 mg/dL   Calcium 9.3  8.4 - 40.9 mg/dL   GFR calc non Af Amer 24 (*) >90 mL/min   GFR calc Af Amer 28 (*) >90 mL/min   Comment: (NOTE)     The eGFR has been calculated using the CKD EPI equation.     This calculation has not been validated in all clinical situations.     eGFR's persistently <90 mL/min signify possible Chronic Kidney     Disease.  TROPONIN I     Status: None   Collection Time    10/05/13 12:25 PM      Result Value Range   Troponin I <0.30  <0.30 ng/mL   Comment:            Due to the release kinetics of cTnI,     a negative result within the first hours     of the onset of symptoms does not rule out     myocardial infarction with certainty.     If myocardial infarction is still suspected,     repeat the test at appropriate intervals.  PRO B NATRIURETIC PEPTIDE     Status: Abnormal   Collection Time    10/05/13  1:44 PM      Result Value Range   Pro B Natriuretic peptide (BNP) 3785.0 (*) 0 - 450 pg/mL  URINALYSIS, ROUTINE W REFLEX MICROSCOPIC     Status: Abnormal   Collection Time    10/05/13  2:17 PM      Result Value Range   Color, Urine YELLOW  YELLOW    APPearance CLEAR  CLEAR   Specific Gravity, Urine 1.025  1.005 - 1.030   pH 7.0  5.0 - 8.0   Glucose, UA NEGATIVE  NEGATIVE mg/dL   Hgb urine dipstick NEGATIVE  NEGATIVE   Bilirubin Urine NEGATIVE  NEGATIVE   Ketones, ur NEGATIVE  NEGATIVE mg/dL   Protein, ur 811 (*) NEGATIVE mg/dL   Urobilinogen, UA 0.2  0.0 - 1.0 mg/dL   Nitrite NEGATIVE  NEGATIVE   Leukocytes, UA NEGATIVE  NEGATIVE  URINE MICROSCOPIC-ADD ON     Status: None   Collection Time    10/05/13  2:17 PM      Result Value Range   Squamous Epithelial / LPF RARE  RARE   WBC, UA 0-2  <3 WBC/hpf   Bacteria, UA RARE  RARE  URINALYSIS W MICROSCOPIC + REFLEX CULTURE     Status: Abnormal   Collection Time    10/05/13  6:23 PM      Result Value Range   Color, Urine YELLOW  YELLOW   APPearance CLEAR  CLEAR   Specific Gravity, Urine 1.025  1.005 - 1.030   pH 6.0  5.0 - 8.0   Glucose, UA NEGATIVE  NEGATIVE mg/dL   Hgb urine dipstick NEGATIVE  NEGATIVE   Bilirubin Urine NEGATIVE  NEGATIVE   Ketones, ur NEGATIVE  NEGATIVE mg/dL   Protein, ur 914 (*) NEGATIVE mg/dL   Urobilinogen, UA 0.2  0.0 - 1.0 mg/dL   Nitrite NEGATIVE  NEGATIVE   Leukocytes, UA NEGATIVE  NEGATIVE   WBC, UA 0-2  <3 WBC/hpf   Squamous Epithelial / LPF RARE  RARE   Casts HYALINE CASTS (*) NEGATIVE  TROPONIN I     Status: None   Collection Time    10/05/13  6:46 PM      Result Value Range   Troponin I <0.30  <0.30  ng/mL   Comment:            Due to the release kinetics of cTnI,     a negative result within the first hours     of the onset of symptoms does not rule out     myocardial infarction with certainty.     If myocardial infarction is still suspected,     repeat the test at appropriate intervals.  TROPONIN I     Status: None   Collection Time    10/06/13 12:43 AM      Result Value Range   Troponin I <0.30  <0.30 ng/mL   Comment:            Due to the release kinetics of cTnI,     a negative result within the first hours     of the onset of  symptoms does not rule out     myocardial infarction with certainty.     If myocardial infarction is still suspected,     repeat the test at appropriate intervals.  HEMOGLOBIN A1C     Status: Abnormal   Collection Time    10/06/13  5:31 AM      Result Value Range   Hemoglobin A1C 6.0 (*) <5.7 %   Comment: (NOTE)                                                                               According to the ADA Clinical Practice Recommendations for 2011, when     HbA1c is used as a screening test:      >=6.5%   Diagnostic of Diabetes Mellitus               (if abnormal result is confirmed)     5.7-6.4%   Increased risk of developing Diabetes Mellitus     References:Diagnosis and Classification of Diabetes Mellitus,Diabetes     Care,2011,34(Suppl 1):S62-S69 and Standards of Medical Care in             Diabetes - 2011,Diabetes Care,2011,34 (Suppl 1):S11-S61.   Mean Plasma Glucose 126 (*) <117 mg/dL   Comment: Performed at [facility]  LIPID PANEL     Status: Abnormal   Collection Time    10/06/13  5:31 AM      Result Value Range   Cholesterol 185  0 - 200 mg/dL   Triglycerides 64  <161 mg/dL   HDL 38 (*) >09 mg/dL   Total CHOL/HDL Ratio 4.9     VLDL 13  0 - 40 mg/dL   LDL Cholesterol 604 (*) 0 - 99 mg/dL   Comment:            Total Cholesterol/HDL:CHD Risk     Coronary Heart Disease Risk Table                         Men   Women      1/2 Average Risk   3.4   3.3      Average Risk       5.0   4.4      2 X Average Risk   9.6  7.1      3 X Average Risk  23.4   11.0                Use the calculated Patient Ratio     above and the CHD Risk Table     to determine the patient's CHD Risk.                ATP III CLASSIFICATION (LDL):      <100     mg/dL   Optimal      161-096  mg/dL   Near or Above                        Optimal      130-159  mg/dL   Borderline      045-409  mg/dL   High      >811     mg/dL   Very High  BASIC METABOLIC PANEL     Status: Abnormal    Collection Time    10/06/13  5:31 AM      Result Value Range   Sodium 139  135 - 145 mEq/L   Potassium 4.3  3.5 - 5.1 mEq/L   Comment: DELTA CHECK NOTED   Chloride 105  96 - 112 mEq/L   CO2 22  19 - 32 mEq/L   Glucose, Bld 91  70 - 99 mg/dL   BUN 33 (*) 6 - 23 mg/dL   Creatinine, Ser 9.14 (*) 0.50 - 1.35 mg/dL   Calcium 8.9  8.4 - 78.2 mg/dL   GFR calc non Af Amer 26 (*) >90 mL/min   GFR calc Af Amer 30 (*) >90 mL/min   Comment: (NOTE)     The eGFR has been calculated using the CKD EPI equation.     This calculation has not been validated in all clinical situations.     eGFR's persistently <90 mL/min signify possible Chronic Kidney     Disease.  CBC     Status: Abnormal   Collection Time    10/06/13  5:31 AM      Result Value Range   WBC 5.8  4.0 - 10.5 K/uL   RBC 3.80 (*) 4.22 - 5.81 MIL/uL   Hemoglobin 11.7 (*) 13.0 - 17.0 g/dL   HCT 95.6 (*) 21.3 - 08.6 %   MCV 93.7  78.0 - 100.0 fL   MCH 30.8  26.0 - 34.0 pg   MCHC 32.9  30.0 - 36.0 g/dL   RDW 57.8  46.9 - 62.9 %   Platelets 171  150 - 400 K/uL  TROPONIN I     Status: None   Collection Time    10/06/13  5:31 AM      Result Value Range   Troponin I <0.30  <0.30 ng/mL   Comment:            Due to the release kinetics of cTnI,     a negative result within the first hours     of the onset of symptoms does not rule out     myocardial infarction with certainty.     If myocardial infarction is still suspected,     repeat the test at appropriate intervals.  BASIC METABOLIC PANEL     Status: Abnormal   Collection Time    10/07/13  7:46 AM      Result Value Range   Sodium 137  135 - 145 mEq/L   Potassium  4.6  3.5 - 5.1 mEq/L   Chloride 105  96 - 112 mEq/L   CO2 24  19 - 32 mEq/L   Glucose, Bld 92  70 - 99 mg/dL   BUN 33 (*) 6 - 23 mg/dL   Creatinine, Ser 1.61 (*) 0.50 - 1.35 mg/dL   Calcium 9.0  8.4 - 09.6 mg/dL   GFR calc non Af Amer 24 (*) >90 mL/min   GFR calc Af Amer 28 (*) >90 mL/min   Comment: (NOTE)     The  eGFR has been calculated using the CKD EPI equation.     This calculation has not been validated in all clinical situations.     eGFR's persistently <90 mL/min signify possible Chronic Kidney     Disease.    Lipid Panel  Recent Labs  10/06/13 0531  CHOL 185  TRIG 64  HDL 38*  CHOLHDL 4.9  VLDL 13  LDLCALC 045*    Studies/Results: Ct Head Wo Contrast  10/05/2013   CLINICAL DATA:  Dizziness, weakness  EXAM: CT HEAD WITHOUT CONTRAST  TECHNIQUE: Contiguous axial images were obtained from the base of the skull through the vertex without intravenous contrast.  COMPARISON:  08/03/2011  FINDINGS: No skull fracture is noted. No intracranial hemorrhage, mass effect or midline shift. There is mucosal thickening left maxillary sinus. The mastoid air cells are unremarkable. Stable cerebral atrophy. Periventricular and patchy subcortical white matter decreased attenuation consistent with chronic small vessel ischemic changes. Again noted encephalomalacia due to old infarcts bilateral frontal lobe, right parietal lobe. There is interval lacunar infarct in right basal ganglia of indeterminate age. No acute cortical infarction. No mass lesion is noted on this unenhanced scan.  IMPRESSION: No acute intracranial abnormality. Stable atrophy and chronic white matter disease.Again noted encephalomalacia due to old infarcts bilateral frontal lobe, right parietal lobe. There is interval lacunar infarct in right basal ganglia of indeterminate age. No acute cortical infarction.   Electronically Signed   By: Natasha Mead M.D.   On: 10/05/2013 12:59   US Carotid Duplex Bilateral  10/05/2013   CLINICAL DATA:  Dizziness; evidence of prior CVA  EXAM: BILATERAL CAROTID DUPLEX ULTRASOUND  TECHNIQUE: Wallace Cullens scale imaging, color Doppler and duplex ultrasound were performed of bilateral carotid and vertebral arteries in the neck.  COMPARISON:  August 04, 2011  FINDINGS: Criteria: Quantification of carotid stenosis is based on  velocity parameters that correlate the residual internal carotid diameter with NASCET-based stenosis levels, using the diameter of the distal internal carotid lumen as the denominator for stenosis measurement.  The following velocity measurements were obtained:  RIGHT  ICA:  75/20 cm/sec  CCA:  47/8 cm/sec  SYSTOLIC ICA/CCA RATIO:  1.6  DIASTOLIC ICA/CCA RATIO:  2.6  ECA:  112 cm/sec peak systolic  LEFT  ICA:  83/26 cm/sec  CCA:  48/8 cm/sec  SYSTOLIC ICA/CCA RATIO:  1.7  DIASTOLIC ICA/CCA RATIO:  3.2  ECA:  108  cm/sec peak systolic  RIGHT CAROTID ARTERY: There is diffuse intimal thickening. There is irregular heterogeneous echogenicity plaque throughout the bifurcation and proximal right internal carotid artery. There are areas of aliasing with a color flow suggesting hemodynamically significant obstruction. By real-time interrogation, there appears to be approximately 70% diameter stenosis in the proximal right internal carotid artery.  RIGHT VERTEBRAL ARTERY:  Flow is antegrade.  LEFT CAROTID ARTERY: There is extensive plaque throughout the bifurcation and proximal internal carotid artery. This plaque is heterogeneous in echotexture. Areas of color flow a aliasing  are seen in this vessel. There are multiple areas of greater than 50% diameter stenosis in the bifurcation and proximal internal carotid artery. There is an area of that appears to represent approximately 85% diameter stenosis slightly beyond the bifurcation in the proximal left internal carotid artery.  LEFT VERTEBRAL ARTERY:  Nonvisualized.  IMPRESSION: By real-time interrogation, there appears to be hemodynamically significant obstruction in both proximal internal carotid arteries, somewhat more severe on the left than on the right. Color flow Doppler aliasing suggests that there is indeed hemodynamically significant stenosis in these vessels. The peak systolic velocities do not indicate obstruction of this degree; there reason for this discrepancy  is frankly uncertain. The  Flow in the right vertebral artery is in the anatomic direction. The left vertebral artery could not be visualized.  Given the discrepancy between real-time interrogation an velocity measurements as well as nonvisualization of the left vertebral artery, correlation with CT or MR angiography of the extracranial carotid and vertebral circulation is felt to be advisable.   Electronically Signed   By: Bretta Bang M.D.   On: 10/05/2013 18:39   Dg Chest Port 1 View  10/05/2013   CLINICAL DATA:  Dizziness and hypertension ; COPD  EXAM: PORTABLE CHEST - 1 VIEW  COMPARISON:  November 11, 2011  FINDINGS: There is underlying emphysematous change. There is mild scarring in the bases. There is no edema or consolidation. Heart is enlarged but stable. Pulmonary vascularity reflects underlying emphysema. Aorta is prominent with atherosclerotic change, stable. Pacemaker leads are attached to the right atrium and right ventricle. No pneumothorax. No adenopathy. No bone lesions.  IMPRESSION: Underlying emphysema. No edema or consolidation. Stable cardiomegaly. Stable prominence of the thoracic aorta with atherosclerotic change. Question hypertensive change.   Electronically Signed   By: Bretta Bang M.D.   On: 10/05/2013 14:09    Medications:  Scheduled Meds: . atorvastatin  20 mg Oral q1800  . cholecalciferol  2,000 Units Oral Daily  . diltiazem  180 mg Oral Daily  . dipyridamole-aspirin  1 capsule Oral Daily  . doxazosin  1 mg Oral QHS  . enoxaparin (LOVENOX) injection  30 mg Subcutaneous Q24H  . ferrous sulfate  325 mg Oral Daily  . hydrALAZINE  25 mg Oral Once  . hydrALAZINE  50 mg Oral Q8H  . isosorbide mononitrate  15 mg Oral Daily  . metoprolol  50 mg Oral BID  . pantoprazole  80 mg Oral Daily  . vitamin B-12  500 mcg Oral Daily   Continuous Infusions:  PRN Meds:.hydrALAZINE, nitroGLYCERIN     ECHO  - Comparison to prior study October 2012. Moderate to  severe LVH with LVEF 60-65% and grade 1 diastolic dysfunction. Moderate left atrial and mild right atrial enlargement. Moderate MAC with trivial mitral regurgitation. Mild to moderate aortic stenosis as outlined above. Pacer wire visualized in right heart. Trivial tricuspid regurgitation with PASP 34 mm mercury.      LOS: 2 days   Tyke Outman A. Gerilyn Pilgrim, M.D.  Diplomate, Biomedical engineer of Psychiatry and Neurology ( Neurology).

## 2013-10-11 ENCOUNTER — Ambulatory Visit (INDEPENDENT_AMBULATORY_CARE_PROVIDER_SITE_OTHER): Payer: Medicare Other | Admitting: Cardiovascular Disease

## 2013-10-11 ENCOUNTER — Encounter: Payer: Self-pay | Admitting: Cardiovascular Disease

## 2013-10-11 VITALS — BP 159/77 | HR 70 | Resp 16 | Ht 70.5 in | Wt 186.6 lb

## 2013-10-11 DIAGNOSIS — I2510 Atherosclerotic heart disease of native coronary artery without angina pectoris: Secondary | ICD-10-CM

## 2013-10-11 DIAGNOSIS — I714 Abdominal aortic aneurysm, without rupture, unspecified: Secondary | ICD-10-CM

## 2013-10-11 DIAGNOSIS — I495 Sick sinus syndrome: Secondary | ICD-10-CM

## 2013-10-11 DIAGNOSIS — I10 Essential (primary) hypertension: Secondary | ICD-10-CM

## 2013-10-11 DIAGNOSIS — I639 Cerebral infarction, unspecified: Secondary | ICD-10-CM

## 2013-10-11 DIAGNOSIS — I509 Heart failure, unspecified: Secondary | ICD-10-CM

## 2013-10-11 DIAGNOSIS — I6350 Cerebral infarction due to unspecified occlusion or stenosis of unspecified cerebral artery: Secondary | ICD-10-CM

## 2013-10-11 DIAGNOSIS — I5032 Chronic diastolic (congestive) heart failure: Secondary | ICD-10-CM

## 2013-10-11 DIAGNOSIS — Z950 Presence of cardiac pacemaker: Secondary | ICD-10-CM

## 2013-10-11 DIAGNOSIS — N184 Chronic kidney disease, stage 4 (severe): Secondary | ICD-10-CM

## 2013-10-11 DIAGNOSIS — E785 Hyperlipidemia, unspecified: Secondary | ICD-10-CM

## 2013-10-11 LAB — PACEMAKER DEVICE OBSERVATION

## 2013-10-11 NOTE — Patient Instructions (Signed)
Remote monitoring is used to monitor your pacemaker from home. This monitoring reduces the number of office visits required to check your device to one time per year. It allows [place] to keep an eye on the functioning of your device to ensure it is working properly. You are scheduled for a device check from home on 01-12-2014. You may send your transmission at any time that day. If you have a wireless device, the transmission will be sent automatically. After your physician reviews your transmission, you will receive a postcard with your next transmission date.  Your physician recommends that you schedule a follow-up appointment in: 6 months + pacemaker check

## 2013-10-15 LAB — MDC_IDC_ENUM_SESS_TYPE_INCLINIC
Battery Impedance: 304 Ohm
Battery Voltage: 2.79 V
Brady Statistic AP VP Percent: 7.5 %
Brady Statistic AP VS Percent: 83.5 %
Brady Statistic AS VP Percent: 0.4 %
Brady Statistic AS VS Percent: 8.6 %
Lead Channel Impedance Value: 499 Ohm
Lead Channel Pacing Threshold Amplitude: 0.5 V
Lead Channel Pacing Threshold Amplitude: 0.75 V
Lead Channel Pacing Threshold Pulse Width: 0.4 ms
Lead Channel Sensing Intrinsic Amplitude: 2.8 mV
Lead Channel Sensing Intrinsic Amplitude: 22.4 mV
Lead Channel Setting Pacing Amplitude: 1.5 V
Lead Channel Setting Pacing Amplitude: 2 V
Lead Channel Setting Sensing Sensitivity: 5.6 mV

## 2013-10-19 DIAGNOSIS — E785 Hyperlipidemia, unspecified: Secondary | ICD-10-CM | POA: Insufficient documentation

## 2013-10-19 DIAGNOSIS — Z950 Presence of cardiac pacemaker: Secondary | ICD-10-CM | POA: Insufficient documentation

## 2013-10-19 NOTE — Progress Notes (Signed)
Patient ID: [name], male   DOB: 1919/01/30, 79 y.o.   MRN: [id number]      Reason for office visit Followup after recent hospitalization, diastolic heart failure, coronary artery disease, abdominal aortic aneurysm  Gen. Koska was recently hospitalized for what appears to be an extension of his old stroke. He was unable to get out of bed and slid out of bed onto the floor without really falling. Initially a few to go to the emergency room, relenting only several hours later. He was unsteady on his feet leaning towards his left side and had left facial droop worse than usual. His blood pressure was extremely high. Because of his pacemaker he was unable to undergo an MRI but it was suspected that he had a new subcortical lacunar infarct an extension of his old right CVA as a cause of his symptoms.  Aspirin was stopped he was started on Aggrenox. Note that he has a history of allergic rash to Plavix. He had a repeat echocardiogram that again confirmed normal left ventricle systolic function with mild to moderate valvular aortic stenosis. Duplex ultrasonography shows a 70% proximal right internal carotid stenosis and an 85% left internal carotid artery stenosis. He has chronic kidney disease that is fairly advanced and did not want to undergo angiography and risk acute renal failure. He does not want to undergo carotid surgery. He has opted for medical therapy. Neurologically, he is now pretty much back to baseline. Interrogation of his pacemaker shows no evidence of atrial fibrillation around the time of these events.   Allergies  Allergen Reactions  . Lasix [Furosemide] Itching    Scalp broke out  . Bystolic [Nebivolol Hcl] Rash  . Plavix [Clopidogrel Bisulfate] Rash    Current Outpatient Prescriptions  Medication Sig Dispense Refill  . atorvastatin (LIPITOR) 20 MG tablet Take 1 tablet (20 mg total) by mouth daily at 6 PM.  30 tablet  1  . Cholecalciferol (VITAMIN D) 2000 UNITS tablet  Take 2,000 Units by mouth daily.      Marland Kitchen diltiazem (TIAZAC) 180 MG 24 hr capsule Take 180 mg by mouth daily.      Marland Kitchen dipyridamole-aspirin (AGGRENOX) 200-25 MG per 12 hr capsule Take 1 capsule by mouth 2 (two) times daily.  60 capsule  1  . doxazosin (CARDURA) 1 MG tablet Take 1 mg by mouth at bedtime.      . ferrous sulfate 325 (65 FE) MG tablet Take 325 mg by mouth daily.      . fluticasone (FLONASE) 50 MCG/ACT nasal spray Place 2 sprays into the nose daily as needed. For allergies      . hydrALAZINE (APRESOLINE) 25 MG tablet Take 1 tablet (25 mg total) by mouth every 12 (twelve) hours.  60 tablet  1  . isosorbide mononitrate (IMDUR) 15 mg TB24 24 hr tablet Take 0.5 tablets (15 mg total) by mouth daily.  30 tablet  1  . metoprolol (LOPRESSOR) 50 MG tablet Take 50 mg by mouth 2 (two) times daily.      Marland Kitchen omeprazole (PRILOSEC) 40 MG capsule Take 40 mg by mouth daily.      . Saw Palmetto 450 MG CAPS Take 1 capsule by mouth daily.      Marland Kitchen torsemide (DEMADEX) 20 MG tablet Take 20 mg by mouth daily as needed (swelling).      . vitamin B-12 (CYANOCOBALAMIN) 500 MCG tablet Take 500 mcg by mouth daily.       No current facility-administered medications  for this visit.    Past Medical History  Diagnosis Date  . Coronary artery disease   . Hypertension   . Pacemaker 05/08/2009    Medtronic  . CKD (chronic kidney disease), stage III   . Complete heart block   . GERD (gastroesophageal reflux disease)   . Diastolic heart failure   . Myocardial infarction 2010  . Stroke 1998, 2005, 2009  . Shingles   . CHF (congestive heart failure)     diastolic  . COPD (chronic obstructive pulmonary disease)   . Arthritis   . Left-sided weakness   . Peripheral edema   . AAA (abdominal aortic aneurysm)     infrarenal 03/12/12- 3.7 x 3.5 cm    Past Surgical History  Procedure Laterality Date  . Eye surgery      tear duct probing  . Permanent pacemaker insertion  05/08/2009    Medtronic  . Hernia repair  10  yrs ago    [facility]  . Cataract extraction w/phaco  02/03/2012    Procedure: CATARACT EXTRACTION PHACO AND INTRAOCULAR LENS PLACEMENT (IOC);  Surgeon: Loraine Leriche T. Nile Engelstad, MD;  Location: [facility];  Service: Ophthalmology;  Laterality: Left;  CDE=29.12  . Cataract extraction w/phaco  03/23/2012    Procedure: CATARACT EXTRACTION PHACO AND INTRAOCULAR LENS PLACEMENT (IOC);  Surgeon: Loraine Leriche T. Nile Tackitt, MD;  Location: [facility];  Service: Ophthalmology;  Laterality: Right;  CDE 22.98  . Nm myocar perf wall motion  06/13/2009    mild to mod ischemia basal inferior & mid inferior regions    Family History  Problem Relation Age of Onset  . Anesthesia problems Neg Hx   . Hypotension Neg Hx   . Malignant hyperthermia Neg Hx   . Pseudochol deficiency Neg Hx     History   Social History  . Marital Status: Married    Spouse Name: N/A    Number of Children: N/A  . Years of Education: N/A   Occupational History  . Not on file.   Social History Main Topics  . Smoking status: Never Smoker   . Smokeless tobacco: Not on file  . Alcohol Use: Yes     Comment: once a week  . Drug Use: No  . Sexual Activity: Yes    Birth Control/ Protection: None   Other Topics Concern  . Not on file   Social History Narrative  . No narrative on file    Review of systems: The patient specifically denies any chest pain at rest or with exertion, dyspnea at rest or with exertion, orthopnea, paroxysmal nocturnal dyspnea, syncope, palpitations,  intermittent claudication, unexplained weight gain, cough, hemoptysis or wheezing. He has occasional ankle edema  The patient also denies abdominal pain, nausea, vomiting, dysphagia, diarrhea, constipation, polyuria, polydipsia, dysuria, hematuria, frequency, urgency, abnormal bleeding or bruising, fever, chills, unexpected weight changes, mood swings, change in skin or hair texture, change in voice quality, auditory or visual problems, allergic reactions or rashes, new  musculoskeletal complaints other than usual "aches and pains".   PHYSICAL EXAM BP 159/77  Pulse 70  Resp 16  Ht 5' 10.5" (1.791 m)  Wt 186 lb 9.6 oz (84.641 kg)  BMI 26.39 kg/m2 He states at home his blood pressure today was 113/54 mm Hg.  General: Alert, oriented x3, no distress Head: no evidence of trauma, PERRL, EOMI, no exophtalmos or lid lag, no myxedema, no xanthelasma; normal ears, nose and oropharynx Mild left facial droop Neck: normal jugular venous pulsations and no hepatojugular reflux;  brisk carotid pulses without delay and no carotid bruits Chest: clear to auscultation, no signs of consolidation by percussion or palpation, normal fremitus, symmetrical and full respiratory excursions Cardiovascular: normal position and quality of the apical impulse, regular rhythm, normal first and paradoxically split second heart sounds, no  rubs or gallops, early peaking 2/6 systolic ejection murmur in the aortic focus Abdomen: no tenderness or distention, no masses by palpation, no abnormal pulsatility or arterial bruits, normal bowel sounds, no hepatosplenomegaly Extremities: no clubbing, cyanosis or edema; 2+ radial, ulnar and brachial pulses bilaterally; 2+ right femoral, posterior tibial and dorsalis pedis pulses; 2+ left femoral, posterior tibial and dorsalis pedis pulses; no subclavian or femoral bruits Neurological: 4/5 left upper and lower extremity weakness  EKG: AV sequential pacing in the hospital  Device interrogation does show a few episodes of mode switch but these are all under 30 seconds in duration and with one exception did not show high ventricular rates. One 8 beat run of nonsustained ventricular tachycardia has been recorded.  Lipid Panel     Component Value Date/Time   CHOL 185 10/06/2013 0531   TRIG 64 10/06/2013 0531   HDL 38* 10/06/2013 0531   CHOLHDL 4.9 10/06/2013 0531   VLDL 13 10/06/2013 0531   LDLCALC 134* 10/06/2013 0531    BMET    Component Value  Date/Time   NA 137 10/07/2013 0746   K 4.6 10/07/2013 0746   CL 105 10/07/2013 0746   CO2 24 10/07/2013 0746   GLUCOSE 92 10/07/2013 0746   BUN 33* 10/07/2013 0746   CREATININE 2.22* 10/07/2013 0746   CALCIUM 9.0 10/07/2013 0746   GFRNONAA 24* 10/07/2013 0746   GFRAA 28* 10/07/2013 0746     ASSESSMENT AND PLAN CVA (cerebral infarction) Echocardiography and pacemaker check did not support a cardioembolic source of his symptoms. He does have extensive carotid atherosclerosis but prefers medical management. He is now on Aggrenox.  CAD (coronary artery disease) Nuclear stress testing in 2010 showed mild to moderate basal and mid inferior wall ischemia with preserved systolic function and regional wall motion. For the reasons described above he wants to avoid angiography and does not want invasive evaluation. He does not have angina pectoris  AAA (abdominal aortic aneurysm) Last assessed by ultrasonography in April of 2012 with a maximum diameter of 3.7 cm. Serial follow up does not appear to be justified if he does not want to undergo surgery or stent graft repair  Unspecified essential hypertension His blood pressure is slightly high in the office today but it did not make any changes to his medications since his home his blood pressure was normal. He stands to have serious complications both from high blood pressure and low blood pressure.  Chronic diastolic CHF (congestive heart failure) Are no signs of hypervolemia or heart failure decompensation at this time   CKD (chronic kidney disease) stage 4, GFR 15-29 ml/min    Hyperlipidemia Together with antihypertensive and antiplatelet therapy, statin therapy is the best way to prevent new cerebral ischemia or progression of his suspected coronary problems and enlargement of his AAA.  Pacemaker - Medtronic adapta dual-chamber implanted 2010 He is not pacemaker dependent. His device is not MRI compatible. Pacemaker check in clinic.  Normal device function. Thresholds, sensing, impedances consistent with previous measurements.  30 mode switches(<0.1%---all <30 sec, no EGMs. 1 high ventricular rates noted X 8 bts,  60/163---NSVT.  Roughly 80% atrial pacing and only 8% ventricular pacing Device programmed at appropriate safety margins. Histogram  distribution appropriate for patient activity level. Device programmed to optimize intrinsic conduction. Estimated longevity 10 years. Patient will follow up remotely on 01-12-2014 and with MD in 6 months.    Orders Placed This Encounter  Procedures  . Implantable device check   Khyler Eschmann  [name], MD, [facility] [phone] office [phone] pager

## 2013-10-19 NOTE — Assessment & Plan Note (Signed)
Nuclear stress testing in 2010 showed mild to moderate basal and mid inferior wall ischemia with preserved systolic function and regional wall motion. For the reasons described above he wants to avoid angiography and does not want invasive evaluation. He does not have angina pectoris

## 2013-10-19 NOTE — Assessment & Plan Note (Signed)
Together with antihypertensive and antiplatelet therapy, statin therapy is the best way to prevent new cerebral ischemia or progression of his suspected coronary problems and enlargement of his AAA.

## 2013-10-19 NOTE — Assessment & Plan Note (Signed)
His blood pressure is slightly high in the office today but it did not make any changes to his medications since his home his blood pressure was normal. He stands to have serious complications both from high blood pressure and low blood pressure.

## 2013-10-19 NOTE — Assessment & Plan Note (Signed)
Last assessed by ultrasonography in April of 2012 with a maximum diameter of 3.7 cm. Serial follow up does not appear to be justified if he does not want to undergo surgery or stent graft repair

## 2013-10-19 NOTE — Assessment & Plan Note (Signed)
Are no signs of hypervolemia or heart failure decompensation at this time

## 2013-10-19 NOTE — Assessment & Plan Note (Signed)
He is not pacemaker dependent. His device is not MRI compatible. Pacemaker check in clinic. Normal device function. Thresholds, sensing, impedances consistent with previous measurements.  30 mode switches(<0.1%---all <30 sec, no EGMs. 1 high ventricular rates noted X 8 bts,  60/163---NSVT.  Roughly 80% atrial pacing and only 8% ventricular pacing Device programmed at appropriate safety margins. Histogram distribution appropriate for patient activity level. Device programmed to optimize intrinsic conduction. Estimated longevity 10 years. Patient will follow up remotely on 01-12-2014 and with MD in 6 months.

## 2013-10-19 NOTE — Assessment & Plan Note (Signed)
Echocardiography and pacemaker check did not support a cardioembolic source of his symptoms. He does have extensive carotid atherosclerosis but prefers medical management. He is now on Aggrenox.

## 2013-11-07 ENCOUNTER — Inpatient Hospital Stay (HOSPITAL_COMMUNITY): Payer: Medicare Other

## 2013-11-07 ENCOUNTER — Inpatient Hospital Stay (HOSPITAL_COMMUNITY)
Admission: EM | Admit: 2013-11-07 | Discharge: 2013-11-22 | DRG: 208 | Disposition: A | Discharge status: Skilled Nursing Facility | Payer: Medicare Other | Attending: Internal Medicine | Admitting: Internal Medicine

## 2013-11-07 ENCOUNTER — Emergency Department (HOSPITAL_COMMUNITY): Payer: Medicare Other

## 2013-11-07 ENCOUNTER — Encounter (HOSPITAL_COMMUNITY): Payer: Self-pay | Admitting: Emergency Medicine

## 2013-11-07 ENCOUNTER — Encounter: Payer: Medicare Other | Admitting: Cardiovascular Disease

## 2013-11-07 DIAGNOSIS — E119 Type 2 diabetes mellitus without complications: Secondary | ICD-10-CM | POA: Diagnosis present

## 2013-11-07 DIAGNOSIS — I359 Nonrheumatic aortic valve disorder, unspecified: Secondary | ICD-10-CM

## 2013-11-07 DIAGNOSIS — R29898 Other symptoms and signs involving the musculoskeletal system: Secondary | ICD-10-CM

## 2013-11-07 DIAGNOSIS — E860 Dehydration: Secondary | ICD-10-CM

## 2013-11-07 DIAGNOSIS — Z66 Do not resuscitate: Secondary | ICD-10-CM | POA: Diagnosis not present

## 2013-11-07 DIAGNOSIS — I495 Sick sinus syndrome: Secondary | ICD-10-CM | POA: Diagnosis present

## 2013-11-07 DIAGNOSIS — T502X5A Adverse effect of carbonic-anhydrase inhibitors, benzothiadiazides and other diuretics, initial encounter: Secondary | ICD-10-CM | POA: Diagnosis present

## 2013-11-07 DIAGNOSIS — I10 Essential (primary) hypertension: Secondary | ICD-10-CM

## 2013-11-07 DIAGNOSIS — I5031 Acute diastolic (congestive) heart failure: Secondary | ICD-10-CM

## 2013-11-07 DIAGNOSIS — I714 Abdominal aortic aneurysm, without rupture, unspecified: Secondary | ICD-10-CM

## 2013-11-07 DIAGNOSIS — N183 Chronic kidney disease, stage 3 unspecified: Secondary | ICD-10-CM

## 2013-11-07 DIAGNOSIS — I252 Old myocardial infarction: Secondary | ICD-10-CM

## 2013-11-07 DIAGNOSIS — I639 Cerebral infarction, unspecified: Secondary | ICD-10-CM

## 2013-11-07 DIAGNOSIS — J9600 Acute respiratory failure, unspecified whether with hypoxia or hypercapnia: Secondary | ICD-10-CM

## 2013-11-07 DIAGNOSIS — R531 Weakness: Secondary | ICD-10-CM

## 2013-11-07 DIAGNOSIS — R1310 Dysphagia, unspecified: Secondary | ICD-10-CM | POA: Diagnosis present

## 2013-11-07 DIAGNOSIS — Z8673 Personal history of transient ischemic attack (TIA), and cerebral infarction without residual deficits: Secondary | ICD-10-CM

## 2013-11-07 DIAGNOSIS — R918 Other nonspecific abnormal finding of lung field: Secondary | ICD-10-CM | POA: Diagnosis present

## 2013-11-07 DIAGNOSIS — K219 Gastro-esophageal reflux disease without esophagitis: Secondary | ICD-10-CM | POA: Diagnosis present

## 2013-11-07 DIAGNOSIS — R5383 Other fatigue: Secondary | ICD-10-CM

## 2013-11-07 DIAGNOSIS — I219 Acute myocardial infarction, unspecified: Secondary | ICD-10-CM

## 2013-11-07 DIAGNOSIS — R32 Unspecified urinary incontinence: Secondary | ICD-10-CM | POA: Diagnosis not present

## 2013-11-07 DIAGNOSIS — E871 Hypo-osmolality and hyponatremia: Secondary | ICD-10-CM | POA: Diagnosis not present

## 2013-11-07 DIAGNOSIS — I5032 Chronic diastolic (congestive) heart failure: Secondary | ICD-10-CM

## 2013-11-07 DIAGNOSIS — N184 Chronic kidney disease, stage 4 (severe): Secondary | ICD-10-CM

## 2013-11-07 DIAGNOSIS — I69959 Hemiplegia and hemiparesis following unspecified cerebrovascular disease affecting unspecified side: Secondary | ICD-10-CM

## 2013-11-07 DIAGNOSIS — Z79899 Other long term (current) drug therapy: Secondary | ICD-10-CM

## 2013-11-07 DIAGNOSIS — I214 Non-ST elevation (NSTEMI) myocardial infarction: Secondary | ICD-10-CM | POA: Diagnosis present

## 2013-11-07 DIAGNOSIS — K120 Recurrent oral aphthae: Secondary | ICD-10-CM

## 2013-11-07 DIAGNOSIS — I4891 Unspecified atrial fibrillation: Secondary | ICD-10-CM | POA: Diagnosis present

## 2013-11-07 DIAGNOSIS — J690 Pneumonitis due to inhalation of food and vomit: Principal | ICD-10-CM | POA: Diagnosis present

## 2013-11-07 DIAGNOSIS — E785 Hyperlipidemia, unspecified: Secondary | ICD-10-CM

## 2013-11-07 DIAGNOSIS — R0603 Acute respiratory distress: Secondary | ICD-10-CM

## 2013-11-07 DIAGNOSIS — D649 Anemia, unspecified: Secondary | ICD-10-CM

## 2013-11-07 DIAGNOSIS — I5030 Unspecified diastolic (congestive) heart failure: Secondary | ICD-10-CM

## 2013-11-07 DIAGNOSIS — I2510 Atherosclerotic heart disease of native coronary artery without angina pectoris: Secondary | ICD-10-CM

## 2013-11-07 DIAGNOSIS — G8190 Hemiplegia, unspecified affecting unspecified side: Secondary | ICD-10-CM | POA: Diagnosis not present

## 2013-11-07 DIAGNOSIS — J449 Chronic obstructive pulmonary disease, unspecified: Secondary | ICD-10-CM | POA: Diagnosis present

## 2013-11-07 DIAGNOSIS — Z950 Presence of cardiac pacemaker: Secondary | ICD-10-CM

## 2013-11-07 DIAGNOSIS — K5900 Constipation, unspecified: Secondary | ICD-10-CM | POA: Diagnosis present

## 2013-11-07 DIAGNOSIS — D62 Acute posthemorrhagic anemia: Secondary | ICD-10-CM | POA: Diagnosis not present

## 2013-11-07 DIAGNOSIS — N189 Chronic kidney disease, unspecified: Secondary | ICD-10-CM

## 2013-11-07 DIAGNOSIS — J4489 Other specified chronic obstructive pulmonary disease: Secondary | ICD-10-CM | POA: Diagnosis present

## 2013-11-07 DIAGNOSIS — I6529 Occlusion and stenosis of unspecified carotid artery: Secondary | ICD-10-CM | POA: Diagnosis present

## 2013-11-07 DIAGNOSIS — Z7982 Long term (current) use of aspirin: Secondary | ICD-10-CM

## 2013-11-07 DIAGNOSIS — R29810 Facial weakness: Secondary | ICD-10-CM

## 2013-11-07 DIAGNOSIS — I130 Hypertensive heart and chronic kidney disease with heart failure and stage 1 through stage 4 chronic kidney disease, or unspecified chronic kidney disease: Secondary | ICD-10-CM | POA: Diagnosis present

## 2013-11-07 DIAGNOSIS — N179 Acute kidney failure, unspecified: Secondary | ICD-10-CM

## 2013-11-07 DIAGNOSIS — I6350 Cerebral infarction due to unspecified occlusion or stenosis of unspecified cerebral artery: Secondary | ICD-10-CM

## 2013-11-07 DIAGNOSIS — Z888 Allergy status to other drugs, medicaments and biological substances status: Secondary | ICD-10-CM

## 2013-11-07 DIAGNOSIS — I509 Heart failure, unspecified: Secondary | ICD-10-CM

## 2013-11-07 DIAGNOSIS — Z515 Encounter for palliative care: Secondary | ICD-10-CM

## 2013-11-07 DIAGNOSIS — M129 Arthropathy, unspecified: Secondary | ICD-10-CM | POA: Diagnosis present

## 2013-11-07 DIAGNOSIS — I1310 Hypertensive heart and chronic kidney disease without heart failure, with stage 1 through stage 4 chronic kidney disease, or unspecified chronic kidney disease: Secondary | ICD-10-CM

## 2013-11-07 HISTORY — DX: Acute respiratory failure, unspecified whether with hypoxia or hypercapnia: J96.00

## 2013-11-07 LAB — BASIC METABOLIC PANEL
BUN: 40 mg/dL — ABNORMAL HIGH (ref 6–23)
CALCIUM: 9 mg/dL (ref 8.4–10.5)
CO2: 20 meq/L (ref 19–32)
Chloride: 101 mEq/L (ref 96–112)
Creatinine, Ser: 2.66 mg/dL — ABNORMAL HIGH (ref 0.50–1.35)
GFR calc Af Amer: 22 mL/min — ABNORMAL LOW (ref >90)
GFR calc non Af Amer: 19 mL/min — ABNORMAL LOW (ref >90)
Glucose, Bld: 226 mg/dL — ABNORMAL HIGH (ref 70–99)
Potassium: 5 mEq/L (ref 3.7–5.3)
SODIUM: 137 meq/L (ref 137–147)

## 2013-11-07 LAB — PROCALCITONIN: Procalcitonin: 1.36 ng/mL

## 2013-11-07 LAB — APTT: aPTT: 24 seconds (ref 24–37)

## 2013-11-07 LAB — POCT I-STAT, CHEM 8
BUN: 38 mg/dL — ABNORMAL HIGH (ref 6–23)
Calcium, Ion: 1.23 mmol/L (ref 1.13–1.30)
Chloride: 106 mEq/L (ref 96–112)
Creatinine, Ser: 2.7 mg/dL — ABNORMAL HIGH (ref 0.50–1.35)
Glucose, Bld: 228 mg/dL — ABNORMAL HIGH (ref 70–99)
HEMATOCRIT: 37 % — AB (ref 39.0–52.0)
Hemoglobin: 12.6 g/dL — ABNORMAL LOW (ref 13.0–17.0)
POTASSIUM: 4.8 meq/L (ref 3.7–5.3)
Sodium: 137 mEq/L (ref 137–147)
TCO2: 22 mmol/L (ref 0–100)

## 2013-11-07 LAB — BLOOD GAS, ARTERIAL
ACID-BASE DEFICIT: 8.9 mmol/L — AB (ref 0.0–2.0)
ACID-BASE DEFICIT: 2.6 mmol/L — AB (ref 0.0–2.0)
BICARBONATE: 18.6 meq/L — AB (ref 20.0–24.0)
Bicarbonate: 20.6 mEq/L (ref 20.0–24.0)
Drawn by: 22223
Drawn by: 295031
FIO2: 100 %
FIO2: 0.5 %
MECHVT: 500 mL
O2 SAT: 99 %
O2 Saturation: 96.1 %
PCO2 ART: 48.1 mmHg — AB (ref 35.0–45.0)
PCO2 ART: 31.6 mmHg — AB (ref 35.0–45.0)
PEEP/CPAP: 5 cmH2O
PEEP: 5 cmH2O
PH ART: 7.43 (ref 7.350–7.450)
PO2 ART: 276 mmHg — AB (ref 80.0–100.0)
Patient temperature: 98.6
RATE: 14 resp/min
RATE: 16 resp/min
TCO2: 17.2 mmol/L (ref 0–100)
TCO2: 19.2 mmol/L (ref 0–100)
VT: 500 mL
pH, Arterial: 7.213 — ABNORMAL LOW (ref 7.350–7.450)
pO2, Arterial: 78.7 mmHg — ABNORMAL LOW (ref 80.0–100.0)

## 2013-11-07 LAB — CBC WITH DIFFERENTIAL/PLATELET
Basophils Absolute: 0 10*3/uL (ref 0.0–0.1)
Basophils Relative: 0 % (ref 0–1)
EOS ABS: 0.1 10*3/uL (ref 0.0–0.7)
EOS PCT: 1 % (ref 0–5)
HCT: 33.4 % — ABNORMAL LOW (ref 39.0–52.0)
HEMOGLOBIN: 11.5 g/dL — AB (ref 13.0–17.0)
LYMPHS ABS: 2.7 10*3/uL (ref 0.7–4.0)
Lymphocytes Relative: 23 % (ref 12–46)
MCH: 32.8 pg (ref 26.0–34.0)
MCHC: 34.4 g/dL (ref 30.0–36.0)
MCV: 95.2 fL (ref 78.0–100.0)
MONO ABS: 1.5 10*3/uL — AB (ref 0.1–1.0)
MONOS PCT: 13 % — AB (ref 3–12)
Neutro Abs: 7.1 10*3/uL (ref 1.7–7.7)
Neutrophils Relative %: 62 % (ref 43–77)
PLATELETS: 247 10*3/uL (ref 150–400)
RBC: 3.51 MIL/uL — AB (ref 4.22–5.81)
RDW: 13.7 % (ref 11.5–15.5)
WBC: 11.4 10*3/uL — ABNORMAL HIGH (ref 4.0–10.5)

## 2013-11-07 LAB — GLUCOSE, CAPILLARY
GLUCOSE-CAPILLARY: 206 mg/dL — AB (ref 70–99)
GLUCOSE-CAPILLARY: 101 mg/dL — AB (ref 70–99)
Glucose-Capillary: 108 mg/dL — ABNORMAL HIGH (ref 70–99)

## 2013-11-07 LAB — TRIGLYCERIDES: Triglycerides: 74 mg/dL (ref <150)

## 2013-11-07 LAB — URINALYSIS, ROUTINE W REFLEX MICROSCOPIC
Bilirubin Urine: NEGATIVE
Glucose, UA: NEGATIVE mg/dL
Ketones, ur: NEGATIVE mg/dL
NITRITE: NEGATIVE
Protein, ur: NEGATIVE mg/dL
Specific Gravity, Urine: 1.008 (ref 1.005–1.030)
Urobilinogen, UA: 0.2 mg/dL (ref 0.0–1.0)
pH: 5 (ref 5.0–8.0)

## 2013-11-07 LAB — TROPONIN I
Troponin I: <0.3 ng/mL (ref <0.30)
Troponin I: >20 ng/mL (ref <0.30)
Troponin I: >20 ng/mL (ref <0.30)

## 2013-11-07 LAB — INFLUENZA PANEL BY PCR (TYPE A & B)
H1N1 flu by pcr: NOT DETECTED
INFLBPCR: NEGATIVE
Influenza A By PCR: NEGATIVE

## 2013-11-07 LAB — MRSA PCR SCREENING: MRSA BY PCR: NEGATIVE

## 2013-11-07 LAB — PROTIME-INR
INR: 1.1 (ref 0.00–1.49)
Prothrombin Time: 14 seconds (ref 11.6–15.2)

## 2013-11-07 LAB — PRO B NATRIURETIC PEPTIDE: PRO B NATRI PEPTIDE: 8303 pg/mL — AB (ref 0–450)

## 2013-11-07 LAB — LACTIC ACID, PLASMA: Lactic Acid, Venous: 1.4 mmol/L (ref 0.5–2.2)

## 2013-11-07 LAB — POCT I-STAT TROPONIN I: TROPONIN I, POC: 0.04 ng/mL (ref 0.00–0.08)

## 2013-11-07 LAB — URINE MICROSCOPIC-ADD ON

## 2013-11-07 LAB — STREP PNEUMONIAE URINARY ANTIGEN: Strep Pneumo Urinary Antigen: NEGATIVE

## 2013-11-07 MED ORDER — ONDANSETRON HCL 4 MG/2ML IJ SOLN: 4.0000 mg | Freq: Three times a day (TID) | INTRAMUSCULAR | Status: DC | PRN

## 2013-11-07 MED ORDER — PIPERACILLIN-TAZOBACTAM IN DEX 2-0.25 GM/50ML IV SOLN
2.2500 g | Freq: Four times a day (QID) | INTRAVENOUS | Status: DC
Start: 2013-11-07 — End: 2013-11-10
  Administered 2013-11-07 – 2013-11-10 (×12): 2.25 g via INTRAVENOUS
  Filled 2013-11-07 (×15): qty 50

## 2013-11-07 MED ORDER — ROCURONIUM BROMIDE 50 MG/5ML IV SOLN
INTRAVENOUS | Status: AC
  Administered 2013-11-07: 50 mg
  Filled 2013-11-07: qty 2

## 2013-11-07 MED ORDER — ETOMIDATE 2 MG/ML IV SOLN
INTRAVENOUS | Status: AC
  Administered 2013-11-07: 10 mg
  Filled 2013-11-07: qty 20

## 2013-11-07 MED ORDER — PROPOFOL 10 MG/ML IV EMUL: 5.0000 ug/kg/min | Freq: Once | INTRAVENOUS | Status: DC

## 2013-11-07 MED ORDER — FUROSEMIDE 10 MG/ML IJ SOLN
40.0000 mg | INTRAMUSCULAR | Status: AC
  Administered 2013-11-07: 40 mg via INTRAVENOUS
  Filled 2013-11-07: qty 4

## 2013-11-07 MED ORDER — VANCOMYCIN HCL 10 G IV SOLR
1500.0000 mg | Freq: Once | INTRAVENOUS | Status: AC
  Administered 2013-11-07: 1500 mg via INTRAVENOUS
  Filled 2013-11-07: qty 1500

## 2013-11-07 MED ORDER — PROPOFOL 10 MG/ML IV EMUL
INTRAVENOUS | Status: AC
  Filled 2013-11-07: qty 100

## 2013-11-07 MED ORDER — SUCCINYLCHOLINE CHLORIDE 20 MG/ML IJ SOLN
INTRAMUSCULAR | Status: AC
  Administered 2013-11-07: 100 mg
  Filled 2013-11-07: qty 1

## 2013-11-07 MED ORDER — VITAL HIGH PROTEIN PO LIQD
1000.0000 mL | ORAL | Status: DC
  Administered 2013-11-07: 1000 mL
  Filled 2013-11-07 (×2): qty 1000

## 2013-11-07 MED ORDER — PROPOFOL 10 MG/ML IV BOLUS
40.0000 mg | Freq: Once | INTRAVENOUS | Status: AC
  Administered 2013-11-07: 40 mg via INTRAVENOUS

## 2013-11-07 MED ORDER — ASPIRIN 325 MG PO TABS
325.0000 mg | ORAL_TABLET | Freq: Every day | ORAL | Status: DC
  Administered 2013-11-07 – 2013-11-08 (×2): 325 mg via ORAL
  Filled 2013-11-07 (×3): qty 1

## 2013-11-07 MED ORDER — METOPROLOL TARTRATE 50 MG PO TABS
50.0000 mg | ORAL_TABLET | Freq: Two times a day (BID) | ORAL | Status: DC
  Administered 2013-11-07 – 2013-11-22 (×31): 50 mg via ORAL
  Filled 2013-11-07 (×32): qty 1

## 2013-11-07 MED ORDER — NITROGLYCERIN IN D5W 200-5 MCG/ML-% IV SOLN
INTRAVENOUS | Status: AC
  Filled 2013-11-07: qty 250

## 2013-11-07 MED ORDER — VITAL AF 1.2 CAL PO LIQD
1000.0000 mL | Freq: Every day | ORAL | Status: DC
  Filled 2013-11-07: qty 1000

## 2013-11-07 MED ORDER — PROPOFOL 10 MG/ML IV EMUL
5.0000 ug/kg/min | Freq: Once | INTRAVENOUS | Status: AC
  Administered 2013-11-07: 5 ug/kg/min via INTRAVENOUS
  Administered 2013-11-07: 40 ug/kg/min via INTRAVENOUS

## 2013-11-07 MED ORDER — DOXAZOSIN MESYLATE 1 MG PO TABS
1.0000 mg | ORAL_TABLET | Freq: Every day | ORAL | Status: DC
  Administered 2013-11-08 – 2013-11-21 (×15): 1 mg via ORAL
  Filled 2013-11-07 (×16): qty 1

## 2013-11-07 MED ORDER — LIDOCAINE HCL (CARDIAC) 20 MG/ML IV SOLN
INTRAVENOUS | Status: AC
  Filled 2013-11-07: qty 5

## 2013-11-07 MED ORDER — PROPOFOL 10 MG/ML IV EMUL
0.0000 ug/kg/min | INTRAVENOUS | Status: DC
  Administered 2013-11-07 (×2): 40 ug/kg/min via INTRAVENOUS
  Administered 2013-11-08: 20 ug/kg/min via INTRAVENOUS
  Administered 2013-11-08: 30 ug/kg/min via INTRAVENOUS
  Filled 2013-11-07 (×5): qty 100

## 2013-11-07 MED ORDER — SODIUM CHLORIDE 0.9 % IV SOLN
250.0000 mL | INTRAVENOUS | Status: DC | PRN
  Administered 2013-11-09: 250 mL via INTRAVENOUS

## 2013-11-07 MED ORDER — INSULIN ASPART 100 UNIT/ML ~~LOC~~ SOLN
0.0000 [IU] | SUBCUTANEOUS | Status: DC
  Administered 2013-11-08: 2 [IU] via SUBCUTANEOUS
  Administered 2013-11-08 – 2013-11-10 (×8): 1 [IU] via SUBCUTANEOUS
  Administered 2013-11-11: 17:00:00 2 [IU] via SUBCUTANEOUS
  Administered 2013-11-11: 3 [IU] via SUBCUTANEOUS
  Administered 2013-11-11: 2 [IU] via SUBCUTANEOUS
  Administered 2013-11-11 – 2013-11-12 (×2): 1 [IU] via SUBCUTANEOUS
  Administered 2013-11-12: 2 [IU] via SUBCUTANEOUS

## 2013-11-07 MED ORDER — NITROGLYCERIN IN D5W 200-5 MCG/ML-% IV SOLN: 5.0000 ug/min | Freq: Once | INTRAVENOUS | Status: DC

## 2013-11-07 MED ORDER — NITROGLYCERIN IN D5W 200-5 MCG/ML-% IV SOLN
5.0000 ug/min | Freq: Once | INTRAVENOUS | Status: AC
  Administered 2013-11-07: 5 ug/min via INTRAVENOUS

## 2013-11-07 MED ORDER — FENTANYL CITRATE 0.05 MG/ML IJ SOLN
50.0000 ug | INTRAMUSCULAR | Status: DC | PRN
  Administered 2013-11-08 – 2013-11-09 (×4): 50 ug via INTRAVENOUS
  Filled 2013-11-07 (×3): qty 2

## 2013-11-07 MED ORDER — ATORVASTATIN CALCIUM 20 MG PO TABS
20.0000 mg | ORAL_TABLET | Freq: Every day | ORAL | Status: DC
  Administered 2013-11-07 – 2013-11-21 (×15): 20 mg via ORAL
  Filled 2013-11-07 (×16): qty 1

## 2013-11-07 MED ORDER — HYDRALAZINE HCL 25 MG PO TABS
25.0000 mg | ORAL_TABLET | Freq: Two times a day (BID) | ORAL | Status: DC
  Administered 2013-11-07 – 2013-11-08 (×2): 25 mg via ORAL
  Filled 2013-11-07 (×4): qty 1

## 2013-11-07 MED ORDER — SENNA 8.6 MG PO TABS
2.0000 | ORAL_TABLET | Freq: Every day | ORAL | Status: DC | PRN
  Administered 2013-11-21: 17.2 mg via ORAL
  Filled 2013-11-07: qty 2

## 2013-11-07 MED ORDER — PANTOPRAZOLE SODIUM 40 MG PO PACK
40.0000 mg | PACK | Freq: Every day | ORAL | Status: DC
  Administered 2013-11-07 – 2013-11-11 (×4): 40 mg
  Filled 2013-11-07 (×6): qty 20

## 2013-11-07 MED ORDER — HEPARIN SODIUM (PORCINE) 5000 UNIT/ML IJ SOLN
5000.0000 [IU] | Freq: Three times a day (TID) | INTRAMUSCULAR | Status: DC
  Administered 2013-11-07 – 2013-11-22 (×45): 5000 [IU] via SUBCUTANEOUS
  Filled 2013-11-07 (×48): qty 1

## 2013-11-07 NOTE — Progress Notes (Signed)
ANTIBIOTIC CONSULT NOTE - INITIAL  Pharmacy Consult for vancomycin/zosyn Indication: pneumonia  Allergies  Allergen Reactions  . Lasix [Furosemide] Itching    Scalp broke out  . Bystolic [Nebivolol Hcl] Rash  . Plavix [Clopidogrel Bisulfate] Rash    Patient Measurements: Height: 6' (182.9 cm) Weight: 186 lb (84.369 kg) IBW/kg (Calculated) : 77.6 Adjusted Body Weight:   Vital Signs: Temp: 97.6 F (36.4 C) (01/12 1020) Temp src: Core (Comment) (01/12 0459) BP: 127/79 mmHg (01/12 1020) Pulse Rate: 59 (01/12 1020) Intake/Output from previous day:   Intake/Output from this shift:    Labs:  Recent Labs  11/07/13 0444 11/07/13 0456  WBC 11.4*  --   HGB 11.5* 12.6*  PLT 247  --   CREATININE 2.66* 2.70*   Estimated Creatinine Clearance: 19.2 ml/min (by C-G formula based on Cr of 2.7). No results found for this basename: VANCOTROUGH, VANCOPEAK, VANCORANDOM, GENTTROUGH, GENTPEAK, GENTRANDOM, TOBRATROUGH, TOBRAPEAK, TOBRARND, AMIKACINPEAK, AMIKACINTROU, AMIKACIN,  in the last 72 hours   Microbiology: No results found for this or any previous visit (from the past 720 hour(s)).  Medical History: Past Medical History  Diagnosis Date  . Coronary artery disease   . Hypertension   . Pacemaker 05/08/2009    Medtronic  . CKD (chronic kidney disease), stage III   . Complete heart block   . GERD (gastroesophageal reflux disease)   . Diastolic heart failure   . Myocardial infarction 2010  . Stroke 1998, 2005, 2009  . Shingles   . CHF (congestive heart failure)     diastolic  . COPD (chronic obstructive pulmonary disease)   . Arthritis   . Left-sided weakness   . Peripheral edema   . AAA (abdominal aortic aneurysm)     infrarenal 03/12/12- 3.7 x 3.5 cm   Assessment: 89 YOM transferred from [facility] with respiratory failure on ventilator.  Orders to start broad spectrum antibiotics.  His Scr is noted to be elevated at time of admission.  CXR consistent with pulmonary edema vs  pneumonia.  1/12 >>vancomycin  >> 1/12 >>zosyn  >>    Tmax: afeb (Tmin = 96.4) WBCs: 11.4 Renal: SCr = 2.7 for CrCl = 104ml/min (C-G) and 34ml/min (N)  Goal of Therapy:  Vancomycin trough level 15-20 mcg/ml  Plan:   Does not appear any antibiotic doses were administered at [facility], Vancomycin 1500mg  IV q24h x 1 dose  Check random level in am to evaluate clearance based on current Scr and age.  Based on current CrCl would expect would need dose >=48h   Zosyn 2.25gm IV q6h  Juliette Alcide, PharmD, BCPS.   Pager: [phone]  11/07/2013,12:30 PM

## 2013-11-07 NOTE — ED Provider Notes (Signed)
Patient was seen and evaluated in the stabilization treatment provided by my partner Dr. Hyacinth Meeker. Arrangements are made for the patient be transferred to [facility]. Subsequently, there are no available beds in the ICU at [facility]. I discussed the case with Dr. Darrol Angel critical care, and Marchia Meiers of [facility].  Both physicians are accepting to the patient going to [facility] where there is an ICU bed available. All additional available services are available there as well.  Rolland Porter, MD 11/07/13 [phone]

## 2013-11-07 NOTE — ED Notes (Signed)
[facility] at bedside 

## 2013-11-07 NOTE — Progress Notes (Signed)
Critical Troponin of greater than 20.00. Called value to Dr. Vassie Loll and Dr. Clifton James. Will continue to monitor.  Langley Gauss, RN

## 2013-11-07 NOTE — ED Notes (Signed)
Patient left ED at this time with [facility] crew.  

## 2013-11-07 NOTE — Progress Notes (Signed)
Chart reviewed/patient intubated/will follow for needs.

## 2013-11-07 NOTE — Progress Notes (Addendum)
Alerted of troponin over 20. EKG without ischemic changes. This may be a result of hypotension with poor perfusion although this is a very high troponin level for demand ischemia and likely represents an acute coronary syndrome.  Given advanced age and recent CVA, he is at high risk for bleeding event. Agree with full dose ASA. Full dose heparin would be high risk in this patient with recent CVA. Continue current therapy. Would not recommend invasive evaluation with cardiac cath given advanced age. I will come back to [name] this evening to update the family.   Felicitas Sine 4:33 PM 11/07/2013  Addendum:  7:30pm. Pt hemodynamically stable. No arrythmias. Wife no longer present at bedside. Agree with cycling cardiac markers. EKG in am. Likely acute coronary syndrome. I would favor conservative approach given his advanced age and chronic kidney disease. Should he begin to have arrythmias or hemodynamic instability, could reconsider this. Echo in am to assess wall motion, LVEF.   Elma Shands 7:34 PM 11/07/2013

## 2013-11-07 NOTE — ED Notes (Signed)
2 RNs attempted to pass OG/NG tube several times without success.

## 2013-11-07 NOTE — Consult Note (Signed)
CARDIOLOGY CONSULT NOTE   Patient ID: [name] MRN: [id number] DOB/AGE: 78-Nov-1920 78 y.o.  Admit date: 11/07/2013  Primary Physician   Colette Ribas, MD Primary Cardiologist   Dr. Royann Shivers Reason for Consultation  Acute respiratory failure  HPI: [name] is a 80 y.o. male with a history of diastolic heart failure, CAD s/p STEMI 2010 treated medically, SSS s/p pacemaker placement,  AAA, stage IV CKD, and multiple CVAs who was brought to [facility] ED this morning with altered mental status, respiratory distress & hypoxia requiring intubation. He had a recent admission (09/2013) for extension of his old right CVA. His aspirin was changed to aggrenox (allergy to plavix). Echocardiography and pacemaker interrogation at this time did not support a cardioembolic source. ECHO in 09/2013 revealed normal left ventricle systolic function with mild to moderate aortic stenosis. Duplex US shows a 70% proximal right internal carotid stenosis and an 85% left internal carotid artery stenosis.  He has opted for medical therapy.  The patient is unresponsive and his wife provides answers for him. According to her he had almost been back to his neurological baseline after his December admission but had been having TIA like episodes the past couple days with marked hypertension and focal neurologic defecits. Last night he ate supper but seemed more fatigued than usual. He went to bed and around 4am this morning he woke up from sleep in acute respiratory distress. He told his wife to call EMS. When EMS arrived he consented to intubation and was aware of what was going on around him. She said he did not report and chest pain, palpitations, or abdominal pain.   Past Medical History  Diagnosis Date  . Coronary artery disease   . Hypertension   . Pacemaker 05/08/2009    Medtronic  . CKD (chronic kidney disease), stage III   . Complete heart block   . GERD (gastroesophageal reflux disease)    . Diastolic heart failure   . Myocardial infarction 2010  . Stroke 1998, 2005, 2009  . Shingles   . CHF (congestive heart failure)     diastolic  . COPD (chronic obstructive pulmonary disease)   . Arthritis   . Left-sided weakness   . Peripheral edema   . AAA (abdominal aortic aneurysm)     infrarenal 03/12/12- 3.7 x 3.5 cm     Past Surgical History  Procedure Laterality Date  . Eye surgery      tear duct probing  . Permanent pacemaker insertion  05/08/2009    Medtronic  . Hernia repair  10 yrs ago    [facility]  . Cataract extraction w/phaco  02/03/2012    Procedure: CATARACT EXTRACTION PHACO AND INTRAOCULAR LENS PLACEMENT (IOC);  Surgeon: Loraine Leriche T. Nile Richart, MD;  Location: [facility];  Service: Ophthalmology;  Laterality: Left;  CDE=29.12  . Cataract extraction w/phaco  03/23/2012    Procedure: CATARACT EXTRACTION PHACO AND INTRAOCULAR LENS PLACEMENT (IOC);  Surgeon: Loraine Leriche T. Nile Vasek, MD;  Location: [facility];  Service: Ophthalmology;  Laterality: Right;  CDE 22.98  . Nm myocar perf wall motion  06/13/2009    mild to mod ischemia basal inferior & mid inferior regions    Allergies  Allergen Reactions  . Lasix [Furosemide] Itching    Scalp broke out  . Bystolic [Nebivolol Hcl] Rash  . Plavix [Clopidogrel Bisulfate] Rash    I have reviewed the patient's current medications . atorvastatin  20 mg Oral q1800  . doxazosin  1 mg Oral QHS  . feeding supplement (VITAL AF 1.2 CAL)  1,000 mL Per Tube Q1400  . heparin  5,000 Units Subcutaneous Q8H  . hydrALAZINE  25 mg Oral Q12H  . insulin aspart  0-9 Units Subcutaneous Q4H  . lidocaine (cardiac) 100 mg/38ml      . metoprolol  50 mg Oral BID  . nitroGLYCERIN  5-200 mcg/min Intravenous Once  . pantoprazole sodium  40 mg Per Tube Q1200  . piperacillin-tazobactam (ZOSYN)  IV  2.25 g Intravenous Q6H  . vancomycin  1,500 mg Intravenous Once   . propofol 40 mcg/kg/min (11/07/13 1230)   sodium chloride, fentaNYL, senna  Prior to  Admission medications   Medication Sig Start Date End Date Taking? Authorizing Provider  atorvastatin (LIPITOR) 20 MG tablet Take 1 tablet (20 mg total) by mouth daily at 6 PM. 10/07/13  Yes Catarina Hartshorn, MD  Cholecalciferol (VITAMIN D) 2000 UNITS tablet Take 2,000 Units by mouth daily.   Yes Historical Provider, MD  diltiazem (TIAZAC) 180 MG 24 hr capsule Take 180 mg by mouth daily.   Yes Historical Provider, MD  dipyridamole-aspirin (AGGRENOX) 200-25 MG per 12 hr capsule Take 1 capsule by mouth 2 (two) times daily. 10/07/13  Yes Catarina Hartshorn, MD  doxazosin (CARDURA) 1 MG tablet Take 1 mg by mouth at bedtime.   Yes Historical Provider, MD  ferrous sulfate 325 (65 FE) MG tablet Take 325 mg by mouth daily.   Yes Historical Provider, MD  fluticasone (FLONASE) 50 MCG/ACT nasal spray Place 2 sprays into the nose daily as needed. For allergies 11/13/11  Yes Elliot Cousin, MD  hydrALAZINE (APRESOLINE) 25 MG tablet Take 1 tablet (25 mg total) by mouth every 12 (twelve) hours. 10/08/13  Yes Catarina Hartshorn, MD  isosorbide mononitrate (IMDUR) 15 mg TB24 24 hr tablet Take 0.5 tablets (15 mg total) by mouth daily. 10/07/13  Yes Catarina Hartshorn, MD  metoprolol (LOPRESSOR) 50 MG tablet Take 50 mg by mouth 2 (two) times daily. 08/05/11  Yes Christiane Ha, MD  omeprazole (PRILOSEC) 40 MG capsule Take 40 mg by mouth daily as needed.    Yes Historical Provider, MD  potassium chloride (K-DUR,KLOR-CON) 10 MEQ tablet Take 10 mEq by mouth daily.   Yes Historical Provider, MD  Saw Palmetto 450 MG CAPS Take 1 capsule by mouth daily.   Yes Historical Provider, MD  senna (SENOKOT) 8.6 MG TABS tablet Take 2 tablets by mouth daily as needed for mild constipation.   Yes Historical Provider, MD  torsemide (DEMADEX) 20 MG tablet Take 20 mg by mouth daily as needed (swelling).    Historical Provider, MD  vitamin B-12 (CYANOCOBALAMIN) 500 MCG tablet Take 500 mcg by mouth daily.    Historical Provider, MD     History   Social History  .  Marital Status: Married    Spouse Name: N/A    Number of Children: N/A  . Years of Education: N/A   Occupational History  . Not on file.   Social History Main Topics  . Smoking status: Never Smoker   . Smokeless tobacco: Never Used  . Alcohol Use: Yes     Comment: once a week  . Drug Use: No  . Sexual Activity: Yes    Birth Control/ Protection: None   Other Topics Concern  . Not on file   Social History Narrative  . No narrative on file    Family Status  Relation Status Death Age  . Mother Deceased   .  Father Deceased    Family History  Problem Relation Age of Onset  . Anesthesia problems Neg Hx   . Hypotension Neg Hx   . Malignant hyperthermia Neg Hx   . Pseudochol deficiency Neg Hx      ROS:  Full 14 point review of systems complete and found to be negative unless listed above.  Physical Exam: Blood pressure 127/79, pulse 59, temperature 97.6 F (36.4 C), temperature source Core (Comment), resp. rate 16, height 6' (1.829 m), weight 186 lb (84.369 kg), SpO2 98.00%.  Gen. elderly, well-nourished, in no distress, sedated on propofol  ENT - no lesions Neck: No JVD, no thyromegaly, no carotid bruits  Lungs: Intubated. no use of accessory muscles, no dullness to percussion, clear without rales or rhonchi  Cardiovascular: Rhythm paced on monitor, heart sounds normal, soft murmur, 1+ lower extremity edema  Abdomen: soft and non-tender, no hepatosplenomegaly, BS normal.  Musculoskeletal: No deformities, no cyanosis or clubbing  Neuro: sedated, non focal  Skin: Warm, no lesions/ rash   Labs:   Lab Results  Component Value Date   WBC 11.4* 11/07/2013   HGB 12.6* 11/07/2013   HCT 37.0* 11/07/2013   MCV 95.2 11/07/2013   PLT 247 11/07/2013    Recent Labs  11/07/13 0444  INR 1.10     Recent Labs Lab 11/07/13 0444 11/07/13 0456  NA 137 137  K 5.0 4.8  CL 101 106  CO2 20  --   BUN 40* 38*  CREATININE 2.66* 2.70*  CALCIUM 9.0  --   GLUCOSE 226* 228*    Magnesium  Date Value Range Status  05/03/2009 2.1  1.5 - 2.5 mg/dL Final    Recent Labs  16/10/96 0444  TROPONINI <0.30    Recent Labs  11/07/13 0456  TROPIPOC 0.04   Pro B Natriuretic peptide (BNP)  Date/Time Value Range Status  11/07/2013  4:44 AM 8303.0* 0 - 450 pg/mL Final  10/05/2013  1:44 PM 3785.0* 0 - 450 pg/mL Final   Lab Results  Component Value Date   CHOL 185 10/06/2013   HDL 38* 10/06/2013   LDLCALC 134* 10/06/2013   TRIG 64 10/06/2013     Echo: Study Date: 10/06/2013 ------------------------------------------------------------ LV EF: 60% - 65% ------------------------------------------------------------ History: PMH: Coronary artery disease. Congestive heart failure. Stroke. Chronic obstructive pulmonary disease. PMH: Myocardial infarction. Risk factors: Abdominal aortic aneurysm Hypertension. ------------------------------------------------------------ Study Conclusions - Left ventricle: The cavity size was normal. Wall thickness was increased in a pattern of moderate to severe LVH. Systolic function was normal. The estimated ejection fraction was in the range of 60% to 65%. Wall motion was normal; there were no regional wall motion abnormalities. Doppler parameters are consistent with abnormal left ventricular relaxation (grade 1 diastolic dysfunction). - Aortic valve: Moderately calcified annulus. Trileaflet; moderately calcified leaflets. Although relatively low gradients, findings are consistent with mild to moderate stenosis. No significant regurgitation. Mean gradient: 12mm Hg (S). Peak gradient: 27mm Hg (S). Valve area: 1.39cm^2(VTI). VTI LVOT/AV ratio 0.52. - Aortic root: The aortic root was mildly ectatic. - Mitral valve: Moderately calcified annulus. Mildly thickened leaflets . Trivial regurgitation. - Left atrium: The atrium was moderately dilated. - Right ventricle: Pacer wire or catheter noted in right ventricle. - Right atrium:  The atrium was mildly dilated. Central venous pressure: 3mm Hg (est). - Atrial septum: No defect or patent foramen ovale was identified. - Tricuspid valve: Trivial regurgitation. - Pulmonary arteries: PA peak pressure: 34mm Hg (S). - Pericardium, extracardiac: There was no pericardial  effusion. Impressions: - Comparison to prior study October 2012. Moderate to severe LVH with LVEF 60-65% and grade 1 diastolic dysfunction. Moderate left atrial and mild right atrial enlargement. Moderate MAC with trivial mitral regurgitation. Mild to moderate aortic stenosis as outlined above. Pacer wire visualized in right heart. Trivial tricuspid regurgitation with PASP 34 mm mercury. ------------------------------------------------------------ Labs, prior tests, procedures, and surgery: Permanent pacemaker system implantation.  Transthoracic echocardiography. M-mode, complete 2D, spectral Doppler, and color Doppler. Height: Height: 177.8cm. Height: 70in. Weight: Weight: 84.2kg. Weight: 185.2lb. Body mass index: BMI: 26.6kg/m^2. Body surface area: BSA: 2.7857m^2. Blood pressure: 163/80. Patient status: Inpatient. Location: Bedside. ------------------------------------------------------------ ------------------------------------------------------------ Left ventricle: The cavity size was normal. Wall thickness was increased in a pattern of moderate to severe LVH. Systolic function was normal. The estimated ejection fraction was in the range of 60% to 65%. Wall motion was normal; there were no regional wall motion abnormalities. Doppler parameters are consistent with abnormal left ventricular relaxation (grade 1 diastolic dysfunction). ------------------------------------------------------------ Aortic valve: Moderately calcified annulus. Trileaflet; moderately calcified leaflets. VTI LVOT/AV ratio 0.52. Doppler: Although relatively low gradients, findings are consistent with mild to moderate  stenosis. No significant regurgitation. Valve area: 1.39cm^2(VTI). Indexed valve area: 0.69cm^2/m^2 (VTI). Valve area: 1.27cm^2 (Vmax). Indexed valve area: 0.63cm^2/m^2 (Vmax). Mean gradient: 12mm Hg (S). Peak gradient: 27mm Hg (S). ------------------------------------------------------------ Aorta: Aortic root: The aortic root was mildly ectatic. ------------------------------------------------------------ Mitral valve: Moderately calcified annulus. Mildly thickened leaflets . Doppler: Trivial regurgitation. ------------------------------------------------------------ Left atrium: The atrium was moderately dilated. ----------------------------------------------------------- Atrial septum: No defect or patent foramen ovale was identified. ------------------------------------------------------------ Right ventricle: The cavity size was normal. Pacer wire or catheter noted in right ventricle. Systolic function was normal. ------------------------------------------------------------ Pulmonic valve: The valve appears to be grossly normal. Doppler: Trivial regurgitation. ------------------------------------------------------------ Tricuspid valve: The valve appears to be grossly normal. Doppler: Trivial regurgitation. ------------------------------------------------------------ Right atrium: The atrium was mildly dilated. Pacer wire or catheter noted in right atrium. ------------------------------------------------------------ Pericardium: There was no pericardial effusion. ------------------------------------------------------------ Systemic veins: Inferior vena cava: The vessel was normal in size; the respirophasic diameter changes were in the normal range (= 50%); findings are consistent with normal central venous pressure.    ECG:  HR 61-Atrial paced rhythm with prolonged AV conduction and PACs  Radiology:  Dg Chest Port 1 View  11/07/2013   CLINICAL DATA:  Respiratory  distress, intubation.  EXAM: PORTABLE CHEST - 1 VIEW  COMPARISON:  Chest radiograph October 05, 2013  FINDINGS: Cardiac silhouette remains moderately enlarged, tortuous, mildly ectatic calcified aorta. Right greater than left lung patchy airspace opacities in a background of increasing interstitial prominence and new subsegmental atelectasis. No pleural effusions. No pneumothorax. Mild central pulmonary vasculature congestion.  Endotracheal tube tip projects 5.6 cm above the carina. Dual lead left cardiac pacemaker in situ. Multiple EKG lines overlie the patient and may obscure subtle underlying pathology. soft tissue planes and included osseous structures are nonsuspicious.  IMPRESSION: Stable cardiomegaly, interval increase atelectasis, interstitial and alveolar airspace opacities, which may reflect pulmonary edema or pneumonia.     ASSESSMENT AND PLAN:    Active Problems:   CVA (cerebral infarction)   HTN (hypertension)   CKD (chronic kidney disease), stage III   CAD (coronary artery disease)   AAA (abdominal aortic aneurysm)   Pacemaker - Medtronic adapta dual-chamber implanted 2010   Acute respiratory distress   CHF (congestive heart failure)   Acute respiratory failure  [name] is a 80 y.o. male with a history of diastolic heart failure, CAD, SSS s/p  pacemaker placement,  AAA, stage IV CKD, and multiple CVAs who was brought to [facility] ED this morning with altered mental status, respiratory distress & hypoxia requiring intubation.  PRIMARY PROBLEM Acute resp failure: favor aspiration pneumonia in setting of recent CVA. CXR shows right infiltrate with some interstitial edema. Will continue to hold torsemide in the setting of acute kidney injury. Treat with empiric antibiotics and cardiology will follow along.   Diastolic heart failure- EF 60-65% and grade 1 diastolic dysfunction. Mod L atrial and mild R atrial enlargement. Mild to mod AS.  -- BNP 8 K, CXR with possible pulm  edema, some LE edema on exam -- Continue cardizem 60 tid & lopressor 50 bid  -- Torsemide on hold in the setting of acute kidney injury  AKI on CKD - 2.7 currently. Baseline cr 2.1 in 09/2013  -- Continue to hold torsemide   CAD s/p STEMI 2010 treated medically -- Troponin neg, no chest pain reported -- Continue statin, BB, on aggrenox, no ASA  NEUROLOGIC: Recent extension of old rt MCA-CVA- MRI not done due to PPM  --ECHO and pacemaker interrogation 09/2013 did not support a cardioembolic source of CVA  -- Continue aggrenox    Signed: Janeece Castillo 11/07/2013 1:39 PM  Pager [phone]  I have personally seen and examined this patient with Darius Parkin, [name]. I agree with the assessment and plan as outlined above. Pt admitted with respiratory failure. Chest x-ray appears to have right sided infiltrate. Possible aspiration pneumonia. Diuretics on hold today with slight worsening in renal function. If his infiltrate does not improve over next day or two may need to consider diuresis. NTG has been stopped. BP stable on propofol. No cardiac recommendations. Will continue to follow with you.   [name] 1:53 PM 11/07/2013

## 2013-11-07 NOTE — ED Notes (Signed)
Blood pressure improved

## 2013-11-07 NOTE — ED Notes (Addendum)
Patient found by wife in respiratory distress. Patient's O2 saturation 66% upon arrival of EMS. Placed on C-PAP via EMS and increased to 93%.

## 2013-11-07 NOTE — ED Notes (Signed)
Report received from Kathlene Cote, RN. Patient intubated. Propofol at 50 mcg/kg/min for sedation. Patient responds to pain. Core temp 96.5, warm blankets provided. Daughter and Wife at bedside. Supportive, transfer papers signed by wife. Awaiting bed assignment at [facility]. Family informed of pending assignment.

## 2013-11-07 NOTE — H&P (Signed)
Name: [name] MRN: [id number] DOB: 1919-09-06    ADMISSION DATE:  11/07/2013 CONSULTATION DATE:  11/07/2013  REFERRING MD :  [facility] PRIMARY SERVICE: P[facility]M  CHIEF COMPLAINT:  resp distress  BRIEF PATIENT DESCRIPTION: 80 y.o. BIBEMS to [facility] in respiratory distress & hypoxic, poor mental status, required intubation, CXR shows R infiltrates PMH - diastolic heart failure, coronary artery disease s/p PPM, 3.7 cm (april 2012 ) abdominal aortic aneurysm, recent adm 09/2013 for extension of his old right CVA -aspirin changed to aggrenox (allergy to plavix)    SIGNIFICANT EVENTS / STUDIES:    LINES / TUBES: ETT 1/12 >>  CULTURES: resp 1/12 >> bld 1/12 >> Rapid flu >> Urine strep >>  ANTIBIOTICS: 1/12 zosyn >> 1/12 vanc >>  HISTORY OF PRESENT ILLNESS:  80 y.o. BIBEMS to [facility] in respiratory distress & hypoxic, poor mental status, required intubation, CXR shows R infiltrates. PMH - diastolic heart failure, coronary artery disease s/p PPM, 3.7 cm (april 2012 ) abdominal aortic aneurysm, recent adm 09/2013 for extension of his old right CVA -aspirin changed to aggrenox (allergy to plavix) Echocardiography and pacemaker check did not support a cardioembolic source    PAST MEDICAL HISTORY :  Past Medical History  Diagnosis Date  . Coronary artery disease   . Hypertension   . Pacemaker 05/08/2009    [employer]  . CKD (chronic kidney disease), stage III   . Complete heart block   . GERD (gastroesophageal reflux disease)   . Diastolic heart failure   . Myocardial infarction 2010  . Stroke 1998, 2005, 2009  . Shingles   . CHF (congestive heart failure)     diastolic  . COPD (chronic obstructive pulmonary disease)   . Arthritis   . Left-sided weakness   . Peripheral edema   . AAA (abdominal aortic aneurysm)     infrarenal 03/12/12- 3.7 x 3.5 cm   Past Surgical History  Procedure Laterality Date  . Eye surgery      tear duct probing  . Permanent pacemaker insertion   05/08/2009    [employer]tronic  . Hernia repair  10 yrs ago    bilateral_[facility]  . Cataract extraction w/phaco  02/03/2012    Procedure: CATARACT EXTRACTION PHACO AND INTRAOCULAR LENS PLACEMENT (IOC);  Surgeon: Loraine Leriche T. Nile Kimrey, MD;  Location: [facility];  Service: Ophthalmology;  Laterality: Left;  CDE=29.12  . Cataract extraction w/phaco  03/23/2012    Procedure: CATARACT EXTRACTION PHACO AND INTRAOCULAR LENS PLACEMENT (IOC);  Surgeon: Loraine Leriche T. Nile Kam, MD;  Location: [facility];  Service: Ophthalmology;  Laterality: Right;  CDE 22.98  . Nm myocar perf wall motion  06/13/2009    mild to mod ischemia basal inferior & mid inferior regions   Prior to Admission medications   Medication Sig Start Date End Date Taking? Authorizing Provider  atorvastatin (LIPITOR) 20 MG tablet Take 1 tablet (20 mg total) by mouth daily at 6 PM. 10/07/13  Yes Catarina Hartshorn, MD  Cholecalciferol (VITAMIN D) 2000 UNITS tablet Take 2,000 Units by mouth daily.   Yes Historical Provider, MD  diltiazem (TIAZAC) 180 MG 24 hr capsule Take 180 mg by mouth daily.   Yes Historical Provider, MD  dipyridamole-aspirin (AGGRENOX) 200-25 MG per 12 hr capsule Take 1 capsule by mouth 2 (two) times daily. 10/07/13  Yes Catarina Hartshorn, MD  doxazosin (CARDURA) 1 MG tablet Take 1 mg by mouth at bedtime.   Yes Historical Provider, MD  ferrous sulfate 325 (65 FE) MG tablet Take  325 mg by mouth daily.   Yes Historical Provider, MD  fluticasone (FLONASE) 50 MCG/ACT nasal spray Place 2 sprays into the nose daily as needed. For allergies 11/13/11  Yes Elliot Cousin, MD  hydrALAZINE (APRESOLINE) 25 MG tablet Take 1 tablet (25 mg total) by mouth every 12 (twelve) hours. 10/08/13  Yes Catarina Hartshorn, MD  isosorbide mononitrate (IMDUR) 15 mg TB24 24 hr tablet Take 0.5 tablets (15 mg total) by mouth daily. 10/07/13  Yes Catarina Hartshorn, MD  metoprolol (LOPRESSOR) 50 MG tablet Take 50 mg by mouth 2 (two) times daily. 08/05/11  Yes Christiane Ha, MD  omeprazole (PRILOSEC) 40 MG  capsule Take 40 mg by mouth daily as needed.    Yes Historical Provider, MD  potassium chloride (K-DUR,KLOR-CON) 10 MEQ tablet Take 10 mEq by mouth daily.   Yes Historical Provider, MD  Saw Palmetto 450 MG CAPS Take 1 capsule by mouth daily.   Yes Historical Provider, MD  senna (SENOKOT) 8.6 MG TABS tablet Take 2 tablets by mouth daily as needed for mild constipation.   Yes Historical Provider, MD  torsemide (DEMADEX) 20 MG tablet Take 20 mg by mouth daily as needed (swelling).    Historical Provider, MD  vitamin B-12 (CYANOCOBALAMIN) 500 MCG tablet Take 500 mcg by mouth daily.    Historical Provider, MD   Allergies  Allergen Reactions  . Lasix [Furosemide] Itching    Scalp broke out  . Bystolic [Nebivolol Hcl] Rash  . Plavix [Clopidogrel Bisulfate] Rash    FAMILY HISTORY:  Family History  Problem Relation Age of Onset  . Anesthesia problems Neg Hx   . Hypotension Neg Hx   . Malignant hyperthermia Neg Hx   . Pseudochol deficiency Neg Hx    SOCIAL HISTORY:  reports that he has never smoked. He does not have any smokeless tobacco history on file. He reports that he drinks alcohol. He reports that he does not use illicit drugs.  REVIEW OF SYSTEMS:  Unable to obtain  SUBJECTIVE:   VITAL SIGNS: Temp:  [96.4 F (35.8 C)-97.6 F (36.4 C)] 97.6 F (36.4 C) (01/12 1020) Pulse Rate:  [29-101] 59 (01/12 1020) Resp:  [15-23] 16 (01/12 1020) BP: (90-167)/(59-103) 127/79 mmHg (01/12 1020) SpO2:  [92 %-99 %] 98 % (01/12 1020) FiO2 (%):  [60 %-100 %] 60 % (01/12 0655) Weight:  [84.369 kg (186 lb)] 84.369 kg (186 lb) (01/12 0446) HEMODYNAMICS:   VENTILATOR SETTINGS: Vent Mode:  [-] PRVC FiO2 (%):  [60 %-100 %] 60 % Set Rate:  [14 bmp-16 bmp] 16 bmp Vt Set:  [500 mL] 500 mL PEEP:  [5 cmH20] 5 cmH20 Plateau Pressure:  [15 cmH20-16 cmH20] 15 cmH20 INTAKE / OUTPUT: Intake/Output   None     PHYSICAL EXAMINATION: Gen. elderly, well-nourished, in no distress,sedated on propofol ENT -  no lesions, no post nasal drip Neck: No JVD, no thyromegaly, no carotid bruits Lungs: no use of accessory muscles, no dullness to percussion, clear without rales or rhonchi  Cardiovascular: Rhythm paced on monitor, heart sounds  normal, no murmurs, no peripheral edema Abdomen: soft and non-tender, no hepatosplenomegaly, BS normal. Musculoskeletal: No deformities, no cyanosis or clubbing Neuro: sedated, non focal Skin:  Warm, no lesions/ rash   LABS:  CBC  Recent Labs Lab 11/07/13 0444 11/07/13 0456  WBC 11.4*  --   HGB 11.5* 12.6*  HCT 33.4* 37.0*  PLT 247  --    Coag's  Recent Labs Lab 11/07/13 0444  APTT 24  INR 1.10   BMET  Recent Labs Lab 11/07/13 0444 11/07/13 0456  NA 137 137  K 5.0 4.8  CL 101 106  CO2 20  --   BUN 40* 38*  CREATININE 2.66* 2.70*  GLUCOSE 226* 228*   Electrolytes  Recent Labs Lab 11/07/13 0444  CALCIUM 9.0   Sepsis Markers  Recent Labs Lab 11/07/13 0600  LATICACIDVEN 1.4   ABG  Recent Labs Lab 11/07/13 0510  PHART 7.213*  PCO2ART 48.1*  PO2ART 276.0*   Liver Enzymes No results found for this basename: AST, ALT, ALKPHOS, BILITOT, ALBUMIN,  in the last 168 hours Cardiac Enzymes  Recent Labs Lab 11/07/13 0444  TROPONINI <0.30  PROBNP 8303.0*   Glucose  Recent Labs Lab 11/07/13 0449  GLUCAP 206*    Imaging Dg Chest Port 1 View  11/07/2013   CLINICAL DATA:  Respiratory distress, intubation.  EXAM: PORTABLE CHEST - 1 VIEW  COMPARISON:  Chest radiograph October 05, 2013  FINDINGS: Cardiac silhouette remains moderately enlarged, tortuous, mildly ectatic calcified aorta. Right greater than left lung patchy airspace opacities in a background of increasing interstitial prominence and new subsegmental atelectasis. No pleural effusions. No pneumothorax. Mild central pulmonary vasculature congestion.  Endotracheal tube tip projects 5.6 cm above the carina. Dual lead left cardiac pacemaker in situ. Multiple EKG lines  overlie the patient and may obscure subtle underlying pathology. soft tissue planes and included osseous structures are nonsuspicious.  IMPRESSION: Stable cardiomegaly, interval increase atelectasis, interstitial and alveolar airspace opacities, which may reflect pulmonary edema or pneumonia.   Electronically Signed   By: Awilda Metroourtnay  Bloomer   On: 11/07/2013 05:23     CXR: Rt >> Lt ASD  ASSESSMENT / PLAN:  PULMONARY A:Acute resp failure Favor aspiration pneumonia in setting of recent CVA P:   PRVC/ dial down FIO2 Serial CXR FU ABg  CARDIOVASCULAR A: Diastolic dysfucntion -Echocardiography and pacemaker check 09/2013 did not support a cardioembolic source of CVA P:  Ct cardizem 60 tid & lopressor 50 bid OK to dc NTG   RENAL A:  AKI on CKD -baseline cr 2.1 in 09/2013 P:   Hold toresemide  GASTROINTESTINAL A:  No issues P:   Start NG TFs Will need swallow eval once extubated  HEMATOLOGIC A:  No issues P:  Follow WBC  INFECTIOUS A:  Aspiration pna, recent hospitalisation P:   Empiric zosyn/ vanc Obtain flu, pan cx  ENDOCRINE A:  No isues    P:   SSI  NEUROLOGIC A:  Recent extension of old rt MCA-CVA- MRI not done due to PPM P:   Ct aggrenox  TODAY'S SUMMARY: Favor aspn pneumonia rather than CHF as a cause of his symptoms, updated daughters at bedside, he indicated intubation ok- have to clarify CPR status with wife once she arrives  I have personally obtained a history, examined the patient, evaluated laboratory and imaging results, formulated the assessment and plan and placed orders. CRITICAL CARE: The patient is critically ill with multiple organ systems failure and requires high complexity decision making for assessment and support, frequent evaluation and titration of therapies, application of advanced monitoring technologies and extensive interpretation of multiple databases. Critical Care Time devoted to patient care services described in this note is 60  minutes.   Oretha MilchALVA,RAKESH V.  Pulmonary and Critical Care Medicine [facility] Pager: [phone]  11/07/2013, 12:20 PM

## 2013-11-07 NOTE — Progress Notes (Signed)
INITIAL NUTRITION ASSESSMENT  DOCUMENTATION CODES Per approved criteria  -Not Applicable   INTERVENTION: - Initiate TF of Vital High Protein start at 68ml/hr and increase by 62ml every 4 hours to goal of 80ml/hr. Goal rate will provide 1080 calories, 95g protein, and free water. Goal rate of TF plus current calories from Propofol will provide a total of 1616 calories and meet 102% estimated calorie needs and 94% estimated protein needs - Continue adult enteral protocol - Will continue to monitor   NUTRITION DIAGNOSIS: Inadequate oral intake related to inability to eat as evidenced by NPO.   Goal: TF to meet >90% of estimated nutritional needs  Monitor:  Weights, labs, TF tolerance/advancement, vent status, Propofol rate  Reason for Assessment: Ventilated pt, consult for TF management   80 y.o. male  Admitting Dx: Respiratory distress   ASSESSMENT: Pt with hx of cardiac, vascular and cerebral disease - presents with acute SOB in distress - according to EMS, the pt was found by his wife with severe distress and unresponsive - they found him to have sat's of 66%, they started bipap, gave a dose of albuterol during transport - he had remained obtunded but barely able to arouse to voice. 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 He has COPD and AAA. Pt intubated today r/t acute respiratory failure favoring aspiration pneumonia in setting of recent CVA. MD in room currently, talking with family. RD consulted for TF management.   Patient is currently intubated on ventilator support.  MV: 7.7 L/min Temp (24hrs), Avg:96.9 F (36.1 C), Min:96.4 F (35.8 C), Max:97.6 F (36.4 C)  Propofol: 20.3 ml/hr - provides 536 calories   BUN/Cr elevated with low GFR  Height: Ht Readings from Last 1 Encounters:  11/07/13 6' (1.829 m)     Weight: Wt Readings from Last 1 Encounters:  11/07/13 186 lb (84.369 kg)    Ideal Body Weight: 178 lb  % Ideal Body Weight: 104%  Wt Readings from Last 10 Encounters:  11/07/13 186 lb (84.369 kg)  10/11/13 186 lb 9.6 oz (84.641 kg)  10/05/13 185 lb 10 oz (84.2 kg)  08/12/13 187 lb 14.4 oz (85.231 kg)  03/16/12 194 lb (87.998 kg)  01/22/12 195 lb (88.451 kg)  11/13/11 195 lb 15.8 oz (88.9 kg)  08/03/11 202 lb 13.2 oz (92 kg)    Usual Body Weight: 186 lb   % Usual Body Weight: 100%  BMI:  Body mass index is 25.22 kg/(m^2).  Estimated Nutritional Needs: Kcal: 1582 Protein: 101-120g Fluid: >1.5L/day  Skin: intact   Diet Order: NPO  EDUCATION NEEDS: -No education needs identified at this time  No intake or output data in the 24 hours ending 11/07/13 1325  Last BM: PTA  Labs:   Recent Labs Lab 11/07/13 0444 11/07/13 0456  NA 137 137  K 5.0 4.8  CL 101 106  CO2 20  --   BUN 40* 38*  CREATININE 2.66* 2.70*  CALCIUM 9.0  --   GLUCOSE 226* 228*    CBG (last 3)   Recent Labs  11/07/13 0449  GLUCAP 206*    Scheduled Meds: . atorvastatin  20 mg Oral q1800  . doxazosin  1 mg Oral QHS  . feeding supplement (VITAL AF 1.2 CAL)  1,000 mL Per Tube Q1400  . heparin  5,000 Units Subcutaneous  Q8H  . hydrALAZINE  25 mg Oral Q12H  . insulin aspart  0-9 Units Subcutaneous Q4H  . lidocaine (cardiac) 100 mg/675ml      . metoprolol  50 mg Oral BID  . nitroGLYCERIN  5-200 mcg/min Intravenous Once  . pantoprazole sodium  40 mg Per Tube Q1200  . piperacillin-tazobactam (ZOSYN)  IV  2.25 g Intravenous Q6H  . vancomycin  1,500 mg Intravenous Once    Continuous Infusions: . propofol 40 mcg/kg/min (11/07/13 1230)    Past Medical History  Diagnosis Date  . Coronary artery disease   . Hypertension   . Pacemaker 05/08/2009    [employer]  . CKD (chronic kidney disease), stage III   . Complete heart block   . GERD (gastroesophageal reflux disease)   .  Diastolic heart failure   . Myocardial infarction 2010  . Stroke 1998, 2005, 2009  . Shingles   . CHF (congestive heart failure)     diastolic  . COPD (chronic obstructive pulmonary disease)   . Arthritis   . Left-sided weakness   . Peripheral edema   . AAA (abdominal aortic aneurysm)     infrarenal 03/12/12- 3.7 x 3.5 cm    Past Surgical History  Procedure Laterality Date  . Eye surgery      tear duct probing  . Permanent pacemaker insertion  05/08/2009    [employer]  . Hernia repair  10 yrs ago    bilateral_[facility]  . Cataract extraction w/phaco  02/03/2012    Procedure: CATARACT EXTRACTION PHACO AND INTRAOCULAR LENS PLACEMENT (IOC);  Surgeon: Loraine LericheMark T. Nile RiggsShapiro, MD;  Location: [facility];  Service: Ophthalmology;  Laterality: Left;  CDE=29.12  . Cataract extraction w/phaco  03/23/2012    Procedure: CATARACT EXTRACTION PHACO AND INTRAOCULAR LENS PLACEMENT (IOC);  Surgeon: Loraine LericheMark T. Nile RiggsShapiro, MD;  Location: [facility];  Service: Ophthalmology;  Laterality: Right;  CDE 22.98  . Nm myocar perf wall motion  06/13/2009    mild to mod ischemia basal inferior & mid inferior regions    WernersvilleHeather Baron MS, RD, LDN [phone] Pager [phone] After Hours Pager

## 2013-11-07 NOTE — ED Notes (Signed)
Blood pressure trending down. Patient resting, appears comfortable, no longer biting at tube. Propofol decreased to 40 mcg/kg/min. Will reassess.

## 2013-11-07 NOTE — ED Provider Notes (Signed)
CSN: [id number]     Arrival date & time 11/07/13  0435 History   First MD Initiated Contact with Patient 11/07/13 0448     Chief Complaint  Patient presents with  . Respiratory Distress   (Consider location/radiation/quality/duration/timing/severity/associated sxs/prior Treatment) HPI Comments: 80 y/o  Male with hx of sig cardiac, vascular and cerebral disease - presents with acute SOB in distress - according to EMS, the pt was found by his wife with severe distress and unresponsive - they found him to have sat's of 66%, they started bipap, gave a dose of albuterol during transport - he had remained obtunded but barely able to arouse to voice.  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  He has COPD and AAA  The history is provided by the EMS personnel and medical records.    Past Medical History  Diagnosis Date  . Coronary artery disease   . Hypertension   . Pacemaker 05/08/2009    [employer]  . CKD (chronic kidney disease), stage III   . Complete heart block   . GERD (gastroesophageal reflux disease)   . Diastolic heart failure   . Myocardial infarction 2010  . Stroke 1998, 2005, 2009  . Shingles   . CHF (congestive heart failure)     diastolic  . COPD (chronic obstructive pulmonary disease)   . Arthritis   . Left-sided weakness   . Peripheral edema   . AAA (abdominal aortic aneurysm)     infrarenal 03/12/12- 3.7 x 3.5 cm   Past Surgical History  Procedure Laterality Date  . Eye surgery      tear duct probing  . Permanent pacemaker insertion  05/08/2009    [employer]  . Hernia repair  10 yrs ago    [facility]  . Cataract extraction w/phaco  02/03/2012    Procedure: CATARACT EXTRACTION PHACO AND INTRAOCULAR LENS PLACEMENT (IOC);  Surgeon: Loraine Leriche T. Nile File, MD;  Location: [facility];  Service: Ophthalmology;  Laterality: Left;   CDE=29.12  . Cataract extraction w/phaco  03/23/2012    Procedure: CATARACT EXTRACTION PHACO AND INTRAOCULAR LENS PLACEMENT (IOC);  Surgeon: Loraine Leriche T. Nile Christopherson, MD;  Location: [facility];  Service: Ophthalmology;  Laterality: Right;  CDE 22.98  . Nm myocar perf wall motion  06/13/2009    mild to mod ischemia basal inferior & mid inferior regions   Family History  Problem Relation Age of Onset  . Anesthesia problems Neg Hx   . Hypotension Neg Hx   . Malignant hyperthermia Neg Hx   . Pseudochol deficiency Neg Hx    History  Substance Use Topics  . Smoking status: Never Smoker   . Smokeless tobacco: Not on file  . Alcohol Use: Yes     Comment: once a week    Review of Systems  Unable to perform ROS: Acuity of condition    Allergies  Lasix; Bystolic; and Plavix  Home Medications   Current Outpatient Rx  Name  Route  Sig  Dispense  Refill  . atorvastatin (LIPITOR) 20 MG tablet   Oral   Take 1 tablet (20 mg total) by mouth daily at 6 PM.   30 tablet   1   . Cholecalciferol (VITAMIN D) 2000 UNITS tablet   Oral   Take 2,000 Units by mouth daily.         Marland Kitchen  diltiazem (TIAZAC) 180 MG 24 hr capsule   Oral   Take 180 mg by mouth daily.         Marland Kitchen. dipyridamole-aspirin (AGGRENOX) 200-25 MG per 12 hr capsule   Oral   Take 1 capsule by mouth 2 (two) times daily.   60 capsule   1   . doxazosin (CARDURA) 1 MG tablet   Oral   Take 1 mg by mouth at bedtime.         . ferrous sulfate 325 (65 FE) MG tablet   Oral   Take 325 mg by mouth daily.         . fluticasone (FLONASE) 50 MCG/ACT nasal spray   Nasal   Place 2 sprays into the nose daily as needed. For allergies         . hydrALAZINE (APRESOLINE) 25 MG tablet   Oral   Take 1 tablet (25 mg total) by mouth every 12 (twelve) hours.   60 tablet   1   . isosorbide mononitrate (IMDUR) 15 mg TB24 24 hr tablet   Oral   Take 0.5 tablets (15 mg total) by mouth daily.   30 tablet   1   . metoprolol (LOPRESSOR) 50 MG  tablet   Oral   Take 50 mg by mouth 2 (two) times daily.         Marland Kitchen. omeprazole (PRILOSEC) 40 MG capsule   Oral   Take 40 mg by mouth daily.         . Saw Palmetto 450 MG CAPS   Oral   Take 1 capsule by mouth daily.         Marland Kitchen. torsemide (DEMADEX) 20 MG tablet   Oral   Take 20 mg by mouth daily as needed (swelling).         . vitamin B-12 (CYANOCOBALAMIN) 500 MCG tablet   Oral   Take 500 mcg by mouth daily.          BP 167/94  Pulse 96  Temp(Src) 97.3 F (36.3 C) (Core (Comment))  Resp 16  Ht 6' (1.829 m)  Wt 186 lb (84.369 kg)  BMI 25.22 kg/m2  SpO2 94% Physical Exam  Nursing note and vitals reviewed. Constitutional: He appears distressed.  HENT:  Head: Normocephalic and atraumatic.  Mouth/Throat: Oropharynx is clear and moist. No oropharyngeal exudate.  Eyes: Conjunctivae are normal. Right eye exhibits no discharge. Left eye exhibits no discharge. No scleral icterus.  Neck: Normal range of motion. Neck supple. No JVD present. No thyromegaly present.  Cardiovascular: Normal heart sounds and intact distal pulses.  Exam reveals no gallop and no friction rub.   No murmur heard. Tachycardic, strong pulses at radial arteries, JVD present  Pulmonary/Chest: He is in respiratory distress. He has wheezes. He has rales.  Severe increased WOB with accessory muscle use  Abdominal: Soft. Bowel sounds are normal. He exhibits no distension and no mass. There is no tenderness.  Bruising to the RUQ of the abdominal wall  Musculoskeletal: Normal range of motion. He exhibits edema ( bilateral 2+ pitting edema). He exhibits no tenderness.  Lymphadenopathy:    He has no cervical adenopathy.  Neurological:  Obtunded, occasionally opens eyes, occasionally follows commands  Skin: Skin is warm and dry. No rash noted. No erythema.  Bruising to RUQ abd  Psychiatric: He has a normal mood and affect. His behavior is normal.    ED Course  Procedures (including critical care  time) Labs Review Labs Reviewed  BASIC METABOLIC PANEL - Abnormal; Notable for the following:    Glucose, Bld 226 (*)    BUN 40 (*)    Creatinine, Ser 2.66 (*)    GFR calc non Af Amer 19 (*)    GFR calc Af Amer 22 (*)    All other components within normal limits  CBC WITH DIFFERENTIAL - Abnormal; Notable for the following:    WBC 11.4 (*)    RBC 3.51 (*)    Hemoglobin 11.5 (*)    HCT 33.4 (*)    Monocytes Relative 13 (*)    Monocytes Absolute 1.5 (*)    All other components within normal limits  PRO B NATRIURETIC PEPTIDE - Abnormal; Notable for the following:    Pro B Natriuretic peptide (BNP) 8303.0 (*)    All other components within normal limits  GLUCOSE, CAPILLARY - Abnormal; Notable for the following:    Glucose-Capillary 206 (*)    All other components within normal limits  POCT I-STAT, CHEM 8 - Abnormal; Notable for the following:    BUN 38 (*)    Creatinine, Ser 2.70 (*)    Glucose, Bld 228 (*)    Hemoglobin 12.6 (*)    HCT 37.0 (*)    All other components within normal limits  APTT  PROTIME-INR  TROPONIN I  BLOOD GAS, ARTERIAL  LACTIC ACID, PLASMA  POCT I-STAT TROPONIN I   Imaging Review Dg Chest Port 1 View  11/07/2013   CLINICAL DATA:  Respiratory distress, intubation.  EXAM: PORTABLE CHEST - 1 VIEW  COMPARISON:  Chest radiograph October 05, 2013  FINDINGS: Cardiac silhouette remains moderately enlarged, tortuous, mildly ectatic calcified aorta. Right greater than left lung patchy airspace opacities in a background of increasing interstitial prominence and new subsegmental atelectasis. No pleural effusions. No pneumothorax. Mild central pulmonary vasculature congestion.  Endotracheal tube tip projects 5.6 cm above the carina. Dual lead left cardiac pacemaker in situ. Multiple EKG lines overlie the patient and may obscure subtle underlying pathology. soft tissue planes and included osseous structures are nonsuspicious.  IMPRESSION: Stable cardiomegaly, interval  increase atelectasis, interstitial and alveolar airspace opacities, which may reflect pulmonary edema or pneumonia.   Electronically Signed   By: Awilda Metro   On: 11/07/2013 05:23    EKG Interpretation    Date/Time:  Monday November 07 2013 05:42:09 EST Ventricular Rate:  105 PR Interval:  232 QRS Duration: 110 QT Interval:  392 QTC Calculation: 518 R Axis:   -42 Text Interpretation:  Sinus tachycardia with 1st degree A-V block with Premature atrial complexes with Abberant conduction Left axis deviation Left ventricular hypertrophy with repolarization abnormality Abnormal ECG When compared with ECG of 06-Oct-2013 05:53, Sinus rhythm has replaced Electronic atrial pacemaker Vent. rate has increased BY  44 BPM T wave inversion more evident in Lateral leads QT has lengthened Confirmed by Jericho Alcorn  MD, Paco Cislo ([id number]) on 11/07/2013 5:31:12 AM            MDM   1. Acute respiratory distress   2. CHF (congestive heart failure)    Severe distress, likely pulmonary /cardiac source - pt has required Bipap but now is obtunded with sat's of 85% on bipap and not oxynating - I briefly d/w the pt the need for intubation if he should want it and he squeezed my hand answering yes to this intervention.    INTUBATION Performed by: Vida Roller  Required items: required blood products, implants, devices, and special equipment available Patient identity confirmed: provided  demographic data and hospital-assigned identification number Time out: Immediately prior to procedure a "time out" was called to verify the correct patient, procedure, equipment, support staff and site/side marked as required.  Indications: Severe Respiratory distress / hypoxia  Intubation method: Direct Laryngoscopy   Preoxygenation: BVM  Sedatives: 10mg  Etomidate Paralytic: 100mg  Succinylcholine  Tube Size: 8 cuffed  Post-procedure assessment: chest rise and ETCO2 monitor Breath sounds: equal and absent over the  epigastrium Tube secured with: ETT holder Chest x-ray interpreted by radiologist and me.  Chest x-ray findings: endotracheal tube in appropriate position  Patient tolerated the procedure well with no immediate complications.   istat chem shows normal lytes, renal function not good - hx of RI.    5:47 AM  The patient continues to be dyspneic, he is now intubated, sedated, oxygenating appropriately and blood pressure is improving on both nitroglycerin and propofol drips. His x-ray shows what appears to be acute CHF which is consistent with his history. He does have a slight leukocytosis, his renal function is slightly worse than it has been in the past but he does not have a fever. I have discussed his care with the cardiologist Dr. Arlyn Leak as well as with the intensivist Dr. Darrick Penna, the latter of which will accept the patient to intensive care unit bed.  Care was discussed with the family members who are in agreement with transfer to [facility].  CRITICAL CARE Performed by: Vida Roller Total critical care time: 35 Critical care time was exclusive of separately billable procedures and treating other patients. Critical care was necessary to treat or prevent imminent or life-threatening deterioration. Critical care was time spent personally by me on the following activities: development of treatment plan with patient and/or surrogate as well as nursing, discussions with consultants, evaluation of patient's response to treatment, examination of patient, obtaining history from patient or surrogate, ordering and performing treatments and interventions, ordering and review of laboratory studies, ordering and review of radiographic studies, pulse oximetry and re-evaluation of patient's condition.   Vida Roller, MD 11/07/13 [phone]

## 2013-11-08 ENCOUNTER — Inpatient Hospital Stay (HOSPITAL_COMMUNITY): Payer: Medicare Other

## 2013-11-08 DIAGNOSIS — I359 Nonrheumatic aortic valve disorder, unspecified: Secondary | ICD-10-CM

## 2013-11-08 DIAGNOSIS — N183 Chronic kidney disease, stage 3 unspecified: Secondary | ICD-10-CM

## 2013-11-08 DIAGNOSIS — J984 Other disorders of lung: Secondary | ICD-10-CM

## 2013-11-08 DIAGNOSIS — I219 Acute myocardial infarction, unspecified: Secondary | ICD-10-CM | POA: Insufficient documentation

## 2013-11-08 DIAGNOSIS — Z950 Presence of cardiac pacemaker: Secondary | ICD-10-CM

## 2013-11-08 LAB — GLUCOSE, CAPILLARY
GLUCOSE-CAPILLARY: 113 mg/dL — AB (ref 70–99)
GLUCOSE-CAPILLARY: 128 mg/dL — AB (ref 70–99)
Glucose-Capillary: 126 mg/dL — ABNORMAL HIGH (ref 70–99)
Glucose-Capillary: 99 mg/dL (ref 70–99)
Glucose-Capillary: 112 mg/dL — ABNORMAL HIGH (ref 70–99)
Glucose-Capillary: 114 mg/dL — ABNORMAL HIGH (ref 70–99)

## 2013-11-08 LAB — CBC
HEMATOCRIT: 28 % — AB (ref 39.0–52.0)
HEMOGLOBIN: 9.4 g/dL — AB (ref 13.0–17.0)
MCH: 30.3 pg (ref 26.0–34.0)
MCHC: 33.6 g/dL (ref 30.0–36.0)
MCV: 90.3 fL (ref 78.0–100.0)
Platelets: 202 10*3/uL (ref 150–400)
RBC: 3.1 MIL/uL — ABNORMAL LOW (ref 4.22–5.81)
RDW: 13.7 % (ref 11.5–15.5)
WBC: 5.4 10*3/uL (ref 4.0–10.5)

## 2013-11-08 LAB — BASIC METABOLIC PANEL
BUN: 41 mg/dL — AB (ref 6–23)
CO2: 21 mEq/L (ref 19–32)
Calcium: 8.2 mg/dL — ABNORMAL LOW (ref 8.4–10.5)
Chloride: 104 mEq/L (ref 96–112)
Creatinine, Ser: 2.8 mg/dL — ABNORMAL HIGH (ref 0.50–1.35)
GFR calc Af Amer: 21 mL/min — ABNORMAL LOW (ref >90)
GFR, EST NON AFRICAN AMERICAN: 18 mL/min — AB (ref >90)
Glucose, Bld: 121 mg/dL — ABNORMAL HIGH (ref 70–99)
POTASSIUM: 4.4 meq/L (ref 3.7–5.3)
Sodium: 138 mEq/L (ref 137–147)

## 2013-11-08 LAB — LEGIONELLA ANTIGEN, URINE: LEGIONELLA ANTIGEN, URINE: NEGATIVE

## 2013-11-08 LAB — VANCOMYCIN, RANDOM: VANCOMYCIN RM: 19.8 ug/mL

## 2013-11-08 LAB — PHOSPHORUS: Phosphorus: 3.9 mg/dL (ref 2.3–4.6)

## 2013-11-08 LAB — MAGNESIUM: Magnesium: 2.5 mg/dL (ref 1.5–2.5)

## 2013-11-08 MED ORDER — FUROSEMIDE 10 MG/ML IJ SOLN
40.0000 mg | Freq: Two times a day (BID) | INTRAMUSCULAR | Status: DC
  Administered 2013-11-08 (×2): 40 mg via INTRAVENOUS
  Filled 2013-11-08 (×4): qty 4

## 2013-11-08 MED ORDER — VANCOMYCIN HCL IN DEXTROSE 750-5 MG/150ML-% IV SOLN
750.0000 mg | Freq: Once | INTRAVENOUS | Status: AC
Start: 2013-11-08 — End: 2013-11-08
  Administered 2013-11-08: 750 mg via INTRAVENOUS
  Filled 2013-11-08: qty 150

## 2013-11-08 MED ORDER — BIOTENE DRY MOUTH MT LIQD
15.0000 mL | Freq: Two times a day (BID) | OROMUCOSAL | Status: DC
  Administered 2013-11-08 – 2013-11-21 (×25): 15 mL via OROMUCOSAL

## 2013-11-08 MED ORDER — VITAL HIGH PROTEIN PO LIQD
1000.0000 mL | ORAL | Status: DC
  Administered 2013-11-09: 1000 mL
  Filled 2013-11-08 (×4): qty 1000

## 2013-11-08 MED ORDER — HYDRALAZINE HCL 25 MG PO TABS
25.0000 mg | ORAL_TABLET | Freq: Three times a day (TID) | ORAL | Status: DC
  Administered 2013-11-08 – 2013-11-22 (×42): 25 mg via ORAL
  Filled 2013-11-08 (×47): qty 1

## 2013-11-08 MED ORDER — HYDRALAZINE HCL 20 MG/ML IJ SOLN
10.0000 mg | INTRAMUSCULAR | Status: DC | PRN
  Administered 2013-11-08 – 2013-11-10 (×4): 20 mg via INTRAVENOUS
  Filled 2013-11-08 (×4): qty 1

## 2013-11-08 MED ORDER — CHLORHEXIDINE GLUCONATE 0.12 % MT SOLN
15.0000 mL | Freq: Two times a day (BID) | OROMUCOSAL | Status: DC
  Administered 2013-11-08 – 2013-11-21 (×26): 15 mL via OROMUCOSAL
  Filled 2013-11-08 (×30): qty 15

## 2013-11-08 NOTE — Progress Notes (Signed)
  Echocardiogram 2D Echocardiogram has been performed.  [name] 11/08/2013, 9:34 AM

## 2013-11-08 NOTE — Progress Notes (Signed)
ANTIBIOTIC CONSULT NOTE - FOLLOW UP  Pharmacy Consult for vancomycin/zosyn Indication: Aspiration pneumonia  Allergies  Allergen Reactions  . Lasix [Furosemide] Itching    Scalp broke out  . Bystolic [Nebivolol Hcl] Rash  . Plavix [Clopidogrel Bisulfate] Rash    Patient Measurements: Height: 6' (182.9 cm) Weight: 186 lb (84.369 kg) IBW/kg (Calculated) : 77.6 Adjusted Body Weight:   Vital Signs: Temp: 95.9 F (35.5 C) (01/13 0600) BP: 133/78 mmHg (01/13 0600) Pulse Rate: 59 (01/13 0600) Intake/Output from previous day: 01/12 0701 - 01/13 0700 In: 845.6 [I.V.:339.7; NG/GT:305.8; IV Piggyback:200] Out: 1450 [Urine:1450] Intake/Output from this shift:    Labs:  Recent Labs  11/07/13 0444 11/07/13 0456 11/08/13 0340  WBC 11.4*  --  5.4  HGB 11.5* 12.6* 9.4*  PLT 247  --  202  CREATININE 2.66* 2.70* 2.80*   Estimated Creatinine Clearance: 18.5 ml/min (by C-G formula based on Cr of 2.8).  Recent Labs  11/08/13 0340  VANCORANDOM 19.8     Microbiology: Recent Results (from the past 720 hour(s))  MRSA PCR SCREENING     Status: None   Collection Time    11/07/13 12:38 PM      Result Value Range Status   MRSA by PCR NEGATIVE  NEGATIVE Final   Comment:            The GeneXpert MRSA Assay (FDA     approved for NASAL specimens     only), is one component of a     comprehensive MRSA colonization     surveillance program. It is not     intended to diagnose MRSA     infection nor to guide or     monitor treatment for     MRSA infections.  CULTURE, RESPIRATORY (NON-EXPECTORATED)     Status: None   Collection Time    11/07/13 12:39 PM      Result Value Range Status   Specimen Description TRACHEAL ASPIRATE   Final   Special Requests NONE   Final   Gram Stain     Final   Value: MODERATE WBC PRESENT,BOTH PMN AND MONONUCLEAR     RARE SQUAMOUS EPITHELIAL CELLS PRESENT     RARE GRAM POSITIVE COCCI     IN PAIRS     Performed at [facility]   Culture      Final   Value: Culture reincubated for better growth     Performed at [facility]   Report Status PENDING   Incomplete  CULTURE, BLOOD (ROUTINE X 2)     Status: None   Collection Time    11/07/13  1:25 PM      Result Value Range Status   Specimen Description BLOOD LEFT HAND   Final   Special Requests BOTTLES DRAWN AEROBIC ONLY 3CC   Final   Culture  Setup Time     Final   Value: 11/07/2013 16:02     Performed at [facility]   Culture     Final   Value:        BLOOD CULTURE RECEIVED NO GROWTH TO DATE CULTURE WILL BE HELD FOR 5 DAYS BEFORE ISSUING A FINAL NEGATIVE REPORT     Performed at [facility]   Report Status PENDING   Incomplete  CULTURE, BLOOD (ROUTINE X 2)     Status: None   Collection Time    11/07/13  1:30 PM      Result Value Range Status   Specimen  Description BLOOD LEFT HAND   Final   Special Requests BOTTLES DRAWN AEROBIC AND ANAEROBIC 5CC   Final   Culture  Setup Time     Final   Value: 11/07/2013 16:03     [facility]   Culture     Final   Value:        BLOOD CULTURE RECEIVED NO GROWTH TO DATE CULTURE WILL BE HELD FOR 5 DAYS BEFORE ISSUING A FINAL NEGATIVE REPORT     [facility]   Report Status PENDING   Incomplete    Anti-infectives   Start     Dose/Rate Route Frequency Ordered Stop   11/07/13 1400  vancomycin (VANCOCIN) 1,500 mg in sodium chloride 0.9 % 500 mL IVPB     1,500 mg 250 mL/hr over 120 Minutes Intravenous  Once 11/07/13 1244 11/07/13 1712   11/07/13 1400  piperacillin-tazobactam (ZOSYN) IVPB 2.25 g     2.25 g 100 mL/hr over 30 Minutes Intravenous 4 times per day 11/07/13 1244        Assessment: 89 [facility]. Orders to start broad spectrum antibiotics. His Scr is noted to be elevated at time of admission. CXR consistent with pulmonary edema vs pneumonia.  1/12 >>vancomycin >> 1/12 >>zosyn >>   Tmax: afeb (Tmin =  95.9) WBCs: 5.4 Renal: SCr = 2.8 for CrCl = 6818ml/min (C-G) and 5618ml/min (N), UOP = 0.7 ml/kg/min  1/12 blood: NGTD 1/12: trach aspirate: re-incubated 1/12: influenza PCR: negative  Dose changes/drug level info:  1/13 0500 random vancomycin level = 19.558mcg/ml following vancomycin 1500mg  IV x 1 1/12 at 15:00  Goal of Therapy:  Vancomycin trough level 15-20 mcg/ml  Plan:   Based on random level this am, give vancomycin 750mg  IV x 1 to achieve peak ~6630mcg/ml  Plan on checking random level Thur am  Continue current vancomycin dosing  Juliette Alcideustin Zeigler, PharmD, BCPS.   Pager: [phone]  11/08/2013,9:02 AM

## 2013-11-08 NOTE — Progress Notes (Signed)
Name: [name] MRN: [id number] DOB: 1919/07/04    ADMISSION DATE:  11/07/2013 CONSULTATION DATE:  11/08/2013  REFERRING MD :  [facility] PRIMARY SERVICE: PCCM  CHIEF COMPLAINT:  resp distress  BRIEF PATIENT DESCRIPTION: 80 y.o. BIBEMS to [facility] in respiratory distress & hypoxic, poor mental status, required intubation, CXR shows R infiltrates PMH - diastolic heart failure, coronary artery disease s/p PPM, 3.7 cm (april 2012 ) abdominal aortic aneurysm, recent adm 09/2013 for extension of his old right CVA -aspirin changed to aggrenox (allergy to plavix) Echo and pacemaker check did not support a cardioembolic source . Pos trop > 20    SIGNIFICANT EVENTS / STUDIES:  1/12 cards consult >> no heparin   LINES / TUBES: ETT 1/12 >>  CULTURES: resp 1/12 >> bld 1/12 >> Rapid flu >>neg Urine strep >>neg  ANTIBIOTICS: 1/12 zosyn >> 1/12 vanc >>      SUBJECTIVE: awake on low dose propofol No obvious pain Afebrile Diuresed well  VITAL SIGNS: Temp:  [95.9 F (35.5 C)-97.6 F (36.4 C)] 95.9 F (35.5 C) (01/13 0600) Pulse Rate:  [58-67] 59 (01/13 0600) Resp:  [16] 16 (01/13 0600) BP: (86-178)/(55-98) 133/78 mmHg (01/13 0600) SpO2:  [93 %-100 %] 99 % (01/13 0600) FiO2 (%):  [40 %-50 %] 40 % (01/13 0729) HEMODYNAMICS:   VENTILATOR SETTINGS: Vent Mode:  [-] PRVC FiO2 (%):  [40 %-50 %] 40 % Set Rate:  [16 bmp] 16 bmp Vt Set:  [500 mL] 500 mL PEEP:  [5 cmH20] 5 cmH20 Plateau Pressure:  [13 cmH20-14 cmH20] 14 cmH20 INTAKE / OUTPUT: Intake/Output     01/12 0701 - 01/13 0700 01/13 0701 - 01/14 0700   I.V. (mL/kg) 339.7 (4)    NG/GT 305.8    IV Piggyback 200    Total Intake(mL/kg) 845.6 (10)    Urine (mL/kg/hr) 1450 (0.7)    Total Output 1450     Net -604.4            PHYSICAL EXAMINATION: Gen. elderly, well-nourished, in no distress,sedated on propofol ENT - no lesions, no post nasal drip Neck: No JVD, no thyromegaly, no carotid bruits Lungs: no use of  accessory muscles, no dullness to percussion, clear without rales or rhonchi  Cardiovascular: Rhythm paced on monitor, heart sounds  normal, no murmurs, no peripheral edema Abdomen: soft and non-tender, no hepatosplenomegaly, BS normal. Musculoskeletal: No deformities, no cyanosis or clubbing Neuro: off sedation, weaker on left 4/5 Skin:  Warm, no lesions/ rash   LABS:  CBC  Recent Labs Lab 11/07/13 0444 11/07/13 0456 11/08/13 0340  WBC 11.4*  --  5.4  HGB 11.5* 12.6* 9.4*  HCT 33.4* 37.0* 28.0*  PLT 247  --  202   Coag's  Recent Labs Lab 11/07/13 0444  APTT 24  INR 1.10   BMET  Recent Labs Lab 11/07/13 0444 11/07/13 0456 11/08/13 0340  NA 137 137 138  K 5.0 4.8 4.4  CL 101 106 104  CO2 20  --  21  BUN 40* 38* 41*  CREATININE 2.66* 2.70* 2.80*  GLUCOSE 226* 228* 121*   Electrolytes  Recent Labs Lab 11/07/13 0444 11/08/13 0340  CALCIUM 9.0 8.2*  MG  --  2.5  PHOS  --  3.9   Sepsis Markers  Recent Labs Lab 11/07/13 0600 11/07/13 1216  LATICACIDVEN 1.4  --   PROCALCITON  --  1.36   ABG  Recent Labs Lab 11/07/13 0510 11/07/13 1228  PHART 7.213* 7.430  PCO2ART 48.1* 31.6*  PO2ART 276.0* 78.7*   Liver Enzymes No results found for this basename: AST, ALT, ALKPHOS, BILITOT, ALBUMIN,  in the last 168 hours Cardiac Enzymes  Recent Labs Lab 11/07/13 0444 11/07/13 1240 11/07/13 1808  TROPONINI <0.30 >20.00* >20.00*  PROBNP 8303.0*  --   --    Glucose  Recent Labs Lab 11/07/13 0449 11/07/13 1627 11/07/13 1937 11/08/13 0035 11/08/13 0429 11/08/13 0737  GLUCAP 206* 108* 101* 113* 126* 99    Imaging Dg Chest Port 1 View  11/08/2013   CLINICAL DATA:  Pneumonia.  EXAM: PORTABLE CHEST - 1 VIEW  COMPARISON:  11/07/2013.  FINDINGS: Endotracheal tube and NG tube in stable position. Cardiomegaly with normal pulmonary vascularity. Pacemaker noted in stable position. Pulmonary infiltrate on the right is improved slightly. Persistent  infiltrate remains. Small pleural effusions and left base atelectasis appear to be present.  IMPRESSION: 1. Improving but persistent right lung infiltrate. Small bilateral pleural effusions and mild left base atelectasis.  2.  Stable line and tube positions.   Electronically Signed   By: Maisie Fus  Register   On: 11/08/2013 07:09   Dg Chest Port 1 View  11/07/2013   CLINICAL DATA:  Respiratory distress, intubation.  EXAM: PORTABLE CHEST - 1 VIEW  COMPARISON:  Chest radiograph October 05, 2013  FINDINGS: Cardiac silhouette remains moderately enlarged, tortuous, mildly ectatic calcified aorta. Right greater than left lung patchy airspace opacities in a background of increasing interstitial prominence and new subsegmental atelectasis. No pleural effusions. No pneumothorax. Mild central pulmonary vasculature congestion.  Endotracheal tube tip projects 5.6 cm above the carina. Dual lead left cardiac pacemaker in situ. Multiple EKG lines overlie the patient and may obscure subtle underlying pathology. soft tissue planes and included osseous structures are nonsuspicious.  IMPRESSION: Stable cardiomegaly, interval increase atelectasis, interstitial and alveolar airspace opacities, which may reflect pulmonary edema or pneumonia.   Electronically Signed   By: Awilda Metro   On: 11/07/2013 05:23   Dg Abd Portable 1v  11/07/2013   CLINICAL DATA:  80 year old male status post NG tube placement. Initial encounter.  EXAM: PORTABLE ABDOMEN - 1 VIEW  COMPARISON:  Upper GI series 329 2006.  FINDINGS: Portable AP supine view at 1428 hrs. Enteric tube in place, side hole projects at the level of the distal thoracic esophagus or gastroesophageal junction. The tip of the tube is at the level of the gastric cardia. Visualized bowel gas pattern is non obstructed. Cardiac pacemaker leads partially visible. Degenerative changes in the spine.  IMPRESSION: NG tube tip at the proximal stomach. Advance 8 cm for more optimal placement.    Electronically Signed   By: Augusto Gamble M.D.   On: 11/07/2013 14:43     CXR: Rt >> Lt ASD -improved 1/13  ASSESSMENT / PLAN:  PULMONARY A:Acute resp failure Favor aspiration pneumonia in setting of recent CVA P:   SBTs -coordinate with [facility]   CARDIOVASCULAR A: Diastolic dysfucntion -Echocardiography and pacemaker check 09/2013 did not support a cardioembolic source of CVA NSTEMi - not a candidate for invasive intervention, high risk heparin P:  Ct cardizem 60 tid & lopressor 50 bid Await echo   RENAL A:  AKI on CKD -baseline cr 2.1 in 09/2013 P:   Hold toresemide -lasix 40 q 12h  GASTROINTESTINAL A:  No issues P:   ct NG TFs Will need swallow eval once extubated  HEMATOLOGIC A:  No issues P:  Follow WBC  INFECTIOUS A:  Aspiration pna, recent hospitalisation  P:   Empiric zosyn/ vanc await pan cx data - simplify rapidly  ENDOCRINE A:  No isues    P:   SSI  NEUROLOGIC A:  Recent extension of old rt MCA-CVA- MRI not done due to PPM P:   Ct aggrenox  TODAY'S SUMMARY: Favor aspn pneumonia rather than CHF as a cause of his symptoms, complicated by NSTEMI,  he indicated short term intubation ok-  updated daughters & wife, full code  I have personally obtained a history, examined the patient, evaluated laboratory and imaging results, formulated the assessment and plan and placed orders. CRITICAL CARE: The patient is critically ill with multiple organ systems failure and requires high complexity decision making for assessment and support, frequent evaluation and titration of therapies, application of advanced monitoring technologies and extensive interpretation of multiple databases. Critical Care Time devoted to patient care services described in this note is 60 minutes.   Oretha MilchALVA,RAKESH V.  Pulmonary and Critical Care Medicine [facility] Pager: [phone]  11/08/2013, 8:27 AM

## 2013-11-08 NOTE — Progress Notes (Signed)
NUTRITION FOLLOW UP  Intervention:   - Increase TF of Vital High Protein via NGT, currently running at 46m/hr, by 170mevery 4 hours to goal of 5511mr. Goal rate will provide 1320 calories, 116g protein, and 1103m34mee water. Goal rate of TF plus current calories from Propofol will provide 1587 calories and meet 100% of estimated calorie and protein needs - Will continue to monitor   Nutrition Dx:   Inadequate oral intake related to inability to eat as evidenced by NPO - ongoing   Goal:   TF to meet >90% of estimated nutritional needs - not met but increasing towards goal   Monitor:   Weights, labs, TF tolerance/advancement, vent status  Assessment:   Pt with hx of cardiac, vascular and cerebral disease - presents with acute SOB in distress - according to EMS, the pt was found by his wife with severe distress and unresponsive - they found him to have sat's of 66%, they started bipap, gave a dose of albuterol during transport - he had remained obtunded but barely able to arouse to voice. 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 He has COPD and AAA.   1/12 - Pt intubated today r/t acute respiratory failure favoring aspiration pneumonia in setting of recent CVA. MD in room currently, talking with family. RD consulted for TF management. Initiated TF of Vital High Protein start at 20ml32mand increase by 10ml 50my 4 hours to goal of 45ml/h28m 1/13 - Pt alone in room. Pt getting Vital High Protein at 40ml/hr30mrently via NGT. Per RN, pt tolerating TF well with no residuals this morning. Propofol rate decreased since yesterday. Per MD notes, plan is to continue TF today.   Patient is currently intubated on ventilator support.  MV: 8.1 L/min Temp (24hrs), Avg:96.6 F (35.9 C), Min:95.9 F (35.5 C), Max:97.6 F (36.4 C)  Propofol: 10.1 ml/hr  provides 267 calories   Current TF: Vital High Protein at 40ml/hr 28movides 960 calories, 84g protein, 802ml free21mer meeting 60% estimated calorie needs, 83% estimated protein needs  BUN/Cr elevated with low GFR  Height: Ht Readings from Last 1 Encounters:  11/07/13 6' (1.829 m)    Weight Status:   Wt Readings from Last 1 Encounters:  11/07/13 186 lb (84.369 kg)    Re-estimated needs:  Kcal: 1594 Protein: 101-120g Fluid: >1.5L/day  Skin: +4 RLE, LLE edema  Diet Order: NPO   Intake/Output Summary (Last 24 hours) at 11/08/13 1013 Last data filed at 11/08/13 0606  Gross per 24 hour  Intake 845.56 ml  Output   1450 ml  Net -604.44 ml    Last BM: PTA   Labs:   Recent Labs Lab 11/07/13 0444 11/07/13 0456 11/08/13 0340  NA 137 137 138  K 5.0 4.8 4.4  CL 101 106 104  CO2 20  --  21  BUN 40* 38* 41*  CREATININE 2.66* 2.70* 2.80*  CALCIUM 9.0  --  8.2*  MG  --   --  2.5  PHOS  --   --  3.9  GLUCOSE 226* 228* 121*    CBG (last 3)   Recent Labs  11/08/13 0035 11/08/13 0429 11/08/13 0737  GLUCAP 113* 126* 99    Scheduled Meds: . antiseptic oral rinse  15 mL Mouth Rinse q12n4p  . aspirin  325 mg Oral Daily  . atorvastatin  20 mg Oral q1800  . chlorhexidine  15 mL Mouth Rinse BID  . doxazosin  1 mg Oral QHS  . furosemide  40 mg Intravenous Q12H  . heparin  5,000 Units Subcutaneous Q8H  . hydrALAZINE  25 mg Oral Q8H  . insulin aspart  0-9 Units Subcutaneous Q4H  . metoprolol  50 mg Oral BID  . pantoprazole sodium  40 mg Per Tube Q1200  . piperacillin-tazobactam (ZOSYN)  IV  2.25 g Intravenous Q6H  . vancomycin  750 mg Intravenous Once    Continuous Infusions: . feeding supplement (VITAL HIGH PROTEIN) 1,000 mL (11/08/13 0606)  . propofol 20 mcg/kg/min (11/08/13 0600)     Mikey College MS, RD, [place] Pager [phone] After Hours Pager

## 2013-11-08 NOTE — Progress Notes (Signed)
SUBJECTIVE:  No new events  OBJECTIVE:   Vitals:   Filed Vitals:   11/08/13 0300 11/08/13 0400 11/08/13 0500 11/08/13 0600  BP: 145/83 134/66 144/84 133/78  Pulse: 59 63 59 59  Temp: 96.4 F (35.8 C) 96.3 F (35.7 C) 96.1 F (35.6 C) 95.9 F (35.5 C)  TempSrc:      Resp: 16 16 16 16   Height:      Weight:      SpO2: 99% 98% 99% 99%   I&O's:   Intake/Output Summary (Last 24 hours) at 11/08/13 0745 Last data filed at 11/08/13 0606  Gross per 24 hour  Intake 845.56 ml  Output   1450 ml  Net -604.44 ml   TELEMETRY: Reviewed telemetry pt in A paced rhythm, V sensing:     PHYSICAL EXAM General: Well developed, well nourished, intubated, sedated Head:  Normal cephalic and atramatic  Lungs:  Coarse breath sounds bilaterally Heart:   HRRR S1 S2 Abdomen:  abdomen soft and non-tender  Extremities:  Bilateral ankle edema.   Neuro:  intubated, sedated Psych:   intubated, sedated   LABS: Basic Metabolic Panel:  Recent Labs  16/07/9600/12/15 0444 11/07/13 0456 11/08/13 0340  NA 137 137 138  K 5.0 4.8 4.4  CL 101 106 104  CO2 20  --  21  GLUCOSE 226* 228* 121*  BUN 40* 38* 41*  CREATININE 2.66* 2.70* 2.80*  CALCIUM 9.0  --  8.2*  MG  --   --  2.5  PHOS  --   --  3.9   Liver Function Tests: No results found for this basename: AST, ALT, ALKPHOS, BILITOT, PROT, ALBUMIN,  in the last 72 hours No results found for this basename: LIPASE, AMYLASE,  in the last 72 hours CBC:  Recent Labs  11/07/13 0444 11/07/13 0456 11/08/13 0340  WBC 11.4*  --  5.4  NEUTROABS 7.1  --   --   HGB 11.5* 12.6* 9.4*  HCT 33.4* 37.0* 28.0*  MCV 95.2  --  90.3  PLT 247  --  202   Cardiac Enzymes:  Recent Labs  11/07/13 0444 11/07/13 1240 11/07/13 1808  TROPONINI <0.30 >20.00* >20.00*   BNP: No components found with this basename: POCBNP,  D-Dimer: No results found for this basename: DDIMER,  in the last 72 hours Hemoglobin A1C: No results found for this basename: HGBA1C,  in  the last 72 hours Fasting Lipid Panel:  Recent Labs  11/07/13 1240  TRIG 74   Thyroid Function Tests: No results found for this basename: TSH, T4TOTAL, FREET3, T3FREE, THYROIDAB,  in the last 72 hours Anemia Panel: No results found for this basename: VITAMINB12, FOLATE, FERRITIN, TIBC, IRON, RETICCTPCT,  in the last 72 hours Coag Panel:   Lab Results  Component Value Date   INR 1.10 11/07/2013   INR 1.1 05/01/2009   INR 1.0 04/30/2009    RADIOLOGY: Dg Chest Port 1 View  11/08/2013   CLINICAL DATA:  Pneumonia.  EXAM: PORTABLE CHEST - 1 VIEW  COMPARISON:  11/07/2013.  FINDINGS: Endotracheal tube and NG tube in stable position. Cardiomegaly with normal pulmonary vascularity. Pacemaker noted in stable position. Pulmonary infiltrate on the right is improved slightly. Persistent infiltrate remains. Small pleural effusions and left base atelectasis appear to be present.  IMPRESSION: 1. Improving but persistent right lung infiltrate. Small bilateral pleural effusions and mild left base atelectasis.  2.  Stable line and tube positions.   Electronically Signed   By: Maisie Fushomas  Register  On: 11/08/2013 07:09   Dg Chest Port 1 View  11/07/2013   CLINICAL DATA:  Respiratory distress, intubation.  EXAM: PORTABLE CHEST - 1 VIEW  COMPARISON:  Chest radiograph October 05, 2013  FINDINGS: Cardiac silhouette remains moderately enlarged, tortuous, mildly ectatic calcified aorta. Right greater than left lung patchy airspace opacities in a background of increasing interstitial prominence and new subsegmental atelectasis. No pleural effusions. No pneumothorax. Mild central pulmonary vasculature congestion.  Endotracheal tube tip projects 5.6 cm above the carina. Dual lead left cardiac pacemaker in situ. Multiple EKG lines overlie the patient and may obscure subtle underlying pathology. soft tissue planes and included osseous structures are nonsuspicious.  IMPRESSION: Stable cardiomegaly, interval increase atelectasis,  interstitial and alveolar airspace opacities, which may reflect pulmonary edema or pneumonia.   Electronically Signed   By: Awilda Metro   On: 11/07/2013 05:23   Dg Abd Portable 1v  11/07/2013   CLINICAL DATA:  80 year old male status post NG tube placement. Initial encounter.  EXAM: PORTABLE ABDOMEN - 1 VIEW  COMPARISON:  Upper GI series 329 2006.  FINDINGS: Portable AP supine view at 1428 hrs. Enteric tube in place, side hole projects at the level of the distal thoracic esophagus or gastroesophageal junction. The tip of the tube is at the level of the gastric cardia. Visualized bowel gas pattern is non obstructed. Cardiac pacemaker leads partially visible. Degenerative changes in the spine.  IMPRESSION: NG tube tip at the proximal stomach. Advance 8 cm for more optimal placement.   Electronically Signed   By: Augusto Gamble M.D.   On: 11/07/2013 14:43      ASSESSMENT: NSTEMI, CVA  PLAN:  Agree with prior cardiac recommendations.  He is not a candidate for invasive testing due to risks of contrast and anticoagulation.  COntinue medical therapy.  Await echocardiogram.  COntinue to diurese as needed to get him off of the vent.  Vascular congestion noted on CXR.  Continue beta blocker, statin, aspirin.  No family in teh room.  Await echo for LVEF result.  Corky Crafts., MD  11/08/2013  7:45 AM

## 2013-11-09 ENCOUNTER — Inpatient Hospital Stay (HOSPITAL_COMMUNITY): Payer: Medicare Other

## 2013-11-09 DIAGNOSIS — Z515 Encounter for palliative care: Secondary | ICD-10-CM

## 2013-11-09 DIAGNOSIS — N184 Chronic kidney disease, stage 4 (severe): Secondary | ICD-10-CM

## 2013-11-09 LAB — GLUCOSE, CAPILLARY
GLUCOSE-CAPILLARY: 112 mg/dL — AB (ref 70–99)
GLUCOSE-CAPILLARY: 139 mg/dL — AB (ref 70–99)
GLUCOSE-CAPILLARY: 149 mg/dL — AB (ref 70–99)
Glucose-Capillary: 125 mg/dL — ABNORMAL HIGH (ref 70–99)
Glucose-Capillary: 157 mg/dL — ABNORMAL HIGH (ref 70–99)
Glucose-Capillary: 130 mg/dL — ABNORMAL HIGH (ref 70–99)

## 2013-11-09 LAB — BASIC METABOLIC PANEL
BUN: 48 mg/dL — AB (ref 6–23)
CHLORIDE: 103 meq/L (ref 96–112)
CO2: 24 mEq/L (ref 19–32)
Calcium: 8.5 mg/dL (ref 8.4–10.5)
Creatinine, Ser: 2.88 mg/dL — ABNORMAL HIGH (ref 0.50–1.35)
GFR calc Af Amer: 20 mL/min — ABNORMAL LOW (ref >90)
GFR, EST NON AFRICAN AMERICAN: 18 mL/min — AB (ref >90)
GLUCOSE: 153 mg/dL — AB (ref 70–99)
POTASSIUM: 4.3 meq/L (ref 3.7–5.3)
SODIUM: 141 meq/L (ref 137–147)

## 2013-11-09 LAB — CBC
HEMATOCRIT: 30.4 % — AB (ref 39.0–52.0)
HEMOGLOBIN: 10.3 g/dL — AB (ref 13.0–17.0)
MCH: 30.7 pg (ref 26.0–34.0)
MCHC: 33.9 g/dL (ref 30.0–36.0)
MCV: 90.5 fL (ref 78.0–100.0)
Platelets: 235 10*3/uL (ref 150–400)
RBC: 3.36 MIL/uL — AB (ref 4.22–5.81)
RDW: 13.8 % (ref 11.5–15.5)
WBC: 7.1 10*3/uL (ref 4.0–10.5)

## 2013-11-09 LAB — TROPONIN I: TROPONIN I: 5.19 ng/mL — AB (ref <0.30)

## 2013-11-09 MED ORDER — ASPIRIN-DIPYRIDAMOLE ER 25-200 MG PO CP12
1.0000 | ORAL_CAPSULE | Freq: Two times a day (BID) | ORAL | Status: DC
  Administered 2013-11-09 – 2013-11-19 (×20): 1 via ORAL
  Filled 2013-11-09 (×22): qty 1

## 2013-11-09 NOTE — Progress Notes (Addendum)
Name: [name] MRN: [id number] DOB: 28-Oct-1918    ADMISSION DATE:  11/07/2013 CONSULTATION DATE:  11/09/2013  REFERRING MD :  [facility] PRIMARY SERVICE: PCCM  CHIEF COMPLAINT:  resp distress  BRIEF PATIENT DESCRIPTION: 80 y.o. BIBEMS to [facility] in respiratory distress & hypoxic, poor mental status, required intubation, CXR shows R infiltrates PMH - diastolic heart failure, coronary artery disease s/p PPM, 3.7 cm (april 2012 ) abdominal aortic aneurysm, recent adm 09/2013 for extension of his old right CVA -aspirin changed to aggrenox (allergy to plavix) Echo and pacemaker check did not support a cardioembolic source . Pos trop > 20    SIGNIFICANT EVENTS / STUDIES:  1/12 cards consult >> no heparin 1/13: ECHO: EF 55-60%, gd I D dysfxn, AV restricted w/ mild stenosis, Mild MVR, LA dilated, mild-mod PAH  LINES / TUBES: ETT 1/12 >>1/14  CULTURES: resp 1/12 >> bld 1/12 >>ng Rapid flu >>neg Urine strep >>neg  ANTIBIOTICS: 1/12 zosyn >> 1/12 vanc >>1/14  SUBJECTIVE:VITAL SIGNS: Temp:  [95.4 F (35.2 C)-99.5 F (37.5 C)] 98.8 F (37.1 C) (01/14 0700) Pulse Rate:  [49-100] 100 (01/14 0700) Resp:  [11-25] 19 (01/14 0700) BP: (121-203)/(63-98) 159/84 mmHg (01/14 0700) SpO2:  [95 %-100 %] 96 % (01/14 0700) FiO2 (%):  [40 %] 40 % (01/14 0807) Weight:  [85.3 kg (188 lb 0.8 oz)] 85.3 kg (188 lb 0.8 oz) (01/14 0422) HEMODYNAMICS:   VENTILATOR SETTINGS: Vent Mode:  [-] CPAP FiO2 (%):  [40 %] 40 % Set Rate:  [16 bmp] 16 bmp Vt Set:  [500 mL] 500 mL PEEP:  [5 cmH20] 5 cmH20 Pressure Support:  [5 cmH20] 5 cmH20 Plateau Pressure:  [13 cmH20-18 cmH20] 17 cmH20 INTAKE / OUTPUT: Intake/Output     01/13 0701 - 01/14 0700 01/14 0701 - 01/15 0700   I.V. (mL/kg) 35.4 (0.4)    NG/GT 1251.5    IV Piggyback 350    Total Intake(mL/kg) 1636.9 (19.2)    Urine (mL/kg/hr) 3350 (1.6)    Total Output 3350     Net -1713.1            PHYSICAL EXAMINATION: Gen. elderly,  well-nourished, in no distress, mechanics look excellent on SBT  ENT - no lesions, no post nasal drip Lungs: no use of accessory muscles, no dullness to percussion, clear without rales or rhonchi, no excess WOB on SBT  Cardiovascular: Rhythm paced on monitor, IV/VI systolic murmur also + AS,  no peripheral edema Abdomen: soft and non-tender, no hepatosplenomegaly, BS normal. Musculoskeletal: No deformities, no cyanosis or clubbing Neuro: off sedation, weaker on left 4/5 Skin:  Warm, no lesions/ rash   LABS:  CBC  Recent Labs Lab 11/07/13 0444 11/07/13 0456 11/08/13 0340 11/09/13 0346  WBC 11.4*  --  5.4 7.1  HGB 11.5* 12.6* 9.4* 10.3*  HCT 33.4* 37.0* 28.0* 30.4*  PLT 247  --  202 235   Coag's  Recent Labs Lab 11/07/13 0444  APTT 24  INR 1.10   BMET  Recent Labs Lab 11/07/13 0444 11/07/13 0456 11/08/13 0340 11/09/13 0346  NA 137 137 138 141  K 5.0 4.8 4.4 4.3  CL 101 106 104 103  CO2 20  --  21 24  BUN 40* 38* 41* 48*  CREATININE 2.66* 2.70* 2.80* 2.88*  GLUCOSE 226* 228* 121* 153*   Electrolytes  Recent Labs Lab 11/07/13 0444 11/08/13 0340 11/09/13 0346  CALCIUM 9.0 8.2* 8.5  MG  --  2.5  --  PHOS  --  3.9  --    Sepsis Markers  Recent Labs Lab 11/07/13 0600 11/07/13 1216  LATICACIDVEN 1.4  --   PROCALCITON  --  1.36   ABG  Recent Labs Lab 11/07/13 0510 11/07/13 1228  PHART 7.213* 7.430  PCO2ART 48.1* 31.6*  PO2ART 276.0* 78.7*   Liver Enzymes No results found for this basename: AST, ALT, ALKPHOS, BILITOT, ALBUMIN,  in the last 168 hours Cardiac Enzymes  Recent Labs Lab 11/07/13 0444 11/07/13 1240 11/07/13 1808 11/09/13 0346  TROPONINI <0.30 >20.00* >20.00* 5.19*  PROBNP 8303.0*  --   --   --    Glucose  Recent Labs Lab 11/08/13 1129 11/08/13 1611 11/08/13 1947 11/09/13 0104 11/09/13 0413 11/09/13 0751  GLUCAP 112* 114* 128* 112* 125* 157*    Imaging Dg Chest Port 1 View  11/09/2013   CLINICAL DATA:   Pneumonia versus CHF.  ETT positioning.  EXAM: PORTABLE CHEST - 1 VIEW  COMPARISON:  Chest x-ray from yesterday.  FINDINGS: Endotracheal tube ends in the mid thoracic trachea. An orogastric tube continues into the stomach.  Stable cardiomegaly. Distorted mediastinal contours at least partly due to rightward rotation. Hazy appearance of the lower chest suggest pleural fluid or atelectasis. The apical lungs argues against pulmonary edema.  IMPRESSION: 1. Endotracheal and orogastric tubes in good position. 2. Similar appearance of bibasilar opacities, pleural effusions or atelectasis at least. 3. Cardiomegaly and aortic tortuosity or enlargement. Evaluation of the mediastinum limited by rotation.   Electronically Signed   By: Tiburcio Pea M.D.   On: 11/09/2013 05:38   Dg Chest Port 1 View  11/08/2013   CLINICAL DATA:  Pneumonia.  EXAM: PORTABLE CHEST - 1 VIEW  COMPARISON:  11/07/2013.  FINDINGS: Endotracheal tube and NG tube in stable position. Cardiomegaly with normal pulmonary vascularity. Pacemaker noted in stable position. Pulmonary infiltrate on the right is improved slightly. Persistent infiltrate remains. Small pleural effusions and left base atelectasis appear to be present.  IMPRESSION: 1. Improving but persistent right lung infiltrate. Small bilateral pleural effusions and mild left base atelectasis.  2.  Stable line and tube positions.   Electronically Signed   By: Maisie Fus  Register   On: 11/08/2013 07:09   Dg Abd Portable 1v  11/07/2013   CLINICAL DATA:  80 year old male status post NG tube placement. Initial encounter.  EXAM: PORTABLE ABDOMEN - 1 VIEW  COMPARISON:  Upper GI series 329 2006.  FINDINGS: Portable AP supine view at 1428 hrs. Enteric tube in place, side hole projects at the level of the distal thoracic esophagus or gastroesophageal junction. The tip of the tube is at the level of the gastric cardia. Visualized bowel gas pattern is non obstructed. Cardiac pacemaker leads partially  visible. Degenerative changes in the spine.  IMPRESSION: NG tube tip at the proximal stomach. Advance 8 cm for more optimal placement.   Electronically Signed   By: Augusto Gamble M.D.   On: 11/07/2013 14:43     CXR: Rt >> Lt ASD -improved 1/14, prob element of effusion and atx   ASSESSMENT / PLAN:  PULMONARY A: Acute resp failure Favor aspiration pneumonia in setting of recent CVA >CXR improved. MS and SBT support extubation   P:   Extubate to [place]  DNI after  Pulm hygiene  NPO w/ plan to ck swallow eval    CARDIOVASCULAR A: Diastolic dysfucntion, mild AS-Echocardiography and pacemaker check 09/2013 did not support a cardioembolic source of CVA NSTEMi - not a candidate for  invasive intervention, high risk heparin, CEs trending down  ECHO: EF intact, gd I d dsyfxn  P:  Ct cardizem 60 tid & lopressor 50 bid Med rx per cards   RENAL A:  AKI on CKD -baseline cr 2.1 in 09/2013, scr cont to rise  P:   Hold lasix  F/u chem   GASTROINTESTINAL A:  No issues P:   Will need swallow eval once extubated, otherwise resume TFs  HEMATOLOGIC A:  No issues P:  Follow WBC  INFECTIOUS A:  Aspiration pna, recent hospitalisation P:   Empiric zosyn/dc  vanc await resp cx data - simplify rapidly  ENDOCRINE A:  No isues    P:   SSI  NEUROLOGIC A:  Recent extension of old rt MCA-CVA- MRI not done due to PPM P:   Ct aggrenox    BABCOCK,PETE  TODAY'S SUMMARY: Favor aspn pneumonia rather than CHF as a cause of his symptoms, complicated by NSTEMI,  he indicated would not want reintubation -discussed with family present.  DNR issued  I have personally obtained a history, examined the patient, evaluated laboratory and imaging results, formulated the assessment and plan and placed orders. CRITICAL CARE: The patient is critically ill with multiple organ systems failure and requires high complexity decision making for assessment and support, frequent evaluation and titration of therapies,  application of advanced monitoring technologies and extensive interpretation of multiple databases. Critical Care Time devoted to patient care services described in this note is 45 minutes.  Makita Blow V. [phone]  11/09/2013, 8:17 AM

## 2013-11-09 NOTE — Progress Notes (Signed)
SUBJECTIVE:  No new events.  Diuresed well.  OBJECTIVE:   Vitals:   Filed Vitals:   11/09/13 0422 11/09/13 0500 11/09/13 0600 11/09/13 0700  BP:  121/66 177/79 159/84  Pulse: 66 64 68 100  Temp: 99.3 F (37.4 C) 99.3 F (37.4 C) 99.1 F (37.3 C) 98.8 F (37.1 C)  TempSrc:      Resp: 17 16 16 19   Height:      Weight: 188 lb 0.8 oz (85.3 kg)     SpO2: 98% 97% 98% 96%   I&O's:    Intake/Output Summary (Last 24 hours) at 11/09/13 0841 Last data filed at 11/09/13 0700  Gross per 24 hour  Intake 1537.7 ml  Output   3350 ml  Net -1812.3 ml   TELEMETRY: Reviewed telemetry pt in A paced rhythm, V sensing:     PHYSICAL EXAM General: Well developed, well nourished, intubated, sedated Head:  Normal cephalic and atramatic  Lungs:  Coarse breath sounds bilaterally Heart:   HRRR S1 S2 Abdomen:  abdomen soft and non-tender  Extremities:  Bilateral ankle edema.   Neuro:  intubated, sedated Psych:   intubated, sedated   LABS: Basic Metabolic Panel:  Recent Labs  93/96/88 0340 11/09/13 0346  NA 138 141  K 4.4 4.3  CL 104 103  CO2 21 24  GLUCOSE 121* 153*  BUN 41* 48*  CREATININE 2.80* 2.88*  CALCIUM 8.2* 8.5  MG 2.5  --   PHOS 3.9  --    Liver Function Tests: No results found for this basename: AST, ALT, ALKPHOS, BILITOT, PROT, ALBUMIN,  in the last 72 hours No results found for this basename: LIPASE, AMYLASE,  in the last 72 hours CBC:  Recent Labs  11/07/13 0444  11/08/13 0340 11/09/13 0346  WBC 11.4*  --  5.4 7.1  NEUTROABS 7.1  --   --   --   HGB 11.5*  < > 9.4* 10.3*  HCT 33.4*  < > 28.0* 30.4*  MCV 95.2  --  90.3 90.5  PLT 247  --  202 235  < > = values in this interval not displayed. Cardiac Enzymes:  Recent Labs  11/07/13 1240 11/07/13 1808 11/09/13 0346  TROPONINI >20.00* >20.00* 5.19*   BNP: No components found with this basename: POCBNP,  D-Dimer: No results found for this basename: DDIMER,  in the last 72 hours Hemoglobin  A1C: No results found for this basename: HGBA1C,  in the last 72 hours Fasting Lipid Panel:  Recent Labs  11/07/13 1240  TRIG 74   Thyroid Function Tests: No results found for this basename: TSH, T4TOTAL, FREET3, T3FREE, THYROIDAB,  in the last 72 hours Anemia Panel: No results found for this basename: VITAMINB12, FOLATE, FERRITIN, TIBC, IRON, RETICCTPCT,  in the last 72 hours Coag Panel:   Lab Results  Component Value Date   INR 1.10 11/07/2013   INR 1.1 05/01/2009   INR 1.0 04/30/2009    RADIOLOGY: Dg Chest Port 1 View  11/08/2013   CLINICAL DATA:  Pneumonia.  EXAM: PORTABLE CHEST - 1 VIEW  COMPARISON:  11/07/2013.  FINDINGS: Endotracheal tube and NG tube in stable position. Cardiomegaly with normal pulmonary vascularity. Pacemaker noted in stable position. Pulmonary infiltrate on the right is improved slightly. Persistent infiltrate remains. Small pleural effusions and left base atelectasis appear to be present.  IMPRESSION: 1. Improving but persistent right lung infiltrate. Small bilateral pleural effusions and mild left base atelectasis.  2.  Stable line and tube positions.  Electronically Signed   By: Maisie Fushomas  Register   On: 11/08/2013 07:09   Dg Chest Port 1 View  11/07/2013   CLINICAL DATA:  Respiratory distress, intubation.  EXAM: PORTABLE CHEST - 1 VIEW  COMPARISON:  Chest radiograph October 05, 2013  FINDINGS: Cardiac silhouette remains moderately enlarged, tortuous, mildly ectatic calcified aorta. Right greater than left lung patchy airspace opacities in a background of increasing interstitial prominence and new subsegmental atelectasis. No pleural effusions. No pneumothorax. Mild central pulmonary vasculature congestion.  Endotracheal tube tip projects 5.6 cm above the carina. Dual lead left cardiac pacemaker in situ. Multiple EKG lines overlie the patient and may obscure subtle underlying pathology. soft tissue planes and included osseous structures are nonsuspicious.  IMPRESSION:  Stable cardiomegaly, interval increase atelectasis, interstitial and alveolar airspace opacities, which may reflect pulmonary edema or pneumonia.   Electronically Signed   By: Awilda Metroourtnay  Bloomer   On: 11/07/2013 05:23   Dg Abd Portable 1v  11/07/2013   CLINICAL DATA:  80 year old male status post NG tube placement. Initial encounter.  EXAM: PORTABLE ABDOMEN - 1 VIEW  COMPARISON:  Upper GI series 329 2006.  FINDINGS: Portable AP supine view at 1428 hrs. Enteric tube in place, side hole projects at the level of the distal thoracic esophagus or gastroesophageal junction. The tip of the tube is at the level of the gastric cardia. Visualized bowel gas pattern is non obstructed. Cardiac pacemaker leads partially visible. Degenerative changes in the spine.  IMPRESSION: NG tube tip at the proximal stomach. Advance 8 cm for more optimal placement.   Electronically Signed   By: Augusto GambleLee  Hall M.D.   On: 11/07/2013 14:43      ASSESSMENT: NSTEMI, CVA  PLAN:  Agree with prior cardiac recommendations.  He is not a candidate for invasive testing due to risks of contrast and anticoagulation.  Continue medical therapy.   Continue to diurese as needed to get him off of the vent.  Vascular congestion noted on CXR.    Continue beta blocker, statin, aspirin.  No family in the room.  Echocardiogram result reviewed. Overall ejection fraction is well preserved. There is apical hypokinesis which could be related to his infarction vs. Pacemaker.  His heart rhythm and blood pressure have been stable.  Would not pursue invasive management. Hopefully, he'll be with extubated later today.  Corky CraftsVARANASI,Llesenia Fogal S., MD  11/09/2013  8:41 AM

## 2013-11-09 NOTE — Progress Notes (Signed)
[facility]  Patient Status: Active (receiving services up to time of hospitalization)  [facility] is providing the following services: RN and PT  If patient discharges after hours, please call [phone].   Sherryll Burger 11/09/2013, 1:57 PM

## 2013-11-09 NOTE — Evaluation (Signed)
SLP Cancellation Note  Patient Details Name: [name] MRN: [id number] DOB: February 13, 1919   Cancelled treatment:       Reason Eval/Treat Not Completed: Medical issues which prohibited therapy (pt just extubated today and with suspected aspiration event at home with thin liquids, pt has NG currently for nutritioin, will defer clinical swallow evaluation until next date per SLP discussion with MD).  Pt may benefit from instrumental evaluation if indicated after clinical testing due to known h/o dysphagia from CVA - dating back to 2005.    Donavan Burnet, MS [facility] SLP [phone]

## 2013-11-09 NOTE — Procedures (Signed)
Extubation Procedure Note  Patient Details:   Name: [name] DOB: Jul 22, 1919 MRN: [id number]   Airway Documentation:   Pt extubated to 4L [place] per MD order.  Evaluation  O2 sats: stable throughout Complications: No apparent complications Patient did tolerate procedure well. Bilateral Breath Sounds: Diminished Suctioning: Oral;Airway Yes  Kendall Flack Royal 11/09/2013, 9:18 AM

## 2013-11-10 ENCOUNTER — Inpatient Hospital Stay (HOSPITAL_COMMUNITY): Payer: Medicare Other

## 2013-11-10 DIAGNOSIS — I5032 Chronic diastolic (congestive) heart failure: Secondary | ICD-10-CM

## 2013-11-10 LAB — CULTURE, RESPIRATORY W GRAM STAIN

## 2013-11-10 LAB — GLUCOSE, CAPILLARY
GLUCOSE-CAPILLARY: 123 mg/dL — AB (ref 70–99)
GLUCOSE-CAPILLARY: 113 mg/dL — AB (ref 70–99)
Glucose-Capillary: 106 mg/dL — ABNORMAL HIGH (ref 70–99)
Glucose-Capillary: 119 mg/dL — ABNORMAL HIGH (ref 70–99)
Glucose-Capillary: 129 mg/dL — ABNORMAL HIGH (ref 70–99)
Glucose-Capillary: 143 mg/dL — ABNORMAL HIGH (ref 70–99)
Glucose-Capillary: 146 mg/dL — ABNORMAL HIGH (ref 70–99)

## 2013-11-10 LAB — CLOSTRIDIUM DIFFICILE BY PCR: Toxigenic C. Difficile by PCR: NEGATIVE

## 2013-11-10 LAB — VANCOMYCIN, RANDOM: Vancomycin Rm: 15.2 ug/mL

## 2013-11-10 LAB — CULTURE, RESPIRATORY

## 2013-11-10 MED ORDER — SODIUM CHLORIDE 0.9 % IV SOLN
3.0000 g | Freq: Two times a day (BID) | INTRAVENOUS | Status: DC
  Administered 2013-11-10 – 2013-11-13 (×6): 3 g via INTRAVENOUS
  Filled 2013-11-10 (×7): qty 3

## 2013-11-10 MED ORDER — VITAL 1.5 CAL PO LIQD
1000.0000 mL | ORAL | Status: DC
  Filled 2013-11-10 (×3): qty 1000

## 2013-11-10 NOTE — Progress Notes (Addendum)
SUBJECTIVE:  No new events.  Diuresed well.  Extubated yesterday.  OBJECTIVE:   Vitals:   Filed Vitals:   11/10/13 0800 11/10/13 0900 11/10/13 1000 11/10/13 1100  BP: 175/86 172/89 148/78 165/82  Pulse: 79 67 62 59  Temp: 98.6 F (37 C) 98.4 F (36.9 C) 98.8 F (37.1 C) 99 F (37.2 C)  TempSrc:      Resp: 21 16 19 23   Height:      Weight:      SpO2: 97% 96% 99% 97%   I&O's:    Intake/Output Summary (Last 24 hours) at 11/10/13 1243 Last data filed at 11/10/13 1200  Gross per 24 hour  Intake   1635 ml  Output   1625 ml  Net     10 ml   TELEMETRY: Reviewed telemetry pt in A paced rhythm, V sensing:     PHYSICAL EXAM General: Well developed, well nourished, intubated, sedated Head:  Normal cephalic and atramatic  Lungs:  Coarse breath sounds bilaterally Heart:   HRRR S1 S2 Abdomen:  abdomen soft and non-tender  Extremities:  Bilateral ankle edema.   Neuro:  intubated, sedated Psych:   intubated, sedated   LABS: Basic Metabolic Panel:  Recent Labs  29/56/2101/13/15 0340 11/09/13 0346  NA 138 141  K 4.4 4.3  CL 104 103  CO2 21 24  GLUCOSE 121* 153*  BUN 41* 48*  CREATININE 2.80* 2.88*  CALCIUM 8.2* 8.5  MG 2.5  --   PHOS 3.9  --    Liver Function Tests: No results found for this basename: AST, ALT, ALKPHOS, BILITOT, PROT, ALBUMIN,  in the last 72 hours No results found for this basename: LIPASE, AMYLASE,  in the last 72 hours CBC:  Recent Labs  11/08/13 0340 11/09/13 0346  WBC 5.4 7.1  HGB 9.4* 10.3*  HCT 28.0* 30.4*  MCV 90.3 90.5  PLT 202 235   Cardiac Enzymes:  Recent Labs  11/07/13 1808 11/09/13 0346  TROPONINI >20.00* 5.19*   BNP: No components found with this basename: POCBNP,  D-Dimer: No results found for this basename: DDIMER,  in the last 72 hours Hemoglobin A1C: No results found for this basename: HGBA1C,  in the last 72 hours Fasting Lipid Panel: No results found for this basename: CHOL, HDL, LDLCALC, TRIG, CHOLHDL,  LDLDIRECT,  in the last 72 hours Thyroid Function Tests: No results found for this basename: TSH, T4TOTAL, FREET3, T3FREE, THYROIDAB,  in the last 72 hours Anemia Panel: No results found for this basename: VITAMINB12, FOLATE, FERRITIN, TIBC, IRON, RETICCTPCT,  in the last 72 hours Coag Panel:   Lab Results  Component Value Date   INR 1.10 11/07/2013   INR 1.1 05/01/2009   INR 1.0 04/30/2009    RADIOLOGY: Dg Chest Port 1 View  11/08/2013   CLINICAL DATA:  Pneumonia.  EXAM: PORTABLE CHEST - 1 VIEW  COMPARISON:  11/07/2013.  FINDINGS: Endotracheal tube and NG tube in stable position. Cardiomegaly with normal pulmonary vascularity. Pacemaker noted in stable position. Pulmonary infiltrate on the right is improved slightly. Persistent infiltrate remains. Small pleural effusions and left base atelectasis appear to be present.  IMPRESSION: 1. Improving but persistent right lung infiltrate. Small bilateral pleural effusions and mild left base atelectasis.  2.  Stable line and tube positions.   Electronically Signed   By: Maisie Fushomas  Register   On: 11/08/2013 07:09   Dg Chest Port 1 View  11/07/2013   CLINICAL DATA:  Respiratory distress, intubation.  EXAM: PORTABLE  CHEST - 1 VIEW  COMPARISON:  Chest radiograph October 05, 2013  FINDINGS: Cardiac silhouette remains moderately enlarged, tortuous, mildly ectatic calcified aorta. Right greater than left lung patchy airspace opacities in a background of increasing interstitial prominence and new subsegmental atelectasis. No pleural effusions. No pneumothorax. Mild central pulmonary vasculature congestion.  Endotracheal tube tip projects 5.6 cm above the carina. Dual lead left cardiac pacemaker in situ. Multiple EKG lines overlie the patient and may obscure subtle underlying pathology. soft tissue planes and included osseous structures are nonsuspicious.  IMPRESSION: Stable cardiomegaly, interval increase atelectasis, interstitial and alveolar airspace opacities, which  may reflect pulmonary edema or pneumonia.   Electronically Signed   By: Awilda Metro   On: 11/07/2013 05:23   Dg Abd Portable 1v  11/07/2013   CLINICAL DATA:  80 year old male status post NG tube placement. Initial encounter.  EXAM: PORTABLE ABDOMEN - 1 VIEW  COMPARISON:  Upper GI series 329 2006.  FINDINGS: Portable AP supine view at 1428 hrs. Enteric tube in place, side hole projects at the level of the distal thoracic esophagus or gastroesophageal junction. The tip of the tube is at the level of the gastric cardia. Visualized bowel gas pattern is non obstructed. Cardiac pacemaker leads partially visible. Degenerative changes in the spine.  IMPRESSION: NG tube tip at the proximal stomach. Advance 8 cm for more optimal placement.   Electronically Signed   By: Augusto Gamble M.D.   On: 11/07/2013 14:43      ASSESSMENT: NSTEMI, CVA  PLAN:  Agree with prior cardiac recommendations.  He is not a candidate for invasive testing due to risks of contrast and anticoagulation.  Continue medical therapy.   Continue to diurese as needed .  Following commands. Able answer questions. He does not report any chest discomfort.   Continue beta blocker, statin, aspirin.  No family in the room.  Left message on wife's cell phone.  Echocardiogram result reviewed. Overall ejection fraction is well preserved. There is apical hypokinesis which could be related to his infarction vs. Pacemaker.  His heart rhythm and blood pressure have been stable.  Would not pursue invasive management.   Corky Crafts., MD  11/10/2013  12:43 PM

## 2013-11-10 NOTE — Progress Notes (Signed)
CARE MANAGEMENT NOTE 11/10/2013  Patient:  [name], [name]   Account Number:  [id number]  Date Initiated:  11/07/2013  Documentation initiated by:  DAVIS,RHONDA  Subjective/Objective Assessment:   acute resp. failure and intubation     Action/Plan:   tbd   Anticipated DC Date:  11/13/2013   Anticipated DC Plan:  HOME/SELF CARE  In-house referral  NA      DC Planning Services  NA      PAC Choice  NA   Choice offered to / List presented to:  NA   DME arranged  NA      DME agency  NA     HH arranged  NA      HH agency  NA   Status of service:  In process, will continue to follow Medicare Important Message given?  NA - LOS <3 / Initial given by admissions (If response is "NO", the following Medicare IM given date fields will be blank) Date Medicare IM given:   Date Additional Medicare IM given:    Discharge Disposition:    Per UR Regulation:  Reviewed for med. necessity/level of care/duration of stay  If discussed at Long Length of Stay Meetings, dates discussed:    Comments:  [id number]/Rhonda Stark Jock, BSN, [place] [phone] Chart Reviewed for discharge and hospital needs.  NSTEMI on admit, troponin remains elevated, has hx of cva's and pacemaker. Patient was extubated from vent on [id number], tolerated well.  Patient is progressing. Discharge needs at time of review:  None present will follow for needs. Review of patient progress due on [id number].   Rhonda Davis,RN,BSN,CCM

## 2013-11-10 NOTE — Progress Notes (Signed)
ANTIBIOTIC CONSULT NOTE - FOLLOW UP  Pharmacy Consult for vancomycin/zosyn Indication: Aspiration pneumonia  Allergies  Allergen Reactions  . Lasix [Furosemide] Itching    Scalp broke out  . Bystolic [Nebivolol Hcl] Rash  . Plavix [Clopidogrel Bisulfate] Rash    Patient Measurements: Height: 6' (182.9 cm) Weight: 182 lb 1.6 oz (82.6 kg) IBW/kg (Calculated) : 77.6 Adjusted Body Weight:   Vital Signs: Temp: 98.6 F (37 C) (01/15 0800) Temp src: Core (Comment) (01/15 0400) BP: 175/86 mmHg (01/15 0800) Pulse Rate: 79 (01/15 0800) Intake/Output from previous day: 01/14 0701 - 01/15 0700 In: 1465 [I.V.:110; NG/GT:1155; IV Piggyback:200] Out: 1375 [Urine:1375] Intake/Output from this shift: Total I/O In: 190 [I.V.:20; NG/GT:170] Out: -   Labs:  Recent Labs  11/08/13 0340 11/09/13 0346  WBC 5.4 7.1  HGB 9.4* 10.3*  PLT 202 235  CREATININE 2.80* 2.88*   Estimated Creatinine Clearance: 18 ml/min (by C-G formula based on Cr of 2.88).  Recent Labs  11/08/13 0340 11/10/13 0345  VANCORANDOM 19.8 15.2     Microbiology: Recent Results (from the past 720 hour(s))  MRSA PCR SCREENING     Status: None   Collection Time    11/07/13 12:38 PM      Result Value Range Status   MRSA by PCR NEGATIVE  NEGATIVE Final   Comment:            The GeneXpert MRSA Assay (FDA     approved for NASAL specimens     only), is one component of a     comprehensive MRSA colonization     surveillance program. It is not     intended to diagnose MRSA     infection nor to guide or     monitor treatment for     MRSA infections.  CULTURE, RESPIRATORY (NON-EXPECTORATED)     Status: None   Collection Time    11/07/13 12:39 PM      Result Value Range Status   Specimen Description TRACHEAL ASPIRATE   Final   Special Requests NONE   Final   Gram Stain     Final   Value: MODERATE WBC PRESENT,BOTH PMN AND MONONUCLEAR     RARE SQUAMOUS EPITHELIAL CELLS PRESENT     RARE GRAM POSITIVE COCCI   IN PAIRS     Performed at [facility]   Culture     Final   Value: Non-Pathogenic Oropharyngeal-type Flora Isolated.     Performed at [facility]   Report Status 11/10/2013 FINAL   Final  CULTURE, BLOOD (ROUTINE X 2)     Status: None   Collection Time    11/07/13  1:25 PM      Result Value Range Status   Specimen Description BLOOD LEFT HAND   Final   Special Requests BOTTLES DRAWN AEROBIC ONLY 3CC   Final   Culture  Setup Time     Final   Value: 11/07/2013 16:02     Performed at [facility]   Culture     Final   Value:        BLOOD CULTURE RECEIVED NO GROWTH TO DATE CULTURE WILL BE HELD FOR 5 DAYS BEFORE ISSUING A FINAL NEGATIVE REPORT     Performed at [facility]   Report Status PENDING   Incomplete  CULTURE, BLOOD (ROUTINE X 2)     Status: None   Collection Time    11/07/13  1:30 PM      Result Value  Range Status   Specimen Description BLOOD LEFT HAND   Final   Special Requests BOTTLES DRAWN AEROBIC AND ANAEROBIC 5CC   Final   Culture  Setup Time     Final   Value: 11/07/2013 16:03     Performed at [facility]   Culture     Final   Value:        BLOOD CULTURE RECEIVED NO GROWTH TO DATE CULTURE WILL BE HELD FOR 5 DAYS BEFORE ISSUING A FINAL NEGATIVE REPORT     Performed at [facility]   Report Status PENDING   Incomplete    Anti-infectives   Start     Dose/Rate Route Frequency Ordered Stop   11/08/13 1200  vancomycin (VANCOCIN) IVPB 750 mg/150 ml premix     750 mg 150 mL/hr over 60 Minutes Intravenous  Once 11/08/13 0911 11/08/13 1247   11/07/13 1400  vancomycin (VANCOCIN) 1,500 mg in sodium chloride 0.9 % 500 mL IVPB     1,500 mg 250 mL/hr over 120 Minutes Intravenous  Once 11/07/13 1244 11/07/13 1712   11/07/13 1400  piperacillin-tazobactam (ZOSYN) IVPB 2.25 g     2.25 g 100 mL/hr over 30 Minutes Intravenous 4 times per day 11/07/13 1244        Assessment: 89 YOM transferred from [facility] with respiratory failure on  ventilator. Orders to start broad spectrum antibiotics. His Scr is noted to be elevated at time of admission. CXR consistent with pulmonary edema vs pneumonia.  1/12 >>vancomycin >> 1/15 1/12 >>zosyn >> 1/15 1/15 >> Unasyn  Tmax: afeb  WBCs: no new labs today, 5.4 Renal: no new labs today, SCr = 2.8 for CrCl = 60ml/min (C-G) and 67ml/min (N), UOP = 0.7 ml/kg/min  1/12 blood: NGTD 1/12: trach aspirate: normal flora 1/12: influenza PCR: negative 1/15: C. Difficile: pending  Dose changes/drug level info:  1/13 0500 random vancomycin level = 19.23mcg/ml following vancomycin 1500mg  IV x 1 1/12 at 15:00 1/15 0500 random vancomycin level = 15.2 mcg/ml  Goal of Therapy:  Vancomycin trough level 15-20 mcg/ml  Plan:  Day #4 vancomycin/zosyn  Spoke with Dr. Vassie Loll, stopping vancomycin (dosing per levels). Patient having diarrhea, ? Antibiotic associated (C. Difficile ordered) - will change zosyn to unasyn 3gm IV q12h  Pharmacy will follow at distance and write notes if change in Unasyn dose needed  Thanks,  Juliette Alcide, PharmD, BCPS.   Pager: [phone]  11/10/2013,10:57 AM

## 2013-11-10 NOTE — Progress Notes (Signed)
PT Cancellation Note  Patient Details Name: [name] MRN: [id number] DOB: 10/05/1919   Cancelled Treatment:    Reason Eval/Treat Not Completed: Other (comment) (pt was just assisted back to bed by nursing. will see in AM .)   Rada Hay 11/10/2013, 3:58 PM Blanchard Kelch PT [phone]

## 2013-11-10 NOTE — Evaluation (Signed)
Clinical/Bedside Swallow Evaluation Patient Details  Name: [name] MRN: [id number] Date of Birth: 07-19-1919  Today's Date: 11/10/2013 Time: 2481-8590 SLP Time Calculation (min): 45 min  Past Medical History:  Past Medical History  Diagnosis Date  . Coronary artery disease   . Hypertension   . Pacemaker 05/08/2009    [employer]  . CKD (chronic kidney disease), stage III   . Complete heart block   . GERD (gastroesophageal reflux disease)   . Diastolic heart failure   . Myocardial infarction 2010  . Stroke 1998, 2005, 2009  . Shingles   . CHF (congestive heart failure)     diastolic  . COPD (chronic obstructive pulmonary disease)   . Arthritis   . Left-sided weakness   . Peripheral edema   . AAA (abdominal aortic aneurysm)     infrarenal 03/12/12- 3.7 x 3.5 cm   Past Surgical History:  Past Surgical History  Procedure Laterality Date  . Eye surgery      tear duct probing  . Permanent pacemaker insertion  05/08/2009    [employer]onic  . Hernia repair  10 yrs ago    [facility]  . Cataract extraction w/phaco  02/03/2012    Procedure: CATARACT EXTRACTION PHACO AND INTRAOCULAR LENS PLACEMENT (IOC);  Surgeon: Loraine Leriche T. Nile Engebretsen, MD;  Location: [facility];  Service: Ophthalmology;  Laterality: Left;  CDE=29.12  . Cataract extraction w/phaco  03/23/2012    Procedure: CATARACT EXTRACTION PHACO AND INTRAOCULAR LENS PLACEMENT (IOC);  Surgeon: Loraine Leriche T. Nile Tun, MD;  Location: [facility];  Service: Ophthalmology;  Laterality: Right;  CDE 22.98  . Nm myocar perf wall motion  06/13/2009    mild to mod ischemia basal inferior & mid inferior regions   HPI:  80 yo adm to [facility] with respiratory difficulties.  PMH + for COPD, CVA, dysphagia.  Pt with ? aspiration of water prior to admission- respiratory failure with pt required intubation from 11/07/13-11/09/13.  Swallow evaluation ordered as Md suspects possible aspiration overt CHF.  Pt denies dysphagia symptoms at home.     Assessment / Plan /  Recommendation Clinical Impression  Pt presents without focal CN deficits impacting swallow musculature, he had a surprisingly strong voice and was able to swallow with NG in place.  Minimal oral secretions removed with oral suctioning.   Pt's swallow was timely with clear voice throughout.  No overt clinical indication of aspiration - subtle throat clear x1 after water bolus.  Laryngeal elevation palpated at bedside appeared adequate.  SLP did note pt to belch and hold his chest, ? if consistent with esophageal dysphagia and/or if NG impacting clearance.  Pt denies increased work of breathing with po intake.   He does admit to occasional pill dysphagia - sensation of stasis in pharynx requiring more liquids to clear.  Given dysphagia hx, recommend to consider esophageal evaluation - eg: barium swallow to assess esoph function.  SLP educated pt to possible plan and he was agreeable.  Advised pt if MD allows him to eat to attempt to conduct two swallows with each bolus of liquid to faciliate esoph clearance.  Aspiration precautions reviewed with teach back.  SLP to follow up after testing to aid in management.  Thanks for this consult.     Aspiration Risk    Moderate - suspect secondary to possible esoph issues   Diet Recommendation NPO        Other  Recommendations Recommended Consults: Consider esophageal assessment   Follow Up Recommendations    TBD  Frequency and Duration min 2x/week  2 weeks   Pertinent Vitals/Pain Afebrile, decreased      Swallow Study Prior Functional Status   h/o dysphagia after CVA in 05, esophageal dysphagia diagnosed in 2006    General Date of Onset: 11/10/13 HPI: 80 [facility].  PMH + for COPD, CVA, dysphagia.  Pt with ? aspiration of water prior to admission- respiratory failure with pt required intubation from 11/07/13-11/09/13.  Swallow evaluation ordered as Md suspects possible aspiration overt CHF.  Pt denies dysphagia  symptoms at home.   Type of Study: Bedside swallow evaluation Previous Swallow Assessment: esophagram 2005, no reflux, mild dysmotlity requiring dry swallows to help clear, tiny Schatzki's ring and hiatal hernia Diet Prior to this Study: NPO;Other (Comment) (Ng tube) Temperature Spikes Noted: No Respiratory Status: Nasal cannula History of Recent Intubation: Yes Length of Intubations (days): 3 days Date extubated: 11/09/13 Behavior/Cognition: Alert;Cooperative Oral Cavity - Dentition: Adequate natural dentition Self-Feeding Abilities: Able to feed self Patient Positioning: Upright in bed Baseline Vocal Quality: Clear Volitional Cough: Strong Volitional Swallow: Able to elicit    Oral/Motor/Sensory Function Overall Oral Motor/Sensory Function: Appears within functional limits for tasks assessed (minimal viscous clear secretions in posterior oral cavity- orally suctioned)   Ice Chips Ice chips: Within functional limits Presentation: Self Fed;Spoon   Thin Liquid Thin Liquid: Within functional limits Presentation: Cup;Straw;Spoon Other Comments: very subtle throat clearing, belching    Nectar Thick Nectar Thick Liquid: Not tested   Honey Thick Honey Thick Liquid: Not tested   Puree Puree: Within functional limits Presentation: Spoon   Solid   GO    Solid: Within functional limits Presentation: [address]       Darius BailKimball, [name], [name] [facility] SLP [phone]

## 2013-11-10 NOTE — Progress Notes (Signed)
Name: [name] MRN: [id number] DOB: 03-18-1919    ADMISSION DATE:  11/07/2013 CONSULTATION DATE:  11/10/2013  REFERRING MD :  [facility] PRIMARY SERVICE: PCCM  CHIEF COMPLAINT:  resp distress  BRIEF PATIENT DESCRIPTION: 80 y.o. BIBEMS to [facility] in respiratory distress & hypoxic, poor mental status, required intubation, CXR shows R infiltrates PMH - diastolic heart failure, coronary artery disease s/p PPM, 3.7 cm (april 2012 ) abdominal aortic aneurysm, recent adm 09/2013 for extension of his old right CVA -aspirin changed to aggrenox (allergy to plavix) Echo and pacemaker check did not support a cardioembolic source . Pos trop > 20    SIGNIFICANT EVENTS / STUDIES:  1/12 cards consult >> no heparin 1/13: ECHO: EF 55-60%, gd I D dysfxn, AV restricted w/ mild stenosis, Mild MVR, LA dilated, mild-mod PAH , apical hypokinesis  LINES / TUBES: ETT 1/12 >>1/14  CULTURES: resp 1/12 >>oral flora bld 1/12 >>ng Rapid flu >>neg Urine strep >>neg  ANTIBIOTICS: 1/12 zosyn >> 1/12 vanc >>1/14  SUBJECTIVE:VITAL SIGNS: Temp:  [98.2 F (36.8 C)-99.5 F (37.5 C)] 98.6 F (37 C) (01/15 0700) Pulse Rate:  [68-102] 72 (01/15 0700) Resp:  [13-27] 18 (01/15 0700) BP: (124-202)/(64-93) 164/87 mmHg (01/15 0700) SpO2:  [93 %-99 %] 98 % (01/15 0700) Weight:  [82.6 kg (182 lb 1.6 oz)] 82.6 kg (182 lb 1.6 oz) (01/15 0400) HEMODYNAMICS:   VENTILATOR SETTINGS:   INTAKE / OUTPUT: Intake/Output     01/14 0701 - 01/15 0700 01/15 0701 - 01/16 0700   I.V. (mL/kg) 100 (1.2)    NG/GT 1100    IV Piggyback 200    Total Intake(mL/kg) 1400 (16.9)    Urine (mL/kg/hr) 1375 (0.7)    Total Output 1375     Net +25          Stool Occurrence 3 x      PHYSICAL EXAMINATION: Gen. elderly, well-nourished, in no distress ENT - no lesions, no post nasal drip Lungs: no use of accessory muscles, no dullness to percussion, clear without rales or rhonchi, no excess WOB  Cardiovascular: Rhythm paced on  monitor, IV/VI systolic murmur also + AS,  no peripheral edema Abdomen: soft and non-tender, no hepatosplenomegaly, BS normal. Musculoskeletal: No deformities, no cyanosis or clubbing Neuro: off sedation, weaker on left 4/5 Skin:  Warm, no lesions/ rash   LABS:  CBC  Recent Labs Lab 11/07/13 0444 11/07/13 0456 11/08/13 0340 11/09/13 0346  WBC 11.4*  --  5.4 7.1  HGB 11.5* 12.6* 9.4* 10.3*  HCT 33.4* 37.0* 28.0* 30.4*  PLT 247  --  202 235   Coag's  Recent Labs Lab 11/07/13 0444  APTT 24  INR 1.10   BMET  Recent Labs Lab 11/07/13 0444 11/07/13 0456 11/08/13 0340 11/09/13 0346  NA 137 137 138 141  K 5.0 4.8 4.4 4.3  CL 101 106 104 103  CO2 20  --  21 24  BUN 40* 38* 41* 48*  CREATININE 2.66* 2.70* 2.80* 2.88*  GLUCOSE 226* 228* 121* 153*   Electrolytes  Recent Labs Lab 11/07/13 0444 11/08/13 0340 11/09/13 0346  CALCIUM 9.0 8.2* 8.5  MG  --  2.5  --   PHOS  --  3.9  --    Sepsis Markers  Recent Labs Lab 11/07/13 0600 11/07/13 1216  LATICACIDVEN 1.4  --   PROCALCITON  --  1.36   ABG  Recent Labs Lab 11/07/13 0510 11/07/13 1228  PHART 7.213* 7.430  PCO2ART 48.1*  31.6*  PO2ART 276.0* 78.7*   Liver Enzymes No results found for this basename: AST, ALT, ALKPHOS, BILITOT, ALBUMIN,  in the last 168 hours Cardiac Enzymes  Recent Labs Lab 11/07/13 0444 11/07/13 1240 11/07/13 1808 11/09/13 0346  TROPONINI <0.30 >20.00* >20.00* 5.19*  PROBNP 8303.0*  --   --   --    Glucose  Recent Labs Lab 11/09/13 1126 11/09/13 1535 11/09/13 1949 11/09/13 2352 11/10/13 0359 11/10/13 0750  GLUCAP 139* 149* 130* 143* 129* 123*    Imaging Dg Chest Port 1 View  11/09/2013   CLINICAL DATA:  Pneumonia versus CHF.  ETT positioning.  EXAM: PORTABLE CHEST - 1 VIEW  COMPARISON:  Chest x-ray from yesterday.  FINDINGS: Endotracheal tube ends in the mid thoracic trachea. An orogastric tube continues into the stomach.  Stable cardiomegaly. Distorted  mediastinal contours at least partly due to rightward rotation. Hazy appearance of the lower chest suggest pleural fluid or atelectasis. The apical lungs argues against pulmonary edema.  IMPRESSION: 1. Endotracheal and orogastric tubes in good position. 2. Similar appearance of bibasilar opacities, pleural effusions or atelectasis at least. 3. Cardiomegaly and aortic tortuosity or enlargement. Evaluation of the mediastinum limited by rotation.   Electronically Signed   By: Tiburcio Pea M.D.   On: 11/09/2013 05:38     CXR: Rt >> Lt ASD -improved 1/14, prob element of effusion and atx   ASSESSMENT / PLAN:  PULMONARY A: Acute resp failure -resolved  Favor aspiration pneumonia in setting of recent CVA >CXR improved.  P:   Extubated, DNI  Pulm hygiene , secretions remain an issue NPO w/ plan to ck swallow eval    CARDIOVASCULAR A: Diastolic dysfucntion, mild AS-Echocardiography and pacemaker check 09/2013 did not support a cardioembolic source of CVA NSTEMi - not a candidate for invasive intervention, high risk heparin, CEs trending down  ECHO: EF intact, gd I d dsyfxn , apical hypokinesis P:  Ct cardizem 60 tid & lopressor 50 bid ASA Med rx per cards   RENAL A:  AKI on CKD -baseline cr 2.1 in 09/2013, scr cont to rise  P:   Hold lasix , consider restart 1/16 F/u chem   GASTROINTESTINAL A:  Dysphagia - related to CVAs, prior to adm P:   Await swallow eval , otherwise resume TFs  HEMATOLOGIC A:  No issues P:  Follow WBC  INFECTIOUS A:  Aspiration pna, recent hospitalisation P:   Empiric zosyn x 5ds total   ENDOCRINE A:  No isues    P:   SSI  NEUROLOGIC A:  Recent extension of old rt MCA-CVA- MRI not done due to PPM P:   Ct aggrenox    ALVA,RAKESH V.  TODAY'S SUMMARY: Favor aspn pneumonia rather than CHF as a cause of his symptoms, complicated by NSTEMI,  he indicated would not want reintubation -discussed with family present.  DNR issued Change to SDU &  transfer to tele & [facility] in 24h  I have personally obtained a history, examined the patient, evaluated laboratory and imaging results, formulated the assessment and plan and placed orders. CRITICAL CARE: The patient is critically ill with multiple organ systems failure and requires high complexity decision making for assessment and support, frequent evaluation and titration of therapies, application of advanced monitoring technologies and extensive interpretation of multiple databases. Critical Care Time devoted to patient care services described in this note is 31 minutes.  ALVA,RAKESH V. [phone]  11/10/2013, 8:31 AM

## 2013-11-10 NOTE — Progress Notes (Signed)
NUTRITION FOLLOW UP  Intervention:   - Change TF to Vital 1.5 via NGT at 42m/hr and increase to 667mhr after 4 hours which will provide 2160 calories, 97g protein, and 100830mree water which will meet 103% estimated calorie needs, 96% estimated protein needs. If IVF d/c, recommend 175m54mter flushes 6 times/day.  - Diet advancement per MD - Will continue to monitor   Nutrition Dx:   Inadequate oral intake related to inability to eat as evidenced by NPO - ongoing   Goal:   TF to meet >90% of estimated nutritional needs - not met but increasing towards goal   Monitor:   Weights, labs, TF tolerance/advancement, diet advancement  Assessment:   Pt with hx of cardiac, vascular and cerebral disease - presents with acute SOB in distress - according to EMS, the pt was found by his wife with severe distress and unresponsive - they found him to have sat's of 66%, they started bipap, gave a dose of albuterol during transport - he had remained obtunded but barely able to arouse to voice. 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 He has COPD and AAA.   1/12 - Pt intubated today r/t acute respiratory failure favoring aspiration pneumonia in setting of recent CVA. MD in room currently, talking with family. RD consulted for TF management. Initiated TF of Vital High Protein start at 20ml65mand increase by 10ml 35my 4 hours to goal of 45ml/h68m 1/13 - Pt alone in room. Pt getting Vital High Protein at 40ml/hr58mrently via NGT. Per RN, pt tolerating TF well with no residuals this morning. Propofol rate decreased since yesterday. Per MD notes, plan is to continue TF today.   1/15 - Reviewed events since last RD visit. Pt extubated yesterday. Pt continues to get TF via NGT which RN reports pt tolerating well with only 10ml res37mls this morning. SLP in room  evaluating pt.   Current TF: Vital High Protein via NGT at 55ml/hr w97m provides 1320 calories, 116g protein, and 1103ml free 42mr and meets 63% estimated calorie needs, 100% estimated protein needs    Height: Ht Readings from Last 1 Encounters:  11/07/13 6' (1.829 m)    Weight Status:   Wt Readings from Last 1 Encounters:  11/10/13 182 lb 1.6 oz (82.6 kg)  Admit wt:        186 lb (84.3 kg)  Re-estimated needs:  Kcal: 2100-2300 Protein: 101-120g Fluid: >2.1L/day  Skin: +2 RLE, LLE edema  Diet Order: NPO   Intake/Output Summary (Last 24 hours) at 11/10/13 1151 Last data filed at 11/10/13 0900  Gross per 24 hour  Intake   1495 ml  Output   1375 ml  Net    120 ml    Last BM: 1/15   Labs:   Recent Labs Lab 11/07/13 0444 11/07/13 0456 11/08/13 0340 11/09/13 0346  NA 137 137 138 141  K 5.0 4.8 4.4 4.3  CL 101 106 104 103  CO2 20  --  21 24  BUN 40* 38* 41* 48*  CREATININE 2.66* 2.70* 2.80* 2.88*  CALCIUM 9.0  --  8.2* 8.5  MG  --   --  2.5  --   PHOS  --   --  3.9  --   GLUCOSE 226* 228* 121* 153*    CBG (last  3)   Recent Labs  11/09/13 2352 11/10/13 0359 11/10/13 0750  GLUCAP 143* 129* 123*    Scheduled Meds: . ampicillin-sulbactam (UNASYN) IV  3 g Intravenous Q12H  . antiseptic oral rinse  15 mL Mouth Rinse q12n4p  . atorvastatin  20 mg Oral q1800  . chlorhexidine  15 mL Mouth Rinse BID  . dipyridamole-aspirin  1 capsule Oral BID  . doxazosin  1 mg Oral QHS  . heparin  5,000 Units Subcutaneous Q8H  . hydrALAZINE  25 mg Oral Q8H  . insulin aspart  0-9 Units Subcutaneous Q4H  . metoprolol  50 mg Oral BID  . pantoprazole sodium  40 mg Per Tube Q1200    Continuous Infusions: . feeding supplement (VITAL HIGH PROTEIN) 1,000 mL (11/09/13 0221)     Mikey College MS, RD, [place] Pager [phone] After Hours Pager

## 2013-11-11 LAB — GLUCOSE, CAPILLARY
GLUCOSE-CAPILLARY: 116 mg/dL — AB (ref 70–99)
GLUCOSE-CAPILLARY: 208 mg/dL — AB (ref 70–99)
Glucose-Capillary: 122 mg/dL — ABNORMAL HIGH (ref 70–99)
Glucose-Capillary: 157 mg/dL — ABNORMAL HIGH (ref 70–99)
Glucose-Capillary: 168 mg/dL — ABNORMAL HIGH (ref 70–99)

## 2013-11-11 LAB — BASIC METABOLIC PANEL
BUN: 60 mg/dL — AB (ref 6–23)
CALCIUM: 8.8 mg/dL (ref 8.4–10.5)
CHLORIDE: 99 meq/L (ref 96–112)
CO2: 20 meq/L (ref 19–32)
CREATININE: 2.78 mg/dL — AB (ref 0.50–1.35)
GFR calc Af Amer: 21 mL/min — ABNORMAL LOW (ref >90)
GFR calc non Af Amer: 18 mL/min — ABNORMAL LOW (ref >90)
GLUCOSE: 130 mg/dL — AB (ref 70–99)
Potassium: 4.9 mEq/L (ref 3.7–5.3)
Sodium: 139 mEq/L (ref 137–147)

## 2013-11-11 LAB — CBC
HEMATOCRIT: 34 % — AB (ref 39.0–52.0)
Hemoglobin: 10.9 g/dL — ABNORMAL LOW (ref 13.0–17.0)
MCH: 29.9 pg (ref 26.0–34.0)
MCHC: 32.1 g/dL (ref 30.0–36.0)
MCV: 93.2 fL (ref 78.0–100.0)
PLATELETS: 241 10*3/uL (ref 150–400)
RBC: 3.65 MIL/uL — ABNORMAL LOW (ref 4.22–5.81)
RDW: 14.1 % (ref 11.5–15.5)
WBC: 8.7 10*3/uL (ref 4.0–10.5)

## 2013-11-11 MED ORDER — DILTIAZEM HCL ER COATED BEADS 180 MG PO CP24
180.0000 mg | ORAL_CAPSULE | Freq: Every day | ORAL | Status: DC
  Administered 2013-11-11 – 2013-11-22 (×13): 180 mg via ORAL
  Filled 2013-11-11 (×13): qty 1

## 2013-11-11 NOTE — Progress Notes (Signed)
Speech Language Pathology Treatment: Dysphagia  Patient Details Name: [name] MRN: [id number] DOB: 04/17/1919 Today's Date: 11/11/2013 Time: 3968-8648 SLP Time Calculation (min): 24 min  Assessment / Plan / Recommendation Clinical Impression  Pt was observed drinking coffee via cup, juice via straw.  No overt s/s aspiration observed or reported.  Pt had eaten a few bites of toast prior to SLP arrival, and indicated it was not good.  Did not want eggs or grits, so solid foods were not observed at this time.  Evaluating SLP recommended consideration of esophageal evaluation given history, and this does not appear to have been addressed at this time.  Pt appears to be tolerating thin liquids, and is following precautions given min cues without overt difficulty.  Since solids were not observed, SLP will follow up after the weekend to allow time for esophageal evaluation to be considered.  RN aware.   HPI HPI: 80 yo adm to [facility] with respiratory difficulties.  PMH + for COPD, CVA, dysphagia.  Pt with ? aspiration of water prior to admission- respiratory failure with pt required intubation from 11/07/13-11/09/13.  Swallow evaluation ordered as Md suspects possible aspiration overt CHF.  Pt denies dysphagia symptoms at home.     Pertinent Vitals VSS  SLP Plan  Continue with current plan of care    Recommendations Diet recommendations: Regular Liquids provided via: Cup;Straw Medication Administration: Whole meds with puree (follow with sip of water) Supervision: Intermittent supervision to cue for compensatory strategies Compensations: Slow rate;Small sips/bites;Multiple dry swallows after each bite/sip Postural Changes and/or Swallow Maneuvers: Seated upright 90 degrees;Upright 30-60 min after meal              Oral Care Recommendations: Oral care Q4 per protocol Plan: Continue with current plan of care    GO    [name] B. Murvin Natal [facility], CCC-SLP [phone]  [name] 11/11/2013, 9:52 AM

## 2013-11-11 NOTE — Progress Notes (Signed)
Pt has only had of urine output since 7am. Bladder scan performed and it showed about in the bladder. Pt encouraged to void at this time and was able to void 100cc. MD made aware of findings and no new orders received at this time. Pt has been diuresed over the past few days and MD had already stopped his diuretics this am. MD will evaluate labs in the am. Will continue to monitor pt's output.   Arta Bruce [facility] 11/11/2013 6:57 PM

## 2013-11-11 NOTE — Progress Notes (Signed)
SUBJECTIVE:  No new events.  Diuresed well.  Extubated yesterday.  OBJECTIVE:   Vitals:   Filed Vitals:   11/11/13 0400 11/11/13 0500 11/11/13 0600 11/11/13 0620  BP: 108/75 98/66 105/74 105/74  Pulse: 69 34 76   Temp: 98.4 F (36.9 C)     TempSrc: Oral     Resp: 16 22 24    Height:      Weight: 184 lb 8.4 oz (83.7 kg)     SpO2: 96% 96% 96%    I&O's:    Intake/Output Summary (Last 24 hours) at 11/11/13 0840 Last data filed at 11/11/13 0600  Gross per 24 hour  Intake    585 ml  Output    500 ml  Net     85 ml   TELEMETRY: Reviewed telemetry pt in atrial fibrillation:     PHYSICAL EXAM General: Well developed, well nourished, intubated, sedated Head:  Normal cephalic and atramatic  Lungs:  Coarse breath sounds bilaterally Heart:   Irregularly irregular, S1 S2 Abdomen:  abdomen soft and non-tender  Extremities:  Bilateral ankle edema.   Neuro:  intubated, sedated Psych:   intubated, sedated   LABS: Basic Metabolic Panel:  Recent Labs  96/01/5400/14/15 0346 11/11/13 0355  NA 141 139  K 4.3 4.9  CL 103 99  CO2 24 20  GLUCOSE 153* 130*  BUN 48* 60*  CREATININE 2.88* 2.78*  CALCIUM 8.5 8.8   Liver Function Tests: No results found for this basename: AST, ALT, ALKPHOS, BILITOT, PROT, ALBUMIN,  in the last 72 hours No results found for this basename: LIPASE, AMYLASE,  in the last 72 hours CBC:  Recent Labs  11/09/13 0346 11/11/13 0355  WBC 7.1 8.7  HGB 10.3* 10.9*  HCT 30.4* 34.0*  MCV 90.5 93.2  PLT 235 241   Cardiac Enzymes:  Recent Labs  11/09/13 0346  TROPONINI 5.19*   BNP: No components found with this basename: POCBNP,  D-Dimer: No results found for this basename: DDIMER,  in the last 72 hours Hemoglobin A1C: No results found for this basename: HGBA1C,  in the last 72 hours Fasting Lipid Panel: No results found for this basename: CHOL, HDL, LDLCALC, TRIG, CHOLHDL, LDLDIRECT,  in the last 72 hours Thyroid Function Tests: No results found  for this basename: TSH, T4TOTAL, FREET3, T3FREE, THYROIDAB,  in the last 72 hours Anemia Panel: No results found for this basename: VITAMINB12, FOLATE, FERRITIN, TIBC, IRON, RETICCTPCT,  in the last 72 hours Coag Panel:   Lab Results  Component Value Date   INR 1.10 11/07/2013   INR 1.1 05/01/2009   INR 1.0 04/30/2009    RADIOLOGY: Dg Chest Port 1 View  11/08/2013   CLINICAL DATA:  Pneumonia.  EXAM: PORTABLE CHEST - 1 VIEW  COMPARISON:  11/07/2013.  FINDINGS: Endotracheal tube and NG tube in stable position. Cardiomegaly with normal pulmonary vascularity. Pacemaker noted in stable position. Pulmonary infiltrate on the right is improved slightly. Persistent infiltrate remains. Small pleural effusions and left base atelectasis appear to be present.  IMPRESSION: 1. Improving but persistent right lung infiltrate. Small bilateral pleural effusions and mild left base atelectasis.  2.  Stable line and tube positions.   Electronically Signed   By: Maisie Fushomas  Register   On: 11/08/2013 07:09   Dg Chest Port 1 View  11/07/2013   CLINICAL DATA:  Respiratory distress, intubation.  EXAM: PORTABLE CHEST - 1 VIEW  COMPARISON:  Chest radiograph October 05, 2013  FINDINGS: Cardiac silhouette remains moderately enlarged,  tortuous, mildly ectatic calcified aorta. Right greater than left lung patchy airspace opacities in a background of increasing interstitial prominence and new subsegmental atelectasis. No pleural effusions. No pneumothorax. Mild central pulmonary vasculature congestion.  Endotracheal tube tip projects 5.6 cm above the carina. Dual lead left cardiac pacemaker in situ. Multiple EKG lines overlie the patient and may obscure subtle underlying pathology. soft tissue planes and included osseous structures are nonsuspicious.  IMPRESSION: Stable cardiomegaly, interval increase atelectasis, interstitial and alveolar airspace opacities, which may reflect pulmonary edema or pneumonia.   Electronically Signed   By:  Awilda Metro   On: 11/07/2013 05:23   Dg Abd Portable 1v  11/07/2013   CLINICAL DATA:  80 year old male status post NG tube placement. Initial encounter.  EXAM: PORTABLE ABDOMEN - 1 VIEW  COMPARISON:  Upper GI series 329 2006.  FINDINGS: Portable AP supine view at 1428 hrs. Enteric tube in place, side hole projects at the level of the distal thoracic esophagus or gastroesophageal junction. The tip of the tube is at the level of the gastric cardia. Visualized bowel gas pattern is non obstructed. Cardiac pacemaker leads partially visible. Degenerative changes in the spine.  IMPRESSION: NG tube tip at the proximal stomach. Advance 8 cm for more optimal placement.   Electronically Signed   By: Augusto Gamble M.D.   On: 11/07/2013 14:43      ASSESSMENT: NSTEMI, CVA  PLAN:  Agree with prior cardiac recommendations.  He is not a candidate for invasive testing due to risks of contrast and anticoagulation.  Continue medical therapy.   Continue to diurese as needed .  Following commands. Able answer questions. He does not report any chest discomfort.   Continue beta blocker, statin, aspirin.  No family in the room.  Left message on wife's cell phone yesterday.  Atrial fibrillation: Borderline rate control.  Continue metoprolol. Blood pressure is borderline at times. Given stroke, there is a concern about anticoagulation.  Risks of anticoagulation may outweigh benefit at this point. Would defer to neurology regarding the risk of hemorrhagic transformation.  Echocardiogram result reviewed. Overall ejection fraction is well preserved. There is apical hypokinesis which could be related to his infarction vs. Pacemaker.  His heart rhythm and blood pressure have been stable.  Would not pursue invasive management.   Corky Crafts., MD  11/11/2013  8:40 AM

## 2013-11-11 NOTE — Progress Notes (Signed)
[facility] PROGRESS NOTE  [name]:[id number] DOB: June 27, 1919 DOA: 11/07/2013 PCP: Colette Ribas, MD  Assessment/Plan: 1. Acute Hypoxic resp failure -due to aspiration pneumonia and CHF/NSTEMI -s/p VDRF -extubated 1/14  2. NSTEMI/Pulm edema -improving -on med management only due to advanced age/CKD -continue aggrenox/BB/statin  3. Afib, HR in 90s -continue diltiazem and metoprolol -aggrenox started per Neuro last month after CVA -FU with Neuro, to determine need for anticogulation  4. DM -stable, SSI  5. H/o CVA -aggrenox, Fu with NEuro  DVT proph: hep SQ  Ambulate, Tx to tele Pt eval  Code Status: DNR Family Communication:none at bedside, will try to contact wife Disposition Plan: may likely need SNF   Consultants:  cards  HPI/Subjective: Feels well, no further diarrhea  Objective: Filed Vitals:   11/11/13 1055  BP: 116/73  Pulse: 92  Temp:   Resp:     Intake/Output Summary (Last 24 hours) at 11/11/13 1202 Last data filed at 11/11/13 1000  Gross per 24 hour  Intake    780 ml  Output    280 ml  Net    500 ml   Filed Weights   11/09/13 0422 11/10/13 0400 11/11/13 0400  Weight: 85.3 kg (188 lb 0.8 oz) 82.6 kg (182 lb 1.6 oz) 83.7 kg (184 lb 8.4 oz)    Exam:   General: AA oriented to self, place person  Cardiovascular: S1S2/RRR  Respiratory: diminished at bases  Abdomen: soft, Nt, BS present  Musculoskeletal: no edema c/c  Data Reviewed: Basic Metabolic Panel:  Recent Labs Lab 11/07/13 0444 11/07/13 0456 11/08/13 0340 11/09/13 0346 11/11/13 0355  NA 137 137 138 141 139  K 5.0 4.8 4.4 4.3 4.9  CL 101 106 104 103 99  CO2 20  --  21 24 20   GLUCOSE 226* 228* 121* 153* 130*  BUN 40* 38* 41* 48* 60*  CREATININE 2.66* 2.70* 2.80* 2.88* 2.78*  CALCIUM 9.0  --  8.2* 8.5 8.8  MG  --   --  2.5  --   --   PHOS  --   --  3.9  --   --    Liver Function Tests: No results found for this basename: AST, ALT,  ALKPHOS, BILITOT, PROT, ALBUMIN,  in the last 168 hours No results found for this basename: LIPASE, AMYLASE,  in the last 168 hours No results found for this basename: AMMONIA,  in the last 168 hours CBC:  Recent Labs Lab 11/07/13 0444 11/07/13 0456 11/08/13 0340 11/09/13 0346 11/11/13 0355  WBC 11.4*  --  5.4 7.1 8.7  NEUTROABS 7.1  --   --   --   --   HGB 11.5* 12.6* 9.4* 10.3* 10.9*  HCT 33.4* 37.0* 28.0* 30.4* 34.0*  MCV 95.2  --  90.3 90.5 93.2  PLT 247  --  202 235 241   Cardiac Enzymes:  Recent Labs Lab 11/07/13 0444 11/07/13 1240 11/07/13 1808 11/09/13 0346  TROPONINI <0.30 >20.00* >20.00* 5.19*   BNP (last 3 results)  Recent Labs  10/05/13 1344 11/07/13 0444  PROBNP 3785.0* 8303.0*   CBG:  Recent Labs Lab 11/10/13 1611 11/10/13 2027 11/11/13 11/11/13 0356 11/11/13 0804  GLUCAP 106* 146* 119* 116* 122*    Recent Results (from the past 240 hour(s))  MRSA PCR SCREENING     Status: None   Collection Time    11/07/13 12:38 PM      Result Value Range Status   MRSA by PCR NEGATIVE  NEGATIVE Final   Comment:            The GeneXpert MRSA Assay (FDA     approved for NASAL specimens     only), is one component of a     comprehensive MRSA colonization     surveillance program. It is not     intended to diagnose MRSA     infection nor to guide or     monitor treatment for     MRSA infections.  CULTURE, RESPIRATORY (NON-EXPECTORATED)     Status: None   Collection Time    11/07/13 12:39 PM      Result Value Range Status   Specimen Description TRACHEAL ASPIRATE   Final   Special Requests NONE   Final   Gram Stain     Final   Value: MODERATE WBC PRESENT,BOTH PMN AND MONONUCLEAR     RARE SQUAMOUS EPITHELIAL CELLS PRESENT     RARE GRAM POSITIVE COCCI     IN PAIRS     Performed at [facility]   Culture     Final   Value: Non-Pathogenic Oropharyngeal-type Flora Isolated.     Performed at [facility]   Report Status 11/10/2013 FINAL    Final  CULTURE, BLOOD (ROUTINE X 2)     Status: None   Collection Time    11/07/13  1:25 PM      Result Value Range Status   Specimen Description BLOOD LEFT HAND   Final   Special Requests BOTTLES DRAWN AEROBIC ONLY 3CC   Final   Culture  Setup Time     Final   Value: 11/07/2013 16:02     Performed at [facility]   Culture     Final   Value:        BLOOD CULTURE RECEIVED NO GROWTH TO DATE CULTURE WILL BE HELD FOR 5 DAYS BEFORE ISSUING A FINAL NEGATIVE REPORT     Performed at [facility]   Report Status PENDING   Incomplete  CULTURE, BLOOD (ROUTINE X 2)     Status: None   Collection Time    11/07/13  1:30 PM      Result Value Range Status   Specimen Description BLOOD LEFT HAND   Final   Special Requests BOTTLES DRAWN AEROBIC AND ANAEROBIC 5CC   Final   Culture  Setup Time     Final   Value: 11/07/2013 16:03     Performed at [facility]   Culture     Final   Value:        BLOOD CULTURE RECEIVED NO GROWTH TO DATE CULTURE WILL BE HELD FOR 5 DAYS BEFORE ISSUING A FINAL NEGATIVE REPORT     Performed at [facility]   Report Status PENDING   Incomplete  CLOSTRIDIUM DIFFICILE BY PCR     Status: None   Collection Time    11/10/13  9:35 AM      Result Value Range Status   C difficile by pcr NEGATIVE  NEGATIVE Final   Comment: [facility]     Studies: Dg Esophagus  11/10/2013   CLINICAL DATA:  History of dysphagia and dysmotility. Evaluate for aspiration. Recent extubation.  EXAM: ESOPHOGRAM/BARIUM SWALLOW  TECHNIQUE: Single contrast examination was performed using  thin barium.  COMPARISON:  None.  FLUOROSCOPY TIME:  59 seconds  FINDINGS: The patient has limited mobility. Esophagus was initially evaluated in the RPO position with the head  rotated towards the right, providing near lateral evaluation of the proximal esophagus. No laryngeal penetration or aspiration was observed.  Additional barium was administered. There is  presbyesophagus with a decreased primary stripping wave. There is no evidence of focal ulceration or stricture. A 13 mm barium tablet was administered, and this passed without significant delay into the stomach.  IMPRESSION: 1. No evidence of laryngeal penetration or tracheal aspiration. 2. Presbyesophagus. 3. No mucosal ulceration or stricture identified.   Electronically Signed   By: Roxy Horseman M.D.   On: 11/10/2013 13:03    Scheduled Meds: . ampicillin-sulbactam (UNASYN) IV  3 g Intravenous Q12H  . antiseptic oral rinse  15 mL Mouth Rinse q12n4p  . atorvastatin  20 mg Oral q1800  . chlorhexidine  15 mL Mouth Rinse BID  . diltiazem  180 mg Oral Daily  . dipyridamole-aspirin  1 capsule Oral BID  . doxazosin  1 mg Oral QHS  . heparin  5,000 Units Subcutaneous Q8H  . hydrALAZINE  25 mg Oral Q8H  . insulin aspart  0-9 Units Subcutaneous Q4H  . metoprolol  50 mg Oral BID  . pantoprazole sodium  40 mg Per Tube Q1200   Continuous Infusions: . feeding supplement (VITAL 1.5 CAL)      Active Problems:   CVA (cerebral infarction)   HTN (hypertension)   CKD (chronic kidney disease), stage III   CAD (coronary artery disease)   AAA (abdominal aortic aneurysm)   Pacemaker - Medtronic adapta dual-chamber implanted 2010   Acute respiratory distress   CHF (congestive heart failure)   Acute respiratory failure   Palliative care status   Time spent:   [facility]  [facility] Pager [phone]. If 7PM-7AM, please contact night-coverage at [email or web address], password [facility] 11/11/2013, 12:02 PM  LOS: 4 days

## 2013-11-11 NOTE — Evaluation (Signed)
Physical Therapy Evaluation Patient Details Name: [name] MRN: [id number] DOB: 1919-07-20 Today's Date: 11/11/2013 Time: 1941-7408 PT Time Calculation (min): 31 min  PT Assessment / Plan / Recommendation History of Present Illness  80 y.o. BIBEMS to [facility] in respiratory distress, hypoxic, reqired ventilator, extubated 1/14. Pt has recent extension of lacunar infarct.  Clinical Impression  Pt presents w/ decreased balance, requiring 2 person assist for mobility. Pt will benefit from PT to address problems listed. Pt will benefit from post acute rehab. Recommend OT consult.    PT Assessment  Patient needs continued PT services    Follow Up Recommendations  SNF;LTACH 24/7 assist.    Does the patient have the potential to tolerate intense rehabilitation      Barriers to Discharge Decreased caregiver support      Equipment Recommendations  None recommended by PT    Recommendations for Other Services OT consult   Frequency Min 3X/week    Precautions / Restrictions Precautions Precautions: Fall Precaution Comments: swallow precautions Restrictions Weight Bearing Restrictions: No   Pertinent Vitals/Pain HR 80's sats >90's on RA, BP 109/74      Mobility  Bed Mobility Overal bed mobility: +2 for physical assistance General bed mobility comments: pt performed 10% of mobilizing to EOB, used bed pad to slide pt around, pt did assist with Legs and pulled up w/ RUE into sitting. Transfers Overall transfer level: Needs assistance Equipment used: Rolling walker (2 wheeled) Transfers: Sit to/from [employer] Sit to Stand: +2 physical assistance;From elevated surface Stand pivot transfers: +2 physical assistance General transfer comment: pt does stand for  about 30 seconds at a time for bottom to be washed. Stood x 3 from bed. pt performed 40% stand pivot to recliner. Listing heavily to the L. Assistance to move  R leg and stabilize L knee for weight bearing.     Exercises     PT Diagnosis: Generalized weakness;Hemiplegia non-dominant side  PT Problem List: Decreased strength;Decreased activity tolerance;Decreased balance;Decreased mobility;Impaired tone PT Treatment Interventions: Functional mobility training;Therapeutic activities;Therapeutic exercise;Balance training;Patient/family education     PT Goals(Current goals can be found in the care plan section) Acute Rehab PT Goals Patient Stated Goal: agreed to get up. PT Goal Formulation: With patient Time For Goal Achievement: 11/25/13 Potential to Achieve Goals: Fair  Visit Information  Last PT Received On: 11/11/13 Assistance Needed: +2 History of Present Illness: 80 y.o. BIBEMS to [facility] in respiratory distress, hypoxic, reqired ventilator, extubated 1/14. Pt has recent extension of lacunar infarct.       Prior Functioning  Home Living Family/patient expects to be discharged to:: Unsure (probable need for SNF unless patient extensive 24/7 caregivers.) Living Arrangements: Spouse/significant other Prior Function Comments: uncertain of level of assiststance PTA Communication Communication: No difficulties    Cognition  Cognition Arousal/Alertness: Awake/alert Behavior During Therapy: WFL for tasks assessed/performed Overall Cognitive Status: No family/caregiver present to determine baseline cognitive functioning    Extremity/Trunk Assessment Upper Extremity Assessment Upper Extremity Assessment: LUE deficits/detail LUE Deficits / Details: noted low tone, unable to raise UE, unable to grip RW. Lower Extremity Assessment Lower Extremity Assessment: LLE deficits/detail LLE Deficits / Details: able to extend knee and bear min weight  inn standing, Leg tends to Internally rotate. Cervical / Trunk Assessment Cervical / Trunk Assessment: Kyphotic   Balance Balance Overall balance assessment: Needs assistance Sitting-balance support: Single extremity supported;Feet  supported Sitting balance-Leahy Scale: Poor Sitting balance - Comments: pt listing to L, LUE is non supportive, LUE tends  to extend at elbow with no support . Postural control: Left lateral lean Standing balance support: Single extremity supported Standing balance-Leahy Scale: Poor Standing balance comment: held onto RW on RUE, unable to use L.  End of Session PT - End of Session Equipment Utilized During Treatment: Gait belt Activity Tolerance: Patient limited by fatigue Patient left: in chair;with call bell/phone within reach Nurse Communication: Mobility status;Need for lift equipment  GP     Rada HayHill, Francie Keeling Elizabeth 11/11/2013, 11:05 AM Blanchard KelchKaren Kourtlyn Charlet PT [phone]

## 2013-11-12 LAB — GLUCOSE, CAPILLARY
GLUCOSE-CAPILLARY: 136 mg/dL — AB (ref 70–99)
Glucose-Capillary: 106 mg/dL — ABNORMAL HIGH (ref 70–99)
Glucose-Capillary: 105 mg/dL — ABNORMAL HIGH (ref 70–99)
Glucose-Capillary: 104 mg/dL — ABNORMAL HIGH (ref 70–99)
Glucose-Capillary: 153 mg/dL — ABNORMAL HIGH (ref 70–99)

## 2013-11-12 LAB — CBC
HCT: 28.4 % — ABNORMAL LOW (ref 39.0–52.0)
Hemoglobin: 9.3 g/dL — ABNORMAL LOW (ref 13.0–17.0)
MCH: 30.3 pg (ref 26.0–34.0)
MCHC: 32.7 g/dL (ref 30.0–36.0)
MCV: 92.5 fL (ref 78.0–100.0)
Platelets: 230 10*3/uL (ref 150–400)
RBC: 3.07 MIL/uL — AB (ref 4.22–5.81)
RDW: 14.3 % (ref 11.5–15.5)
WBC: 8.8 10*3/uL (ref 4.0–10.5)

## 2013-11-12 LAB — BASIC METABOLIC PANEL
BUN: 76 mg/dL — ABNORMAL HIGH (ref 6–23)
CALCIUM: 8.5 mg/dL (ref 8.4–10.5)
CHLORIDE: 99 meq/L (ref 96–112)
CO2: 22 meq/L (ref 19–32)
CREATININE: 3.57 mg/dL — AB (ref 0.50–1.35)
GFR calc Af Amer: 16 mL/min — ABNORMAL LOW (ref >90)
GFR calc non Af Amer: 14 mL/min — ABNORMAL LOW (ref >90)
Glucose, Bld: 114 mg/dL — ABNORMAL HIGH (ref 70–99)
Potassium: 4.3 mEq/L (ref 3.7–5.3)
SODIUM: 139 meq/L (ref 137–147)

## 2013-11-12 MED ORDER — PANTOPRAZOLE SODIUM 40 MG PO TBEC
40.0000 mg | DELAYED_RELEASE_TABLET | Freq: Every day | ORAL | Status: DC
  Administered 2013-11-12 – 2013-11-22 (×11): 40 mg via ORAL
  Filled 2013-11-12 (×11): qty 1

## 2013-11-12 MED ORDER — ALBUTEROL SULFATE (2.5 MG/3ML) 0.083% IN NEBU
2.5000 mg | INHALATION_SOLUTION | RESPIRATORY_TRACT | Status: DC | PRN
  Administered 2013-11-12 – 2013-11-18 (×6): 2.5 mg via RESPIRATORY_TRACT
  Filled 2013-11-12 (×6): qty 3

## 2013-11-12 MED ORDER — SODIUM CHLORIDE 0.9 % IV SOLN
INTRAVENOUS | Status: AC
  Administered 2013-11-12: 10:00:00 via INTRAVENOUS

## 2013-11-12 MED ORDER — INSULIN ASPART 100 UNIT/ML ~~LOC~~ SOLN
0.0000 [IU] | Freq: Three times a day (TID) | SUBCUTANEOUS | Status: DC
  Administered 2013-11-13: 2 [IU] via SUBCUTANEOUS
  Administered 2013-11-13 – 2013-11-18 (×11): 1 [IU] via SUBCUTANEOUS

## 2013-11-12 MED ORDER — INSULIN ASPART 100 UNIT/ML ~~LOC~~ SOLN: 0.0000 [IU] | Freq: Every day | SUBCUTANEOUS | Status: DC

## 2013-11-12 MED ORDER — SODIUM CHLORIDE 0.9 % IV SOLN
INTRAVENOUS | Status: DC
  Administered 2013-11-12 – 2013-11-14 (×3): via INTRAVENOUS

## 2013-11-12 NOTE — Progress Notes (Signed)
Clinical Social Work Department CLINICAL SOCIAL WORK PLACEMENT NOTE 11/12/2013  Patient:  [name], [name]  Account Number:  [id number] Admit date:  11/07/2013  Clinical Social Worker:  Doroteo Glassman  Date/time:  11/12/2013 12:23 PM  Clinical Social Work is seeking post-discharge placement for this patient at the following level of care:   SKILLED NURSING   (*CSW will update this form in Epic as items are completed)   declined  Patient/family provided with [facility] Department of Clinical Social Work's list of facilities offering this level of care within the geographic area requested by the patient (or if unable, by the patient's family).  11/12/2013  Patient/family informed of their freedom to choose among providers that offer the needed level of care, that participate in Medicare, Medicaid or managed care program needed by the patient, have an available bed and are willing to accept the patient.  11/12/2013  Patient/family informed of [facility]' ownership interest in [facility], as well as of the fact that they are under no obligation to receive care at this facility.  PASARR submitted to EDS on  PASARR number received from EDS on   FL2 transmitted to all facilities in geographic area requested by pt/family on  11/12/2013 FL2 transmitted to all facilities within larger geographic area on   Patient informed that his/her managed care company has contracts with or will negotiate with  certain facilities, including the following:     Patient/family informed of bed offers received:   Patient chooses bed at  Physician recommends and patient chooses bed at    Patient to be transferred to  on   Patient to be transferred to facility by   The following physician request were entered in Epic:   Additional Comments:  [name], Theresia Majors Clinical Social Work [phone]

## 2013-11-12 NOTE — Progress Notes (Signed)
Subjective:  He has no complaints of shortness of breath or chest pain today.  Objective:  Vital Signs in the last 24 hours: BP 123/74  Pulse 68  Temp(Src) 98.5 F (36.9 C) (Oral)  Resp 20  Ht 6' (1.829 m)  Wt 83.7 kg (184 lb 8.4 oz)  BMI 25.02 kg/m2  SpO2 94%  Physical Exam: Elderly male who is somewhat slow to answer questions but in no acute distress  Lungs:  Clear  Cardiac:  Irregular rhythm, normal S1 and S2, no S3, no S3 or S4, 1-2/6 murmur Abdomen:  Soft, nontender, no masses Extremities:  No edema present  Intake/Output from previous day: 01/16 0701 - 01/17 0700 In: 1110 [P.O.:720; I.V.:240; IV Piggyback:150] Out: 230 [Urine:230] Weight Filed Weights   11/09/13 0422 11/10/13 0400 11/11/13 0400  Weight: 85.3 kg (188 lb 0.8 oz) 82.6 kg (182 lb 1.6 oz) 83.7 kg (184 lb 8.4 oz)    Lab Results: Basic Metabolic Panel:  Recent Labs  25/63/89 0355 11/12/13 0617  NA 139 139  K 4.9 4.3  CL 99 99  CO2 20 22  GLUCOSE 130* 114*  BUN 60* 76*  CREATININE 2.78* 3.57*    CBC:  Recent Labs  11/11/13 0355 11/12/13 0617  WBC 8.7 8.8  HGB 10.9* 9.3*  HCT 34.0* 28.4*  MCV 93.2 92.5  PLT 241 230    BNP    Component Value Date/Time   PROBNP 8303.0* 11/07/2013 0444    Telemetry: Atrial fibrillation with controlled ventricular response with paced beats  Assessment/Plan:  1. Chronic atrial fibrillation 2. Recent stroke 3. Recent respiratory distress with intubation 4. Acute on chronic renal failure with worsening following diuresis 5. Anemia  Recommendations:  Diuretics currently being held. Follow renal function.      Darden Palmer  MD [facility]  11/12/2013, 9:30 AM

## 2013-11-12 NOTE — Progress Notes (Signed)
Clinical Social Work Department BRIEF PSYCHOSOCIAL ASSESSMENT 11/12/2013  Patient:  [name]     Account Number:  [id number]     Admit date:  11/07/2013  Clinical Social Worker:  Doroteo Glassman  Date/Time:  11/12/2013 12:11 PM  Referred by:  Physician  Date Referred:  11/12/2013 Referred for  SNF Placement   Other Referral:   Interview type:  Other - See comment Other interview type:   [name] via phone    PSYCHOSOCIAL DATA Living Status:  FAMILY Admitted from facility:   Level of care:   Primary support name:  Mrs. [name] Primary support relationship to patient:  SPOUSE Degree of support available:   strong    CURRENT CONCERNS Current Concerns  Post-Acute Placement   Other Concerns:    SOCIAL WORK ASSESSMENT / PLAN Spoke with [name] via phone re: d/c plans.    [name] stated that Pt seems to be doing somewhat better and she's hopeful that he will be able to d/c to [facility] next week.  She explained that Pt received rehab at [facility] several years ago and that Pt received excellent care.  Additionally, [facility] is 5 mins from her home and is extremely convenient for her.  Mrs. [name] stated that the commute to [facility] from her home this past week has been trying and she is hoping that she won't have to commute anymore, once Pt is ready for d/c.  She stressed that she wants him closer to home.    CSW provided emotional support to Mrs. [name] and thanked her for her time.   Assessment/plan status:  Psychosocial Support/Ongoing Assessment of Needs Other assessment/ plan:   Information/referral to community resources:   n/a--only wants [facility]    PATIENT'S/FAMILY'S RESPONSE TO PLAN OF CARE: Mrs. [name] understands that Pt needs rehab and she is happy for him to return to [facility], as Pt received good care there and it's close to her home.  Mrs. [name] stressed that [facility] is where she wants Pt to be.    Mrs. [name] thanked CSW for time and  assistance.   [name], LCSWA Clinical Social Work [phone]

## 2013-11-12 NOTE — Progress Notes (Signed)
[facility] PROGRESS NOTE  NEALY KARAPETIAN RUE:[id number] DOB: [birth year] DOA: 11/07/2013 PCP: Colette Ribas, MD  Assessment/Plan: 1. Acute Hypoxic resp failure -due to aspiration pneumonia and CHF/NSTEMI -s/p VDRF -extubated 1/14  2. NSTEMI/Pulm edema -improving -on med management only due to advanced age/CKD -continue aggrenox/BB/statin -appreciate Cards input  3. Afib, HR improved -continue diltiazem and metoprolol -aggrenox started per Neuro last month after CVA -FU with Neuro, to determine need for anticogulation  4. DM -stable, SSI  5. H/o CVA -aggrenox, Fu with NEuro  6. ARF on CKD 4 -creatinine worsening with poor urine output -likely from overdiuresis -diuretics stopped yesterday, hydrate today and monitor Urine output/resp status closely  DVT proph: hep SQ  Ambulate, OOB to chair Pt eval pending  Code Status: DNR Family Communication:none at bedside, will try to contact wife, left a msg on her cell phone and tried home phone Disposition Plan: will likely need SNF   Consultants:  cards  HPI/Subjective: Feels well, no further diarrhea, poor urine output, Po intake poor   Objective: Filed Vitals:   11/12/13 0500  BP: 123/74  Pulse: 68  Temp: 98.5 F (36.9 C)  Resp: 20    Intake/Output Summary (Last 24 hours) at 11/12/13 0858 Last data filed at 11/12/13 0729  Gross per 24 hour  Intake   1100 ml  Output    355 ml  Net    745 ml   Filed Weights   11/09/13 0422 11/10/13 0400 11/11/13 0400  Weight: 85.3 kg (188 lb 0.8 oz) 82.6 kg (182 lb 1.6 oz) 83.7 kg (184 lb 8.4 oz)    Exam:   General: AA oriented to self, place person  Cardiovascular: S1S2/RRR  Respiratory: diminished at bases, but clear otherwise  Abdomen: soft, Nt, BS present  Musculoskeletal: no edema c/c  Data Reviewed: Basic Metabolic Panel:  Recent Labs Lab 11/07/13 0444 11/07/13 0456 11/08/13 0340 11/09/13 0346 11/11/13 0355 11/12/13 0617  NA 137  137 138 141 139 139  K 5.0 4.8 4.4 4.3 4.9 4.3  CL 101 106 104 103 99 99  CO2 20  --  21 24 20 22   GLUCOSE 226* 228* 121* 153* 130* 114*  BUN 40* 38* 41* 48* 60* 76*  CREATININE 2.66* 2.70* 2.80* 2.88* 2.78* 3.57*  CALCIUM 9.0  --  8.2* 8.5 8.8 8.5  MG  --   --  2.5  --   --   --   PHOS  --   --  3.9  --   --   --    Liver Function Tests: No results found for this basename: AST, ALT, ALKPHOS, BILITOT, PROT, ALBUMIN,  in the last 168 hours No results found for this basename: LIPASE, AMYLASE,  in the last 168 hours No results found for this basename: AMMONIA,  in the last 168 hours CBC:  Recent Labs Lab 11/07/13 0444 11/07/13 0456 11/08/13 0340 11/09/13 0346 11/11/13 0355 11/12/13 0617  WBC 11.4*  --  5.4 7.1 8.7 8.8  NEUTROABS 7.1  --   --   --   --   --   HGB 11.5* 12.6* 9.4* 10.3* 10.9* 9.3*  HCT 33.4* 37.0* 28.0* 30.4* 34.0* 28.4*  MCV 95.2  --  90.3 90.5 93.2 92.5  PLT 247  --  202 235 241 230   Cardiac Enzymes:  Recent Labs Lab 11/07/13 0444 11/07/13 1240 11/07/13 1808 11/09/13 0346  TROPONINI <0.30 >20.00* >20.00* 5.19*   BNP (last 3 results)  Recent Labs  10/05/13 1344 11/07/13 0444  PROBNP 3785.0* 8303.0*   CBG:  Recent Labs Lab 11/11/13 1649 11/11/13 2030 11/12/13 0004 11/12/13 0424 11/12/13 0731  GLUCAP 168* 208* 105* 106* 104*    Recent Results (from the past 240 hour(s))  MRSA PCR SCREENING     Status: None   Collection Time    11/07/13 12:38 PM      Result Value Range Status   MRSA by PCR NEGATIVE  NEGATIVE Final   Comment:            The GeneXpert MRSA Assay (FDA     approved for NASAL specimens     only), is one component of a     comprehensive MRSA colonization     surveillance program. It is not     intended to diagnose MRSA     infection nor to guide or     monitor treatment for     MRSA infections.  CULTURE, RESPIRATORY (NON-EXPECTORATED)     Status: None   Collection Time    11/07/13 12:39 PM      Result Value Range  Status   Specimen Description TRACHEAL ASPIRATE   Final   Special Requests NONE   Final   Gram Stain     Final   Value: MODERATE WBC PRESENT,BOTH PMN AND MONONUCLEAR     RARE SQUAMOUS EPITHELIAL CELLS PRESENT     RARE GRAM POSITIVE COCCI     IN PAIRS     Performed at [facility]   Culture     Final   Value: Non-Pathogenic Oropharyngeal-type Flora Isolated.     Performed at [facility]   Report Status 11/10/2013 FINAL   Final  CULTURE, BLOOD (ROUTINE X 2)     Status: None   Collection Time    11/07/13  1:25 PM      Result Value Range Status   Specimen Description BLOOD LEFT HAND   Final   Special Requests BOTTLES DRAWN AEROBIC ONLY 3CC   Final   Culture  Setup Time     Final   Value: 11/07/2013 16:02     Performed at [facility]   Culture     Final   Value:        BLOOD CULTURE RECEIVED NO GROWTH TO DATE CULTURE WILL BE HELD FOR 5 DAYS BEFORE ISSUING A FINAL NEGATIVE REPORT     Performed at [facility]   Report Status PENDING   Incomplete  CULTURE, BLOOD (ROUTINE X 2)     Status: None   Collection Time    11/07/13  1:30 PM      Result Value Range Status   Specimen Description BLOOD LEFT HAND   Final   Special Requests BOTTLES DRAWN AEROBIC AND ANAEROBIC 5CC   Final   Culture  Setup Time     Final   Value: 11/07/2013 16:03     Performed at [facility]   Culture     Final   Value:        BLOOD CULTURE RECEIVED NO GROWTH TO DATE CULTURE WILL BE HELD FOR 5 DAYS BEFORE ISSUING A FINAL NEGATIVE REPORT     Performed at [facility]   Report Status PENDING   Incomplete  CLOSTRIDIUM DIFFICILE BY PCR     Status: None   Collection Time    11/10/13  9:35 AM      Result Value Range Status   C difficile by pcr NEGATIVE  NEGATIVE Final   Comment: [facility]     Studies: Dg Esophagus  11/10/2013   CLINICAL DATA:  History of dysphagia and dysmotility. Evaluate for aspiration. Recent extubation.  EXAM:  ESOPHOGRAM/BARIUM SWALLOW  TECHNIQUE: Single contrast examination was performed using  thin barium.  COMPARISON:  None.  FLUOROSCOPY TIME:  59 seconds  FINDINGS: The patient has limited mobility. Esophagus was initially evaluated in the RPO position with the head rotated towards the right, providing near lateral evaluation of the proximal esophagus. No laryngeal penetration or aspiration was observed.  Additional barium was administered. There is presbyesophagus with a decreased primary stripping wave. There is no evidence of focal ulceration or stricture. A 13 mm barium tablet was administered, and this passed without significant delay into the stomach.  IMPRESSION: 1. No evidence of laryngeal penetration or tracheal aspiration. 2. Presbyesophagus. 3. No mucosal ulceration or stricture identified.   Electronically Signed   By: Roxy HorsemanBill  Veazey M.D.   On: 11/10/2013 13:03    Scheduled Meds: . ampicillin-sulbactam (UNASYN) IV  3 g Intravenous Q12H  . antiseptic oral rinse  15 mL Mouth Rinse q12n4p  . atorvastatin  20 mg Oral q1800  . chlorhexidine  15 mL Mouth Rinse BID  . diltiazem  180 mg Oral Daily  . dipyridamole-aspirin  1 capsule Oral BID  . doxazosin  1 mg Oral QHS  . heparin  5,000 Units Subcutaneous Q8H  . hydrALAZINE  25 mg Oral Q8H  . insulin aspart  0-9 Units Subcutaneous Q4H  . metoprolol  50 mg Oral BID  . pantoprazole sodium  40 mg Per Tube Q1200   Continuous Infusions: . sodium chloride    . sodium chloride      Active Problems:   CVA (cerebral infarction)   HTN (hypertension)   CKD (chronic kidney disease), stage III   CAD (coronary artery disease)   AAA (abdominal aortic aneurysm)   Pacemaker - Medtronic adapta dual-chamber implanted 2010   Acute respiratory distress   CHF (congestive heart failure)   Acute respiratory failure   Palliative care status   Time spent: 25min   [facility]erd[facility] HospitalJOSEPH,Kache Mcclurg  [facility] Pager [phone]. If 7PM-7AM, please contact night-coverage  at [email or web address], password [email or web address] 11/12/2013, 8:58 AM  LOS: 5 days

## 2013-11-13 LAB — CULTURE, BLOOD (ROUTINE X 2)
Culture: NO GROWTH
Culture: NO GROWTH

## 2013-11-13 LAB — CBC
HCT: 27.3 % — ABNORMAL LOW (ref 39.0–52.0)
HEMOGLOBIN: 8.6 g/dL — AB (ref 13.0–17.0)
MCH: 29.7 pg (ref 26.0–34.0)
MCHC: 31.5 g/dL (ref 30.0–36.0)
MCV: 94.1 fL (ref 78.0–100.0)
Platelets: 230 10*3/uL (ref 150–400)
RBC: 2.9 MIL/uL — ABNORMAL LOW (ref 4.22–5.81)
RDW: 14.3 % (ref 11.5–15.5)
WBC: 8 10*3/uL (ref 4.0–10.5)

## 2013-11-13 LAB — BASIC METABOLIC PANEL WITH GFR
BUN: 84 mg/dL — ABNORMAL HIGH (ref 6–23)
CO2: 22 meq/L (ref 19–32)
Calcium: 8.5 mg/dL (ref 8.4–10.5)
Chloride: 98 meq/L (ref 96–112)
Creatinine, Ser: 3.96 mg/dL — ABNORMAL HIGH (ref 0.50–1.35)
GFR calc Af Amer: 14 mL/min — ABNORMAL LOW
GFR calc non Af Amer: 12 mL/min — ABNORMAL LOW
Glucose, Bld: 132 mg/dL — ABNORMAL HIGH (ref 70–99)
Potassium: 4.4 meq/L (ref 3.7–5.3)
Sodium: 137 meq/L (ref 137–147)

## 2013-11-13 LAB — GLUCOSE, CAPILLARY
GLUCOSE-CAPILLARY: 130 mg/dL — AB (ref 70–99)
GLUCOSE-CAPILLARY: 123 mg/dL — AB (ref 70–99)
GLUCOSE-CAPILLARY: 157 mg/dL — AB (ref 70–99)
Glucose-Capillary: 126 mg/dL — ABNORMAL HIGH (ref 70–99)
Glucose-Capillary: 148 mg/dL — ABNORMAL HIGH (ref 70–99)

## 2013-11-13 MED ORDER — SODIUM CHLORIDE 0.9 % IV SOLN
3.0000 g | Freq: Two times a day (BID) | INTRAVENOUS | Status: AC
  Administered 2013-11-13: 3 g via INTRAVENOUS
  Filled 2013-11-13: qty 3

## 2013-11-13 NOTE — Progress Notes (Signed)
Subjective:  He has no complaints of shortness of breath or chest pain today. Sleepy.  Objective:  Vital Signs in the last 24 hours: BP 123/95  Pulse 73  Temp(Src) 98.6 F (37 C) (Oral)  Resp 18  Ht 6' (1.829 m)  Wt 86.4 kg (190 lb 7.6 oz)  BMI 25.83 kg/m2  SpO2 98%  Physical Exam: Elderly male who is somewhat slow to answer questions but in no acute distress  Lungs:  Clear  Cardiac:  Irregular rhythm, normal S1 and S2, no S3, no S3 or S4, 2/6 murmur Abdomen:  Soft, nontender, no masses Extremities:  1+ edema present  Intake/Output from previous day: 01/17 0701 - 01/18 0700 In: 1000 [I.V.:900; IV Piggyback:100] Out: 400 [Urine:400] Weight Filed Weights   11/10/13 0400 11/11/13 0400 11/13/13 0505  Weight: 82.6 kg (182 lb 1.6 oz) 83.7 kg (184 lb 8.4 oz) 86.4 kg (190 lb 7.6 oz)    Lab Results: Basic Metabolic Panel:  Recent Labs  62/86/38 0617 11/13/13 0505  NA 139 137  K 4.3 4.4  CL 99 98  CO2 22 22  GLUCOSE 114* 132*  BUN 76* 84*  CREATININE 3.57* 3.96*    CBC:  Recent Labs  11/12/13 0617 11/13/13 0505  WBC 8.8 8.0  HGB 9.3* 8.6*  HCT 28.4* 27.3*  MCV 92.5 94.1  PLT 230 230    BNP    Component Value Date/Time   PROBNP 8303.0* 11/07/2013 0444    Telemetry: Atrial fibrillation with controlled ventricular response with paced beats  Assessment/Plan:  1. Chronic atrial fibrillation 2. Recent stroke 3. Recent respiratory distress with intubation 4. Acute on chronic renal failure with worsening following diuresis 5. Anemia that continues to worsen  Recommendations:  Weight is up significantly and continues to worsen. Currently diuretics on hold, but may need to restart if evidence of CHF occurs.      Darden Palmer  MD [facility]  11/13/2013, 8:54 AM

## 2013-11-13 NOTE — Progress Notes (Addendum)
[facility] PROGRESS NOTE  [name] JGG:[id number] DOB: 26-Nov-1918 DOA: 11/07/2013 PCP: Colette Ribas, MD  TRANSFER from [facility] 1/16  Assessment/Plan: 1. Acute Hypoxic resp failure -due to aspiration pneumonia and CHF/NSTEMI -s/p VDRF -extubated 1/14 -improved, O2 weaned  2. NSTEMI/Pulm edema -improved -on med management only due to advanced age/CKD -continue aggrenox/BB/statin -appreciate Cards input -lasix currently on hold due to worsening kidney function, but weight trending up  3. Aspiration pneumonia -to complete 7 days of abx today -Dc after today's dose  4.  ARF on CKD 4 -creatinine worsening with poor urine output, urine output 400cc for 1/17 and some incontinent episodes -likely from overdiuresis, NSTEMI -diuretics stopped 1/16,  -hydrate as resp status tolerates -he is not really a dialysis candidate, if his kidneys dont improve -attempted to reach wife yesterday and today, will try again later  5. Afib, HR improved -continue diltiazem and metoprolol -aggrenox started per Neuro last month after CVA -FU with Neuro, to determine need for anticogulation  6. DM -stable, SSI  7. H/o CVA -aggrenox, Fu with NEuro  DVT proph: hep SQ  Ambulate, OOB to chair Pt eval pending  Code Status: DNR Family Communication:none at bedside, will try to contact wife, left a msg on her cell phone and tried home phone Disposition Plan: will likely need SNF   Consultants:  cards  HPI/Subjective: Sleepy this am, breathing ok, urine output poor but improved from yesterday   Objective: Filed Vitals:   11/13/13 0505  BP: 123/95  Pulse: 73  Temp: 98.6 F (37 C)  Resp: 18    Intake/Output Summary (Last 24 hours) at 11/13/13 0958 Last data filed at 11/13/13 0714  Gross per 24 hour  Intake   1000 ml  Output    425 ml  Net    575 ml   Filed Weights   11/10/13 0400 11/11/13 0400 11/13/13 0505  Weight: 82.6 kg (182 lb 1.6 oz) 83.7 kg (184 lb 8.4  oz) 86.4 kg (190 lb 7.6 oz)    Exam:   General: drowsy, arousible, oriented to self, place  Cardiovascular: S1S2/RRR  Respiratory: diminished at bases, but clear otherwise  Abdomen: soft, Nt, BS present  Musculoskeletal: no edema c/c  Data Reviewed: Basic Metabolic Panel:  Recent Labs Lab 11/08/13 0340 11/09/13 0346 11/11/13 0355 11/12/13 0617 11/13/13 0505  NA 138 141 139 139 137  K 4.4 4.3 4.9 4.3 4.4  CL 104 103 99 99 98  CO2 21 24 20 22 22   GLUCOSE 121* 153* 130* 114* 132*  BUN 41* 48* 60* 76* 84*  CREATININE 2.80* 2.88* 2.78* 3.57* 3.96*  CALCIUM 8.2* 8.5 8.8 8.5 8.5  MG 2.5  --   --   --   --   PHOS 3.9  --   --   --   --    Liver Function Tests: No results found for this basename: AST, ALT, ALKPHOS, BILITOT, PROT, ALBUMIN,  in the last 168 hours No results found for this basename: LIPASE, AMYLASE,  in the last 168 hours No results found for this basename: AMMONIA,  in the last 168 hours CBC:  Recent Labs Lab 11/07/13 0444  11/08/13 0340 11/09/13 0346 11/11/13 0355 11/12/13 0617 11/13/13 0505  WBC 11.4*  --  5.4 7.1 8.7 8.8 8.0  NEUTROABS 7.1  --   --   --   --   --   --   HGB 11.5*  < > 9.4* 10.3* 10.9* 9.3* 8.6*  HCT  33.4*  < > 28.0* 30.4* 34.0* 28.4* 27.3*  MCV 95.2  --  90.3 90.5 93.2 92.5 94.1  PLT 247  --  202 235 241 230 230  < > = values in this interval not displayed. Cardiac Enzymes:  Recent Labs Lab 11/07/13 0444 11/07/13 1240 11/07/13 1808 11/09/13 0346  TROPONINI <0.30 >20.00* >20.00* 5.19*   BNP (last 3 results)  Recent Labs  10/05/13 1344 11/07/13 0444  PROBNP 3785.0* 8303.0*   CBG:  Recent Labs Lab 11/12/13 0731 11/12/13 1138 11/12/13 1717 11/12/13 2211 11/13/13 0748  GLUCAP 104* 136* 153* 130* 123*    Recent Results (from the past 240 hour(s))  MRSA PCR SCREENING     Status: None   Collection Time    11/07/13 12:38 PM      Result Value Range Status   MRSA by PCR NEGATIVE  NEGATIVE Final   Comment:             The GeneXpert MRSA Assay (FDA     approved for NASAL specimens     only), is one component of a     comprehensive MRSA colonization     surveillance program. It is not     intended to diagnose MRSA     infection nor to guide or     monitor treatment for     MRSA infections.  CULTURE, RESPIRATORY (NON-EXPECTORATED)     Status: None   Collection Time    11/07/13 12:39 PM      Result Value Range Status   Specimen Description TRACHEAL ASPIRATE   Final   Special Requests NONE   Final   Gram Stain     Final   Value: MODERATE WBC PRESENT,BOTH PMN AND MONONUCLEAR     RARE SQUAMOUS EPITHELIAL CELLS PRESENT     RARE GRAM POSITIVE COCCI     IN PAIRS     [facility]   Culture     Final   Value: Non-Pathogenic Oropharyngeal-type Flora Isolated.     [facility]formed at [facility]   Report Status 11/10/2013 FINAL   Final  CULTURE, BLOOD (ROUTINE X 2)     Status: None   Collection Time    11/07/13  1:25 PM      Result Value Range Status   Specimen Description BLOOD LEFT HAND   Final   Special Requests BOTTLES DRAWN AEROBIC ONLY 3CC   Final   Culture  Setup Time     Final   Value: 11/07/2013 16:02     [facility]formed at [facility]   Culture     Final   Value: NO GROWTH 5 DAYS     [facility] at [facility]   Report Status 11/13/2013 FINAL   Final  CULTURE, BLOOD (ROUTINE X 2)     Status: None   Collection Time    11/07/13  1:30 PM      Result Value Range Status   Specimen Description BLOOD LEFT HAND   Final   Special Requests BOTTLES DRAWN AEROBIC AND ANAEROBIC 5CC   Final   Culture  Setup Time     Final   Value: 11/07/2013 16:03     [facility]   Culture     Final   Value: NO GROWTH 5 DAYS     [facility]formed at [facility]   Report Status 11/13/2013 FINAL   Final  CLOSTRIDIUM DIFFICILE BY PCR     Status: None  Collection Time    11/10/13  9:35 AM      Result Value Range Status   C difficile by pcr NEGATIVE   NEGATIVE Final   Comment: Performed at [facility]     Studies: No results found.  Scheduled Meds: . ampicillin-sulbactam (UNASYN) IV  3 g Intravenous Q12H  . antiseptic oral rinse  15 mL Mouth Rinse q12n4p  . atorvastatin  20 mg Oral q1800  . chlorhexidine  15 mL Mouth Rinse BID  . diltiazem  180 mg Oral Daily  . dipyridamole-aspirin  1 capsule Oral BID  . doxazosin  1 mg Oral QHS  . heparin  5,000 Units Subcutaneous Q8H  . hydrALAZINE  25 mg Oral Q8H  . insulin aspart  0-5 Units Subcutaneous QHS  . insulin aspart  0-9 Units Subcutaneous TID WC  . metoprolol  50 mg Oral BID  . pantoprazole  40 mg Oral Daily   Continuous Infusions: . sodium chloride 75 mL/hr at 11/12/13 2250    Active Problems:   CVA (cerebral infarction)   HTN (hypertension)   CKD (chronic kidney disease), stage III   CAD (coronary artery disease)   AAA (abdominal aortic aneurysm)   Pacemaker - Medtronic adapta dual-chamber implanted 2010   Acute respiratory distress   CHF (congestive heart failure)   Acute respiratory failure   Palliative care status   Time spent:   [facility]  [facility] Pager [phone]. If 7PM-7AM, please contact night-coverage at [email or web address], password [email or web address] 11/13/2013, 9:58 AM  LOS: 6 days

## 2013-11-14 ENCOUNTER — Inpatient Hospital Stay (HOSPITAL_COMMUNITY): Payer: Medicare Other

## 2013-11-14 LAB — BASIC METABOLIC PANEL
BUN: 84 mg/dL — ABNORMAL HIGH (ref 6–23)
CO2: 20 mEq/L (ref 19–32)
Calcium: 8.7 mg/dL (ref 8.4–10.5)
Chloride: 98 mEq/L (ref 96–112)
Creatinine, Ser: 4.08 mg/dL — ABNORMAL HIGH (ref 0.50–1.35)
GFR calc Af Amer: 13 mL/min — ABNORMAL LOW (ref >90)
GFR, EST NON AFRICAN AMERICAN: 12 mL/min — AB (ref >90)
Glucose, Bld: 128 mg/dL — ABNORMAL HIGH (ref 70–99)
Potassium: 4.6 mEq/L (ref 3.7–5.3)
SODIUM: 136 meq/L — AB (ref 137–147)

## 2013-11-14 LAB — CBC
HCT: 27.4 % — ABNORMAL LOW (ref 39.0–52.0)
Hemoglobin: 8.8 g/dL — ABNORMAL LOW (ref 13.0–17.0)
MCH: 29.9 pg (ref 26.0–34.0)
MCHC: 32.1 g/dL (ref 30.0–36.0)
MCV: 93.2 fL (ref 78.0–100.0)
PLATELETS: 237 10*3/uL (ref 150–400)
RBC: 2.94 MIL/uL — AB (ref 4.22–5.81)
RDW: 14.2 % (ref 11.5–15.5)
WBC: 8.3 10*3/uL (ref 4.0–10.5)

## 2013-11-14 LAB — FERRITIN: FERRITIN: 696 ng/mL — AB (ref 22–322)

## 2013-11-14 LAB — IRON AND TIBC
Iron: 14 ug/dL — ABNORMAL LOW (ref 42–135)
Saturation Ratios: 8 % — ABNORMAL LOW (ref 20–55)
TIBC: 171 ug/dL — ABNORMAL LOW (ref 215–435)
UIBC: 157 ug/dL (ref 125–400)

## 2013-11-14 LAB — GLUCOSE, CAPILLARY
GLUCOSE-CAPILLARY: 135 mg/dL — AB (ref 70–99)
Glucose-Capillary: 124 mg/dL — ABNORMAL HIGH (ref 70–99)
Glucose-Capillary: 144 mg/dL — ABNORMAL HIGH (ref 70–99)
Glucose-Capillary: 131 mg/dL — ABNORMAL HIGH (ref 70–99)

## 2013-11-14 LAB — CREATININE, URINE, RANDOM: Creatinine, Urine: 38.93 mg/dL

## 2013-11-14 LAB — SODIUM, URINE, RANDOM: SODIUM UR: 74 meq/L

## 2013-11-14 MED ORDER — FUROSEMIDE 10 MG/ML IJ SOLN
60.0000 mg | Freq: Once | INTRAMUSCULAR | Status: AC
  Administered 2013-11-14: 60 mg via INTRAVENOUS
  Filled 2013-11-14: qty 6

## 2013-11-14 MED ORDER — TORSEMIDE 20 MG PO TABS
20.0000 mg | ORAL_TABLET | Freq: Every day | ORAL | Status: DC
  Administered 2013-11-14 – 2013-11-22 (×9): 20 mg via ORAL
  Filled 2013-11-14 (×9): qty 1

## 2013-11-14 NOTE — Progress Notes (Signed)
Speech Language Pathology Treatment: Dysphagia  Patient Details Name: [name] MRN: [id number] DOB: [birth year] Today's Date: 11/14/2013 Time: 0626-9485 SLP Time Calculation (min): 18 min  Assessment / Plan / Recommendation Clinical Impression  Pt seen to assess tolerance of po diet, educate to findings of esophagram and to provide precautions to mitigate aspiration risk.  Note pt with renal issues currently - this may exacerbate pt's respiratory issues.  SLP observed pt consuming water - self feeding.  No s/s of aspiration and clear voice throughout.   SLP also helped pt to brush his teeth and place partial.  Informed pt of need to ask for assistance to brush his teeth  Due to importance for pulmonary health.  Tongue with coating noted - instructed pt to brush to try to clear.   Provided written mitigation strategies to pt and used teach back for understanding- he verbalized gratitude for information.   No family present to educate at this time, SLP to follow up x1 for family education.    Intake has been 40-50% per documentation.      HPI HPI: 80 yo adm to [facility] with respiratory difficulties.  PMH + for COPD, CVA, dysphagia.  Pt with ? aspiration of water prior to admission- respiratory failure with pt required intubation from 11/07/13-11/09/13.  Swallow evaluation completed last week with recommendation for esophagram.  Esophagram showed presbyesophagus and pt was started on regular/thin diet.  SLP to see pt to assess tolerance and for pt/family education.   No family present at this time.    Pertinent Vitals Afebrile, decreased  SLP Plan  Continue with current plan of care    Recommendations Diet recommendations: Regular;Thin liquid Liquids provided via: Cup;Straw Medication Administration: Crushed with puree Supervision: Full supervision/cueing for compensatory strategies Compensations: Slow rate;Small sips/bites;Multiple dry swallows after each bite/sip Postural Changes and/or  Swallow Maneuvers: Seated upright 90 degrees;Upright 30-60 min after meal              Oral Care Recommendations: Oral care Q4 per protocol Follow up Recommendations: Skilled Nursing facility Plan: Continue with current plan of care    GO     Donavan Burnet, MS [facility] SLP [phone]

## 2013-11-14 NOTE — Consult Note (Signed)
Reason for Consult:AKI/CKD Referring Physician: Zannie Cove, MD  [name] is an 80 y.o. male.  HPI: Pt is a 80yo WM with multiple medical problems most notable for CAD, complete HB s/p PPM, HTN, DM, A Fib, CHF, COPD, AAA, CVA (most recently admitted in 12/14 with extension of CVA), and CKD stage 4 (baseline Scr 2-2.8) who was admitted on 11/07/13 with acute resp distress and AMS requiring intubation, likely due to aspiration PNA.  He has since been extubated but has had a decline in his mental status and has had decompensation of his diastolic CHF requiring IV diuresis.  Since admission, his Scr has risen and we were asked to help evaluate and manage his AKI/CKD.  He has been oliguric over the last 4 days and was started back on diuretics due to increasing weight and drop in UOP.  Of note, the drop in UOP coincided with the removal of his foley catheter, prior to its removal his UOP was >1300.  He is not a suitable dialysis candidate and the primary svc has had a discussion with his wife who is aware and amenable to medical management.  His trend in Scr is seen below.   Of note, he has not received any nephrotoxic agents ie. IV contrast, NSAIDs/Cox-II I's, ACE/ARB but did receive IV Vanco on 1/12 and 11/08/13 (total of 2.25gm) and had torsemide today due to edema/increasing weight.  Trend in Creatinine: Creatinine, Ser  Date/Time Value Range Status  11/14/2013  4:49 AM 4.08* 0.50 - 1.35 mg/dL Final  8/91/6945  0:38 AM 3.96* 0.50 - 1.35 mg/dL Final  8/82/8003  4:91 AM 3.57* 0.50 - 1.35 mg/dL Final  7/91/5056  9:79 AM 2.78* 0.50 - 1.35 mg/dL Final  4/80/1655  3:74 AM 2.88* 0.50 - 1.35 mg/dL Final  06/22/785  7:54 AM 2.80* 0.50 - 1.35 mg/dL Final  4/92/0100  7:12 AM 2.70* 0.50 - 1.35 mg/dL Final  1/97/5883  2:54 AM 2.66* 0.50 - 1.35 mg/dL Final  98/26/4158  3:09 AM 2.22* 0.50 - 1.35 mg/dL Final  40/76/8088  1:10 AM 2.08* 0.50 - 1.35 mg/dL Final  31/59/4585 92:92 PM 2.22* 0.50 - 1.35 mg/dL  Final  4/46/2863  8:17 PM 2.56* 0.50 - 1.35 mg/dL Final  05/06/6578 03:83 AM 2.52* 0.50 - 1.35 mg/dL Final  3/38/3291  9:16 AM 1.76* 0.50 - 1.35 mg/dL Final  04/01/44  9:97 AM 1.79* 0.50 - 1.35 mg/dL Final  7/41/4239  5:32 AM 2.05* 0.50 - 1.35 mg/dL Final  0/23/3435  6:86 AM 2.34* 0.50 - 1.35 mg/dL Final  16/05/3728  0:21 AM 1.44* 0.50 - 1.35 mg/dL Final  09/01/5207  0:22 PM 1.69* 0.50 - 1.35 mg/dL Final  3/36/1224  4:97 AM 1.80* 0.4 - 1.5 mg/dL Final  03/25/510  0:21 AM 2.11* 0.4 - 1.5 mg/dL Final  11/12/3565  0:14 AM 2.46* 0.4 - 1.5 mg/dL Final  10/29/129  4:38 AM 2.83* 0.4 - 1.5 mg/dL Final  8/87/5797  2:82 AM 2.67* 0.4 - 1.5 mg/dL Final  0/60/1561  5:37 AM 2.87* 0.4 - 1.5 mg/dL Final  9/43/2761  4:70 AM 3.45* 0.4 - 1.5 mg/dL Final  06/28/9573  7:34 AM 3.62* 0.4 - 1.5 mg/dL Final  0/12/7094  4:38 AM 3.18* 0.4 - 1.5 mg/dL Final  01/02/1839  3:75 AM 3.00* 0.4 - 1.5 mg/dL Final  01/27/6066  7:03 AM 2.65* 0.4 - 1.5 mg/dL Final  4/0/3524  8:18 PM 2.29* 0.4 - 1.5 mg/dL Final  03/04/930  1:21 PM 2.32* 0.4 -  1.5 mg/dL Final  5/6/43321/06/2008  9:516:05 AM 1.77*  Final  11/04/2007  6:00 AM 1.69*  Final  11/03/2007 12:35 PM 1.74*  Final    PMH:   Past Medical History  Diagnosis Date  . Coronary artery disease   . Hypertension   . Pacemaker 05/08/2009    [employer]  . CKD (chronic kidney disease), stage III   . Complete heart block   . GERD (gastroesophageal reflux disease)   . Diastolic heart failure   . Myocardial infarction 2010  . Stroke 1998, 2005, 2009  . Shingles   . CHF (congestive heart failure)     diastolic  . COPD (chronic obstructive pulmonary disease)   . Arthritis   . Left-sided weakness   . Peripheral edema   . AAA (abdominal aortic aneurysm)     infrarenal 03/12/12- 3.7 x 3.5 cm    PSH:   Past Surgical History  Procedure Laterality Date  . Eye surgery      tear duct probing  . Permanent pacemaker insertion  05/08/2009    [employer]tronic  . Hernia repair  10 yrs ago     [facility]  . Cataract extraction w/phaco  02/03/2012    Procedure: CATARACT EXTRACTION PHACO AND INTRAOCULAR LENS PLACEMENT (IOC);  Surgeon: Loraine LericheMark T. Nile RiggsShapiro, MD;  Location: [facility];  Service: Ophthalmology;  Laterality: Left;  CDE=29.12  . Cataract extraction w/phaco  03/23/2012    Procedure: CATARACT EXTRACTION PHACO AND INTRAOCULAR LENS PLACEMENT (IOC);  Surgeon: Loraine LericheMark T. Nile RiggsShapiro, MD;  Location: [facility];  Service: Ophthalmology;  Laterality: Right;  CDE 22.98  . Nm myocar perf wall motion  06/13/2009    mild to mod ischemia basal inferior & mid inferior regions    Allergies:  Allergies  Allergen Reactions  . Lasix [Furosemide] Itching    Scalp broke out  . Bystolic [Nebivolol Hcl] Rash  . Plavix [Clopidogrel Bisulfate] Rash    Medications:   Prior to Admission medications   Medication Sig Start Date End Date Taking? Authorizing Provider  atorvastatin (LIPITOR) 20 MG tablet Take 1 tablet (20 mg total) by mouth daily at 6 PM. 10/07/13  Yes Catarina Hartshornavid Tat, MD  beta carotene w/minerals (OCUVITE) tablet Take 1 tablet by mouth daily.   Yes Historical Provider, MD  Cholecalciferol (VITAMIN D) 2000 UNITS tablet Take 2,000 Units by mouth daily.   Yes Historical Provider, MD  diltiazem (CARDIZEM CD) 180 MG 24 hr capsule Take 180 mg by mouth daily.   Yes Historical Provider, MD  dipyridamole-aspirin (AGGRENOX) 200-25 MG per 12 hr capsule Take 1 capsule by mouth 2 (two) times daily. 10/07/13  Yes Catarina Hartshornavid Tat, MD  doxazosin (CARDURA) 2 MG tablet Take 1 mg by mouth at bedtime.   Yes Historical Provider, MD  ferrous sulfate 325 (65 FE) MG tablet Take 325 mg by mouth daily.   Yes Historical Provider, MD  fluticasone (FLONASE) 50 MCG/ACT nasal spray Place 2 sprays into the nose daily as needed. For allergies 11/13/11  Yes Elliot Cousinenise Fisher, MD  hydroxypropyl methylcellulose (ISOPTO TEARS) 2.5 % ophthalmic solution Place 1 drop into both eyes 3 (three) times daily as needed for dry eyes.   Yes Historical  Provider, MD  metoprolol (LOPRESSOR) 50 MG tablet Take 50 mg by mouth 2 (two) times daily. 08/05/11  Yes Christiane Haorinna L Sullivan, MD  omeprazole (PRILOSEC) 40 MG capsule Take 40 mg by mouth daily as needed.    Yes Historical Provider, MD  Saw Palmetto 450 MG CAPS Take 1 capsule by  mouth daily.   Yes Historical Provider, MD  senna (SENOKOT) 8.6 MG TABS tablet Take 2 tablets by mouth daily as needed for mild constipation.   Yes Historical Provider, MD  torsemide (DEMADEX) 20 MG tablet Take 20 mg by mouth daily.   Yes Historical Provider, MD  vitamin B-12 (CYANOCOBALAMIN) 500 MCG tablet Take 500 mcg by mouth daily.   Yes Historical Provider, MD  ibuprofen (ADVIL,MOTRIN) 600 MG tablet Take 600 mg by mouth every 6 (six) hours as needed for moderate pain.    Historical Provider, MD  nitroGLYCERIN (NITROLINGUAL) 0.4 MG/SPRAY spray Place 1 spray under the tongue every 5 (five) minutes x 3 doses as needed for chest pain (use as directed).    Historical Provider, MD    Inpatient medications: . antiseptic oral rinse  15 mL Mouth Rinse q12n4p  . atorvastatin  20 mg Oral q1800  . chlorhexidine  15 mL Mouth Rinse BID  . diltiazem  180 mg Oral Daily  . dipyridamole-aspirin  1 capsule Oral BID  . doxazosin  1 mg Oral QHS  . heparin  5,000 Units Subcutaneous Q8H  . hydrALAZINE  25 mg Oral Q8H  . insulin aspart  0-5 Units Subcutaneous QHS  . insulin aspart  0-9 Units Subcutaneous TID WC  . metoprolol  50 mg Oral BID  . pantoprazole  40 mg Oral Daily  . torsemide  20 mg Oral Daily    Discontinued Meds:   Medications Discontinued During This Encounter  Medication Reason  . ondansetron (ZOFRAN) injection 4 mg Completed Course  . propofol (DIPRIVAN) 10 mg/ml infusion Duplicate  . nitroGLYCERIN 0.2 mg/mL in dextrose 5 % infusion Duplicate  . feeding supplement (VITAL AF 1.2 CAL) liquid 1,000 mL   . doxazosin (CARDURA) 1 MG tablet Dose change  . diltiazem (TIAZAC) 180 MG 24 hr capsule Duplicate  . potassium  chloride (K-DUR,KLOR-CON) 10 MEQ tablet Duplicate  . isosorbide mononitrate (IMDUR) 15 mg TB24 24 hr tablet Prescription never filled  . hydrALAZINE (APRESOLINE) 25 MG tablet Entry Error  . azelastine (OPTIVAR) 0.05 % ophthalmic solution Entry Error  . chlorhexidine (PERIDEX) 0.12 % solution Entry Error  . potassium chloride (K-DUR) 10 MEQ tablet Patient has not taken in last 30 days  . hydrALAZINE (APRESOLINE) tablet 25 mg   . vitamin B-12 (CYANOCOBALAMIN) 500 MCG tablet Duplicate  . torsemide (DEMADEX) 20 MG tablet Change in therapy  . feeding supplement (VITAL HIGH PROTEIN) liquid 1,000 mL   . furosemide (LASIX) injection 40 mg   . propofol (DIPRIVAN) 10 mg/ml infusion   . aspirin tablet 325 mg   . piperacillin-tazobactam (ZOSYN) IVPB 2.25 g   . feeding supplement (VITAL HIGH PROTEIN) liquid 1,000 mL   . feeding supplement (VITAL 1.5 CAL) liquid 1,000 mL   . 0.9 %  sodium chloride infusion   . pantoprazole sodium (PROTONIX) 40 mg/20 mL oral suspension 40 mg   . insulin aspart (novoLOG) injection 0-9 Units   . Ampicillin-Sulbactam (UNASYN) 3 g in sodium chloride 0.9 % 100 mL IVPB     Social History:  reports that he has never smoked. He has never used smokeless tobacco. He reports that he drinks alcohol. He reports that he does not use illicit drugs.  Family History:   Family History  Problem Relation Age of Onset  . Anesthesia problems Neg Hx   . Hypotension Neg Hx   . Malignant hyperthermia Neg Hx   . Pseudochol deficiency Neg Hx     A comprehensive  review of systems was negative except for: Constitutional: positive for fatigue Respiratory: positive for cough and productive of white sputum Weight change: 2.4 kg (5 lb 4.7 oz)  Intake/Output Summary (Last 24 hours) at 11/14/13 1205 Last data filed at 11/14/13 1116  Gross per 24 hour  Intake   1980 ml  Output    525 ml  Net   1455 ml   BP 116/83  Pulse 82  Temp(Src) 98.1 F (36.7 C) (Oral)  Resp 20  Ht 6' (1.829 m)   Wt 88.8 kg (195 lb 12.3 oz)  BMI 26.55 kg/m2  SpO2 98% Filed Vitals:   11/13/13 1526 11/13/13 2201 11/14/13 0512 11/14/13 1036  BP:  119/68 116/83   Pulse:  69 82   Temp:  97.8 F (36.6 C) 98.1 F (36.7 C)   TempSrc:  Oral Oral   Resp:  24 20   Height:      Weight:   88.8 kg (195 lb 12.3 oz)   SpO2: 96% 96% 97% 98%     General appearance: fatigued, pale and slowed mentation Head: Normocephalic, without obvious abnormality, atraumatic Neck: no adenopathy, no JVD, supple, symmetrical, trachea midline and thyroid not enlarged, symmetric, no tenderness/mass/nodules Resp: diminished breath sounds bilaterally Cardio: irregularly irregular rhythm and no rub GI: soft, non-tender; bowel sounds normal; no masses,  no organomegaly and did have an urge to urinate when palpating his suprapubic region, but due to his position a complete exam could not be performed as he was too weak to reposition himself Extremities: edema tr pedal edema  Labs: Basic Metabolic Panel:  Recent Labs Lab 11/08/13 0340 11/09/13 0346 11/11/13 0355 11/12/13 0617 11/13/13 0505 11/14/13 0449  NA 138 141 139 139 137 136*  K 4.4 4.3 4.9 4.3 4.4 4.6  CL 104 103 99 99 98 98  CO2 21 24 20 22 22 20   GLUCOSE 121* 153* 130* 114* 132* 128*  BUN 41* 48* 60* 76* 84* 84*  CREATININE 2.80* 2.88* 2.78* 3.57* 3.96* 4.08*  CALCIUM 8.2* 8.5 8.8 8.5 8.5 8.7  PHOS 3.9  --   --   --   --   --    Liver Function Tests: No results found for this basename: AST, ALT, ALKPHOS, BILITOT, PROT, ALBUMIN,  in the last 168 hours No results found for this basename: LIPASE, AMYLASE,  in the last 168 hours No results found for this basename: AMMONIA,  in the last 168 hours CBC:  Recent Labs Lab 11/11/13 0355 11/12/13 0617 11/13/13 0505 11/14/13 0449  WBC 8.7 8.8 8.0 8.3  HGB 10.9* 9.3* 8.6* 8.8*  HCT 34.0* 28.4* 27.3* 27.4*  MCV 93.2 92.5 94.1 93.2  PLT 241 230 230 237   PT/INR: @LABRCNTIP (inr:5) Cardiac Enzymes: ) Recent  Labs Lab 11/07/13 1240 11/07/13 1808 11/09/13 0346  TROPONINI >20.00* >20.00* 5.19*   CBG:  Recent Labs Lab 11/13/13 0748 11/13/13 1204 11/13/13 1713 11/13/13 2154 11/14/13 0806  GLUCAP 123* 126* 157* 148* 124*    Iron Studies: No results found for this basename: IRON, TIBC, TRANSFERRIN, FERRITIN,  in the last 168 hours  Xrays/Other Studies: No results found.   Assessment/Plan: 1.  AKI/CKD stage 4:  ?cardiorenal syndrome vs. ATN due to acute illness vs. BOO. 1. Will order bladder scan and replace foley cath if >200cc PVR as well as renal US 2. Agree with conservative care and he is not a candidate for HD.  He may require hospice eval if his renal function continues to deteriorate  3. Cont to follow UOP and daily Scr and renal dose meds   2. ABLA- follow h/h 3. CHF- s/p torsemide dose today.  Follow I's/O's, overall breathing better per his report 4. CAD s/p NSTEMI- Cards following 5. COPD 6. Aspiration PNA- completed abx 7. CVA- with left hemiparesis 8. A fib, rate controlled 9. DM- per primary svc 10. Dispo- pending progression but may require SNF vs. Hospice.   Tran Randle A 11/14/2013, 12:05 PM

## 2013-11-14 NOTE — Progress Notes (Signed)
CARE MANAGEMENT NOTE 11/14/2013  Patient:  [name], [name]   Account Number:  [id number]  Date Initiated:  11/07/2013  Documentation initiated by:  Stephanie Mcglone  Subjective/Objective Assessment:   acute resp. failure and intubation     Action/Plan:   tbd   Anticipated DC Date:  11/17/2013   Anticipated DC Plan:  SKILLED NURSING FACILITY  In-house referral  Clinical Social Worker      DC Planning Services  NA      [facility] Choice  NA   Choice offered to / List presented to:  NA   DME arranged  NA      DME agency  NA     HH arranged  NA      HH agency  NA   Status of service:  In process, will continue to follow Medicare Important Message given?  NA - LOS <3 / Initial given by admissions (If response is "NO", the following Medicare IM given date fields will be blank) Date Medicare IM given:   Date Additional Medicare IM given:    Discharge Disposition:    Per UR Regulation:  Reviewed for med. necessity/level of care/duration of stay  If discussed at Long Length of Stay Meetings, dates discussed:    Comments:  [id number]?Cedar Roseman,RN,BSN,CCM: pt with hx of recent cva, acute renal failure, now needing snf placement, wt gain due to fluid but unable to diuresis due to renal function.  [id number] Earlene Plater, RN, BSN, CCM [phone] Chart Reviewed for discharge and hospital needs.  NSTEMI on admit, troponin remains elevated, has hx of cva's and pacemaker. Patient was extubated from vent on [id number], tolerated well.  Patient is progressing. Discharge needs at time of review:  None present will follow for needs. Review of patient progress due on [id number].   Grace Haggart,RN,BSN,CCM

## 2013-11-14 NOTE — Progress Notes (Signed)
Subjective:  He has no complaints of shortness of breath or chest pain today.  Objective:  Vital Signs in the last 24 hours: BP 116/83  Pulse 82  Temp(Src) 98.1 F (36.7 C) (Oral)  Resp 20  Ht 6' (1.829 m)  Wt 195 lb 12.3 oz (88.8 kg)  BMI 26.55 kg/m2  SpO2 97%  Physical Exam: Elderly male in no acute distress HEENT: normal Lungs:  Mildly diminished BS bases Cardiac:  Irregular rhythm Abdomen:  Soft, nontender, no masses Extremities:  Trace to 1+ edema present  Intake/Output from previous day: 01/18 0701 - 01/19 0700 In: 480 [P.O.:480] Out: 675 [Urine:675] Weight Filed Weights   11/11/13 0400 11/13/13 0505 11/14/13 0512  Weight: 184 lb 8.4 oz (83.7 kg) 190 lb 7.6 oz (86.4 kg) 195 lb 12.3 oz (88.8 kg)    Lab Results: Basic Metabolic Panel:  Recent Labs  82/50/53 0505 11/14/13 0449  NA 137 136*  K 4.4 4.6  CL 98 98  CO2 22 20  GLUCOSE 132* 128*  BUN 84* 84*  CREATININE 3.96* 4.08*    CBC:  Recent Labs  11/13/13 0505 11/14/13 0449  WBC 8.0 8.3  HGB 8.6* 8.8*  HCT 27.3* 27.4*  MCV 94.1 93.2  PLT 230 237    BNP    Component Value Date/Time   PROBNP 8303.0* 11/07/2013 0444    Telemetry: Atrial fibrillation with controlled ventricular response with paced beats  Assessment/Plan:  1. Atrial fibrillation 2. Recent stroke 3. Recent respiratory distress with intubation 4. Acute on chronic renal failure with worsening following diuresis 5. Anemia that continues to worsen  Recommendations: Patient continues to have increasing weight. Diuretics have been on hold because of worsening renal function. However he appears to be volume overloaded. Resume Demadex 20 mg daily and follow. Would ask nephrology to see. Continue Cardizem and metoprolol for rate control of atrial fibrillation. Continue Aggrenox. Given prior CVA the patient needs long-term anticoagulation. Would ask neurology when initiation would be appropriate.   [name] MD  [facility] Cardiology  11/14/2013, 7:23 AM

## 2013-11-14 NOTE — Progress Notes (Addendum)
[facility] PROGRESS NOTE  [name] OHF:[id number] DOB: [birth year] DOA: 11/07/2013 PCP: Colette Ribas, MD  TRANSFER from PCCM 1/16  Assessment/Plan: 1. Acute Hypoxic resp failure -due to aspiration pneumonia and CHF/NSTEMI -s/p VDRF -extubated 1/14 -O2 weaned -volume overloaded this am, being diuretics being restarted after being held due to worsening creatinine  2. NSTEMI/Pulm edema/cardio-renal syndrome -volume overloaded, lasix IV now and demadex started per Cards -Renal consulted -on med management only for NSTEMI due to advanced age/CKD -EF 55-60% -continue aggrenox/BB/statin -appreciate Cards input -lasix currently on hold due to worsening kidney function, but weight trending up  3. Aspiration pneumonia -completed 7 days of abx  -Dc after today's dose  4.  ARF on CKD 4 -creatinine 4.1 from 3.9 yesterday, baseline around 2.8 -urine output fair -likely from overdiuresis, NSTEMI -he was being gently hydrated for last 2 days, without improvement in creatinine -todays appears volume overloaded, diurese with IV lasix now, demadex per cards -he is not a dialysis candidate, if his kidneys dont improve, i have called and explained this to wife  5. Afib, HR improved -continue diltiazem and metoprolol -aggrenox started per Neuro last month after CVA -FU with Neuro, to determine need for anticogulation  6. DM -stable, SSI  7. H/o CVA -aggrenox, Fu with NEuro  DVT proph: hep SQ  Ambulate, OOB to chair Pt eval pending  Code Status: DNR Family Communication: called and d/w wife today, multiple attempts yesterday  Disposition Plan: will likely need SNF   Consultants:  cards  HPI/Subjective: Sleepy this am, mild dyspnea, ate brakfast   Objective: Filed Vitals:   11/14/13 0512  BP: 116/83  Pulse: 82  Temp: 98.1 F (36.7 C)  Resp: 20    Intake/Output Summary (Last 24 hours) at 11/14/13 1036 Last data filed at 11/14/13 0700  Gross per 24  hour  Intake   1380 ml  Output    525 ml  Net    855 ml   Filed Weights   11/11/13 0400 11/13/13 0505 11/14/13 0512  Weight: 83.7 kg (184 lb 8.4 oz) 86.4 kg (190 lb 7.6 oz) 88.8 kg (195 lb 12.3 oz)    Exam:   General: drowsy, arousible, oriented to self, place  Cardiovascular: S1S2/RRR  Respiratory: diminished at bases, but clear otherwise  Abdomen: soft, Nt, BS present  Musculoskeletal: no edema c/c  Data Reviewed: Basic Metabolic Panel:  Recent Labs Lab 11/08/13 0340 11/09/13 0346 11/11/13 0355 11/12/13 0617 11/13/13 0505 11/14/13 0449  NA 138 141 139 139 137 136*  K 4.4 4.3 4.9 4.3 4.4 4.6  CL 104 103 99 99 98 98  CO2 21 24 20 22 22 20   GLUCOSE 121* 153* 130* 114* 132* 128*  BUN 41* 48* 60* 76* 84* 84*  CREATININE 2.80* 2.88* 2.78* 3.57* 3.96* 4.08*  CALCIUM 8.2* 8.5 8.8 8.5 8.5 8.7  MG 2.5  --   --   --   --   --   PHOS 3.9  --   --   --   --   --    Liver Function Tests: No results found for this basename: AST, ALT, ALKPHOS, BILITOT, PROT, ALBUMIN,  in the last 168 hours No results found for this basename: LIPASE, AMYLASE,  in the last 168 hours No results found for this basename: AMMONIA,  in the last 168 hours CBC:  Recent Labs Lab 11/09/13 0346 11/11/13 0355 11/12/13 0617 11/13/13 0505 11/14/13 0449  WBC 7.1 8.7 8.8 8.0 8.3  HGB  10.3* 10.9* 9.3* 8.6* 8.8*  HCT 30.4* 34.0* 28.4* 27.3* 27.4*  MCV 90.5 93.2 92.5 94.1 93.2  PLT 235 241 230 230 237   Cardiac Enzymes:  Recent Labs Lab 11/07/13 1240 11/07/13 1808 11/09/13 0346  TROPONINI >20.00* >20.00* 5.19*   BNP (last 3 results)  Recent Labs  10/05/13 1344 11/07/13 0444  PROBNP 3785.0* 8303.0*   CBG:  Recent Labs Lab 11/13/13 0748 11/13/13 1204 11/13/13 1713 11/13/13 2154 11/14/13 0806  GLUCAP 123* 126* 157* 148* 124*    Recent Results (from the past 240 hour(s))  MRSA PCR SCREENING     Status: None   Collection Time    11/07/13 12:38 PM      Result Value Range  Status   MRSA by PCR NEGATIVE  NEGATIVE Final   Comment:            The GeneXpert MRSA Assay (FDA     approved for NASAL specimens     only), is one component of a     comprehensive MRSA colonization     surveillance program. It is not     intended to diagnose MRSA     infection nor to guide or     monitor treatment for     MRSA infections.  CULTURE, RESPIRATORY (NON-EXPECTORATED)     Status: None   Collection Time    11/07/13 12:39 PM      Result Value Range Status   Specimen Description TRACHEAL ASPIRATE   Final   Special Requests NONE   Final   Gram Stain     Final   Value: MODERATE WBC PRESENT,BOTH PMN AND MONONUCLEAR     RARE SQUAMOUS EPITHELIAL CELLS PRESENT     RARE GRAM POSITIVE COCCI     IN PAIRS     Performed at [facility]   Culture     Final   Value: Non-Pathogenic Oropharyngeal-type Flora Isolated.     Performed at [facility]   Report Status 11/10/2013 FINAL   Final  CULTURE, BLOOD (ROUTINE X 2)     Status: None   Collection Time    11/07/13  1:25 PM      Result Value Range Status   Specimen Description BLOOD LEFT HAND   Final   Special Requests BOTTLES DRAWN AEROBIC ONLY 3CC   Final   Culture  Setup Time     Final   Value: 11/07/2013 16:02     Performed at [facility]   Culture     Final   Value: NO GROWTH 5 DAYS     Performed at [facility]   Report Status 11/13/2013 FINAL   Final  CULTURE, BLOOD (ROUTINE X 2)     Status: None   Collection Time    11/07/13  1:30 PM      Result Value Range Status   Specimen Description BLOOD LEFT HAND   Final   Special Requests BOTTLES DRAWN AEROBIC AND ANAEROBIC 5CC   Final   Culture  Setup Time     Final   Value: 11/07/2013 16:03     Performed at [facility]   Culture     Final   Value: NO GROWTH 5 DAYS     Performed at [facility]   Report Status 11/13/2013 FINAL   Final  CLOSTRIDIUM DIFFICILE BY PCR     Status: None   Collection Time    11/10/13  9:35 AM  Result Value Range Status   C difficile by pcr NEGATIVE  NEGATIVE Final   Comment: Performed at [facility]     Studies: No results found.  Scheduled Meds: . antiseptic oral rinse  15 mL Mouth Rinse q12n4p  . atorvastatin  20 mg Oral q1800  . chlorhexidine  15 mL Mouth Rinse BID  . diltiazem  180 mg Oral Daily  . dipyridamole-aspirin  1 capsule Oral BID  . doxazosin  1 mg Oral QHS  . furosemide  60 mg Intravenous Once  . heparin  5,000 Units Subcutaneous Q8H  . hydrALAZINE  25 mg Oral Q8H  . insulin aspart  0-5 Units Subcutaneous QHS  . insulin aspart  0-9 Units Subcutaneous TID WC  . metoprolol  50 mg Oral BID  . pantoprazole  40 mg Oral Daily  . torsemide  20 mg Oral Daily   Continuous Infusions: . sodium chloride 75 mL/hr at 11/14/13 1610    Active Problems:   CVA (cerebral infarction)   HTN (hypertension)   CKD (chronic kidney disease), stage III   CAD (coronary artery disease)   AAA (abdominal aortic aneurysm)   Pacemaker - Medtronic adapta dual-chamber implanted 2010   Acute respiratory distress   CHF (congestive heart failure)   Acute respiratory failure   Palliative care status   Time spent:   [facility]  [facility] Pager [phone]. If 7PM-7AM, please contact night-coverage at [email or web address], password [facility] 11/14/2013, 10:36 AM  LOS: 7 days

## 2013-11-14 NOTE — Progress Notes (Signed)
Bladder Scan 54cc, MD aware

## 2013-11-14 NOTE — Progress Notes (Signed)
Physical Therapy Treatment Patient Details Name: [name] MRN: [id number] DOB: 1919-03-13 Today's Date: 11/14/2013 Time: 5784-6962 PT Time Calculation (min): 24 min  PT Assessment / Plan / Recommendation  History of Present Illness 80 y.o. BIBEMS to [facility] in respiratory distress, hypoxic, reqired ventilator, extubated 1/14. Pt has recent extension of lacunar infarct.   PT Comments   Pt assisted to [facility] for BM and then transferred to recliner.   Pt fatigues quickly and requires frequent cues for technique.   Follow Up Recommendations  SNF;LTACH     Does the patient have the potential to tolerate intense rehabilitation     Barriers to Discharge        Equipment Recommendations  None recommended by PT    Recommendations for Other Services    Frequency Min 3X/week   Progress towards PT Goals Progress towards PT goals: Progressing toward goals  Plan Current plan remains appropriate    Precautions / Restrictions Precautions Precautions: Fall Precaution Comments: swallow precautions   Pertinent Vitals/Pain Pt with no specific pain complaints, reports SOB after transfers with SpO2 mid 90s (RN notified)    Mobility  Bed Mobility Overal bed mobility: Needs Assistance Bed Mobility: Supine to Sit Supine to sit: Max assist General bed mobility comments: pt able to move legs over EOB and pull himself upright with R UE however required increased assist to scoot to EOB Transfers Overall transfer level: Needs assistance Equipment used: Rolling walker (2 wheeled) Transfers: Sit to/from [employer] Sit to Stand: +2 physical assistance;From elevated surface;Mod assist Stand pivot transfers: Mod assist;+2 physical assistance General transfer comment: verbal cues for safe technique, step by step directions required, assist to place L UE on RW, pt assisted with stand pivot bed to [facility] then performed 180* turn to sit in recliner    Exercises General Exercises - Lower  Extremity Ankle Circles/Pumps: AROM;Both;15 reps   PT Diagnosis:    PT Problem List:   PT Treatment Interventions:     PT Goals (current goals can now be found in the care plan section)    Visit Information  Last PT Received On: 11/14/13 Assistance Needed: +2 History of Present Illness: 80 y.o. BIBEMS to [facility] in respiratory distress, hypoxic, reqired ventilator, extubated 1/14. Pt has recent extension of lacunar infarct.    Subjective Data      Cognition  Cognition Arousal/Alertness: Awake/alert Behavior During Therapy: WFL for tasks assessed/performed Overall Cognitive Status: No family/caregiver present to determine baseline cognitive functioning    Balance  Balance Overall balance assessment: Needs assistance Sitting-balance support: Single extremity supported;Feet supported Sitting balance-Leahy Scale: Poor  End of Session PT - End of Session Equipment Utilized During Treatment: Gait belt Activity Tolerance: Patient limited by fatigue Patient left: in chair;with call bell/phone within reach Nurse Communication:  (pt with SOB, in recliner)   GP     Otha Monical,KATHrine E 11/14/2013, 12:27 PM Zenovia Jarred, PT, DPT 11/14/2013 Pager: [phone]

## 2013-11-15 LAB — GLUCOSE, CAPILLARY
GLUCOSE-CAPILLARY: 117 mg/dL — AB (ref 70–99)
GLUCOSE-CAPILLARY: 130 mg/dL — AB (ref 70–99)
Glucose-Capillary: 139 mg/dL — ABNORMAL HIGH (ref 70–99)
Glucose-Capillary: 132 mg/dL — ABNORMAL HIGH (ref 70–99)

## 2013-11-15 LAB — BASIC METABOLIC PANEL
BUN: 92 mg/dL — AB (ref 6–23)
CO2: 20 mEq/L (ref 19–32)
Calcium: 8.8 mg/dL (ref 8.4–10.5)
Chloride: 99 mEq/L (ref 96–112)
Creatinine, Ser: 4.35 mg/dL — ABNORMAL HIGH (ref 0.50–1.35)
GFR calc Af Amer: 12 mL/min — ABNORMAL LOW (ref >90)
GFR, EST NON AFRICAN AMERICAN: 11 mL/min — AB (ref >90)
Glucose, Bld: 120 mg/dL — ABNORMAL HIGH (ref 70–99)
POTASSIUM: 4.3 meq/L (ref 3.7–5.3)
SODIUM: 136 meq/L — AB (ref 137–147)

## 2013-11-15 LAB — CBC
HCT: 26.6 % — ABNORMAL LOW (ref 39.0–52.0)
HEMOGLOBIN: 8.8 g/dL — AB (ref 13.0–17.0)
MCH: 30.3 pg (ref 26.0–34.0)
MCHC: 33.1 g/dL (ref 30.0–36.0)
MCV: 91.7 fL (ref 78.0–100.0)
Platelets: 286 10*3/uL (ref 150–400)
RBC: 2.9 MIL/uL — AB (ref 4.22–5.81)
RDW: 14.2 % (ref 11.5–15.5)
WBC: 9.2 10*3/uL (ref 4.0–10.5)

## 2013-11-15 MED ORDER — FUROSEMIDE 10 MG/ML IJ SOLN
60.0000 mg | Freq: Once | INTRAMUSCULAR | Status: AC
  Administered 2013-11-15: 60 mg via INTRAVENOUS
  Filled 2013-11-15: qty 6

## 2013-11-15 MED ORDER — VITAMINS A & D EX OINT
TOPICAL_OINTMENT | CUTANEOUS | Status: AC
  Administered 2013-11-15: 5
  Filled 2013-11-15: qty 5

## 2013-11-15 NOTE — Progress Notes (Signed)
Subjective:  Some dyspnea; no chest pain  Objective:  Vital Signs in the last 24 hours: BP 121/72  Pulse 67  Temp(Src) 98.5 F (36.9 C) (Axillary)  Resp 24  Ht 6' (1.829 m)  Wt 200 lb 6.4 oz (90.9 kg)  BMI 27.17 kg/m2  SpO2 94%  Physical Exam: Elderly male in no acute distress HEENT: normal Lungs:  Mildly diminished BS bases Cardiac:  Irregular rhythm Abdomen:  Soft, nontender, no masses Extremities:  Trace to 1+ edema present  Intake/Output from previous day: 01/19 0701 - 01/20 0700 In: 1200 [P.O.:1200] Out: 775 [Urine:775] Weight Filed Weights   11/13/13 0505 11/14/13 0512 11/15/13 0609  Weight: 190 lb 7.6 oz (86.4 kg) 195 lb 12.3 oz (88.8 kg) 200 lb 6.4 oz (90.9 kg)    Lab Results: Basic Metabolic Panel:  Recent Labs  17/40/81 0449 11/15/13 0509  NA 136* 136*  K 4.6 4.3  CL 98 99  CO2 20 20  GLUCOSE 128* 120*  BUN 84* 92*  CREATININE 4.08* 4.35*    CBC:  Recent Labs  11/14/13 0449 11/15/13 0509  WBC 8.3 9.2  HGB 8.8* 8.8*  HCT 27.4* 26.6*  MCV 93.2 91.7  PLT 237 286    BNP    Component Value Date/Time   PROBNP 8303.0* 11/07/2013 0444    Telemetry: Atrial fibrillation with controlled ventricular response with paced beats  Assessment/Plan:  1. Atrial fibrillation 2. Recent stroke 3. Recent respiratory distress with intubation 4. Acute on chronic renal failure with worsening following diuresis 5. Anemia that continues to worsen  Recommendations: Patient continues to have increasing weight. He remains volume overloaded. However, renal function continues to deteriorate. Will give demadex 20 mg today; nephrology following. Prognosis appears to be poor. Continue Cardizem and metoprolol for rate control of atrial fibrillation. Continue Aggrenox. Given prior CVA the patient needs long-term anticoagulation if continued aggressive therapy felt to be warranted. Hospice may be best option but I will leave to primary care.   [name] MD  [facility] Cardiology  11/15/2013, 9:48 AM

## 2013-11-15 NOTE — Progress Notes (Addendum)
[facility] PROGRESS NOTE  [name] BJS:[id number] DOB: June 22, 1919 DOA: 11/07/2013 PCP: Colette Ribas, MD  TRANSFER from PCCM 1/16 89/M with multiple med issues namely CAD, CHF, recent CVA, CKD 4, Afib, admitted on 1/12 to PCCM, with ACute respiratory failure, due to pneumonia/NSTEMI requiring intubation. He is being followed by Cardiology and managed medically for NSTEMI due to CKD 4. He was extubated and transferred to [facility]. Unfortunately through this hospitalization, his kidney function has being declining, renal following now, he is being diuresed, but not a dialysis candidate and may need Palliative involvement   Assessment/Plan: 1. Acute Hypoxic resp failure -due to aspiration pneumonia and CHF/NSTEMI -s/p VDRF -extubated 1/14 -O2 weaned -volume overloaded, weight up, on demadex, lasix x1 again  2. NSTEMI/Pulm edema/cardio-renal syndrome -volume overloaded, lasix IV today again, continue demadex per Cards -Renal following -on med management only for NSTEMI due to advanced age/CKD -EF 55-60% -continue aggrenox/BB/statin -appreciate Cards input  3. Aspiration pneumonia -completed 7 days of abx   4.  ARF on CKD 4 -suspect ATN/cardiorenal syndrome -creatinine 4.3 from 4.1 yesterday, baseline around 2.8 -urine output fair -he was being gently hydrated for 2 days, without improvement in creatinine -still volume overloaded, continue demadex, Iv lasix today again -he is not a dialysis candidate , i have called and explained this to wife yesterday, I d/w Renal today, will need palliative consult in next day or two if his kidneys dont improve -Appreciate Dr.Colodonato's consult  5. Afib, HR improved -continue diltiazem and metoprolol -aggrenox started per Neuro last month after CVA -FU with Neuro, to determine need for anticogulation  6. DM -stable, SSI  7. H/o CVA -aggrenox, Fu with NEuro  DVT proph: hep SQ  Ambulate, OOB to chair Pt eval  pending  Code Status: DNR Family Communication: called and d/w wife yesterday, unable to contact wife this am as she was away at Doctors appt  Disposition Plan: depending on clinical course, SNF vs hospice   Consultants:  cards  HPI/Subjective: Sleepy this am, mild dyspnea, was fed breakfast  Objective: Filed Vitals:   11/15/13 0609  BP: 121/72  Pulse: 67  Temp: 98.5 F (36.9 C)  Resp: 24    Intake/Output Summary (Last 24 hours) at 11/15/13 1105 Last data filed at 11/15/13 1000  Gross per 24 hour  Intake   1080 ml  Output    775 ml  Net    305 ml   Filed Weights   11/13/13 0505 11/14/13 0512 11/15/13 0609  Weight: 86.4 kg (190 lb 7.6 oz) 88.8 kg (195 lb 12.3 oz) 90.9 kg (200 lb 6.4 oz)    Exam:   General: drowsy, arousible, oriented to self, place  Cardiovascular: S1S2/RRR  Respiratory: diminished at bases, but clear otherwise  Abdomen: soft, Nt, BS present  Musculoskeletal: trace edema c/c  Data Reviewed: Basic Metabolic Panel:  Recent Labs Lab 11/11/13 0355 11/12/13 0617 11/13/13 0505 11/14/13 0449 11/15/13 0509  NA 139 139 137 136* 136*  K 4.9 4.3 4.4 4.6 4.3  CL 99 99 98 98 99  CO2 20 22 22 20 20   GLUCOSE 130* 114* 132* 128* 120*  BUN 60* 76* 84* 84* 92*  CREATININE 2.78* 3.57* 3.96* 4.08* 4.35*  CALCIUM 8.8 8.5 8.5 8.7 8.8   Liver Function Tests: No results found for this basename: AST, ALT, ALKPHOS, BILITOT, PROT, ALBUMIN,  in the last 168 hours No results found for this basename: LIPASE, AMYLASE,  in the last 168 hours No results found  for this basename: AMMONIA,  in the last 168 hours CBC:  Recent Labs Lab 11/11/13 0355 11/12/13 0617 11/13/13 0505 11/14/13 0449 11/15/13 0509  WBC 8.7 8.8 8.0 8.3 9.2  HGB 10.9* 9.3* 8.6* 8.8* 8.8*  HCT 34.0* 28.4* 27.3* 27.4* 26.6*  MCV 93.2 92.5 94.1 93.2 91.7  PLT 241 230 230 237 286   Cardiac Enzymes:  Recent Labs Lab 11/09/13 0346  TROPONINI 5.19*   BNP (last 3 results)  Recent  Labs  10/05/13 1344 11/07/13 0444  PROBNP 3785.0* 8303.0*   CBG:  Recent Labs Lab 11/13/13 2154 11/14/13 0806 11/14/13 1209 11/14/13 1658 11/14/13 2205  GLUCAP 148* 124* 135* 144* 131*    Recent Results (from the past 240 hour(s))  MRSA PCR SCREENING     Status: None   Collection Time    11/07/13 12:38 PM      Result Value Range Status   MRSA by PCR NEGATIVE  NEGATIVE Final   Comment:            The GeneXpert MRSA Assay (FDA     approved for NASAL specimens     only), is one component of a     comprehensive MRSA colonization     surveillance program. It is not     intended to diagnose MRSA     infection nor to guide or     monitor treatment for     MRSA infections.  CULTURE, RESPIRATORY (NON-EXPECTORATED)     Status: None   Collection Time    11/07/13 12:39 PM      Result Value Range Status   Specimen Description TRACHEAL ASPIRATE   Final   Special Requests NONE   Final   Gram Stain     Final   Value: MODERATE WBC PRESENT,BOTH PMN AND MONONUCLEAR     RARE SQUAMOUS EPITHELIAL CELLS PRESENT     RARE GRAM POSITIVE COCCI     IN PAIRS     Performed at [facility]   Culture     Final   Value: Non-Pathogenic Oropharyngeal-type Flora Isolated.     Performed at [facility]   Report Status 11/10/2013 FINAL   Final  CULTURE, BLOOD (ROUTINE X 2)     Status: None   Collection Time    11/07/13  1:25 PM      Result Value Range Status   Specimen Description BLOOD LEFT HAND   Final   Special Requests BOTTLES DRAWN AEROBIC ONLY 3CC   Final   Culture  Setup Time     Final   Value: 11/07/2013 16:02     Performed at [facility]   Culture     Final   Value: NO GROWTH 5 DAYS     Performed at [facility]   Report Status 11/13/2013 FINAL   Final  CULTURE, BLOOD (ROUTINE X 2)     Status: None   Collection Time    11/07/13  1:30 PM      Result Value Range Status   Specimen Description BLOOD LEFT HAND   Final   Special Requests BOTTLES DRAWN  AEROBIC AND ANAEROBIC 5CC   Final   Culture  Setup Time     Final   Value: 11/07/2013 16:03     Performed at [facility]   Culture     Final   Value: NO GROWTH 5 DAYS     Performed at [facility]   Report Status 11/13/2013 FINAL  Final  CLOSTRIDIUM DIFFICILE BY PCR     Status: None   Collection Time    11/10/13  9:35 AM      Result Value Range Status   C difficile by pcr NEGATIVE  NEGATIVE Final   Comment: Performed at [facility]      Studies: [place] [facility]  11/14/2013   CLINICAL DATA:  Acute on chronic renal disease.  EXAM: RENAL/URINARY TRACT ULTRASOUND COMPLETE  COMPARISON:  Renal ultrasound 05/04/2009.  FINDINGS: Right Kidney:  Length: 9.5 cm. No stone, mass or hydronephrosis is identified. Increased cortical echogenicity is noted.  Left Kidney:  Length: 10.8 cm. No stone, mass or hydronephrosis is identified. Increased cortical echogenicity is noted.  Bladder:  Incompletely distended but otherwise unremarkable.  IMPRESSION: Negative for hydronephrosis or other acute abnormality.  Increased cortical echogenicity of the kidneys compatible with medical renal disease.   Electronically Signed   By: Drusilla Kannerhomas  Dalessio M.D.   On: 11/14/2013 19:55    Scheduled Meds: . antiseptic oral rinse  15 mL Mouth Rinse q12n4p  . atorvastatin  20 mg Oral q1800  . chlorhexidine  15 mL Mouth Rinse BID  . diltiazem  180 mg Oral Daily  . dipyridamole-aspirin  1 capsule Oral BID  . doxazosin  1 mg Oral QHS  . heparin  5,000 Units Subcutaneous Q8H  . hydrALAZINE  25 mg Oral Q8H  . insulin aspart  0-5 Units Subcutaneous QHS  . insulin aspart  0-9 Units Subcutaneous TID WC  . metoprolol  50 mg Oral BID  . pantoprazole  40 mg Oral Daily  . torsemide  20 mg Oral Daily  . vitamin A & D       Continuous Infusions: . sodium chloride 75 mL/hr at 11/14/13 [id number]    Active Problems:   CVA (cerebral infarction)   HTN (hypertension)   CKD (chronic kidney disease), stage III   CAD  (coronary artery disease)   AAA (abdominal aortic aneurysm)   Pacemaker - Medtronic adapta dual-chamber implanted 2010   Acute respiratory distress   CHF (congestive heart failure)   Acute respiratory failure   Palliative care status   Time spent: 25min   [facility]SEPH,Waleed Dettman  [facility] Pager [phone]. If 7PM-7AM, please contact night-coverage at [email or web address], password [email or web address] 11/15/2013, 11:05 AM  LOS: 8 days

## 2013-11-15 NOTE — Care Management Note (Addendum)
    Page 1 of 2   11/22/2013     12:43:32 PM   CARE MANAGEMENT NOTE 11/22/2013  Patient:  [name], [name]   Account Number:  [id number]  Date Initiated:  11/07/2013  Documentation initiated by:  DAVIS,RHONDA  Subjective/Objective Assessment:   acute resp. failure and intubation     Action/Plan:   tbd   Anticipated DC Date:  11/22/2013   Anticipated DC Plan:  SKILLED NURSING FACILITY  In-house referral  Clinical Social Worker      DC Planning Services  CM consult      [facility] Choice  NA   Choice offered to / List presented to:  NA   DME arranged  NA      DME agency  NA     HH arranged  NA      HH agency  NA   Status of service:  Completed, signed off Medicare Important Message given?  NA - LOS <3 / Initial given by admissions (If response is "NO", the following Medicare IM given date fields will be blank) Date Medicare IM given:   Date Additional Medicare IM given:    Discharge Disposition:  SKILLED NURSING FACILITY  Per UR Regulation:  Reviewed for med. necessity/level of care/duration of stay  If discussed at Long Length of Stay Meetings, dates discussed:   11/15/2013  11/17/2013  11/22/2013    Comments:  11/22/13 Sharnae Winfree RN,BSN NCM [phone] D/C SNF.  11/21/13 Hai Grabe RN,BSN NCM [phone] AWAITING PALLIATIVE RECOMMENDATIONS.CURRENT D/C PLAN SNF.  11/18/13 Dequandre Cordova RN,BSN NCM [phone] AWAIT PALLIATIVE MEETING,& RECOMMENDATIONS.CURRENT D/C PLAN SNF-[facility].  11/17/13 Brodee Mauritz RN,BSN NCM [phone] FOR HOSPICE CONS.CURRENT D/C PLAN SNF-[facility] @ [facility].  11/15/13 Cathern Tahir RN,BSN NCM [phone] NOTED BUN/CREATININE STILL RISING.NEPHROLOGY FOLLOWING.SNF VS HOSPICE.RECOMMEND PALLIATIVE CONS.  [id number]?Rhonda DAvis,RN,BSN,CCM: pt with hx of recent cva, acute renal failure, now needing snf placement, wt gain due to fluid but unable to diuresis due to renal function.  [id number] Earlene Plater, RN, BSN, CCM [phone] Chart  Reviewed for discharge and hospital needs.  NSTEMI on admit, troponin remains elevated, has hx of cva's and pacemaker. Patient was extubated from vent on [id number], tolerated well.  Patient is progressing. Discharge needs at time of review:  None present will follow for needs. Review of patient progress due on [id number].   Rhonda Davis,RN,BSN,CCM

## 2013-11-15 NOTE — Progress Notes (Signed)
Patient ID: Darius PallWilliam E Castillo, male   DOB: [birth year], 80 y.o.   MRN: [id number] S:somnolent and tachypnic but denies any complaints O:BP 121/72  Pulse 67  Temp(Src) 98.5 F (36.9 C) (Axillary)  Resp 24  Ht 6' (1.829 m)  Wt 90.9 kg (200 lb 6.4 oz)  BMI 27.17 kg/m2  SpO2 94%  Intake/Output Summary (Last 24 hours) at 11/15/13 1119 Last data filed at 11/15/13 1000  Gross per 24 hour  Intake    960 ml  Output    775 ml  Net    185 ml   Intake/Output: I/O last 3 completed shifts: In: 2100 [P.O.:1200; I.V.:900] Out: 1100 [Urine:1100]  Intake/Output this shift:  Total I/O In: 360 [P.O.:360] Out: -  Weight change: 2.1 kg (4 lb 10.1 oz) Gen:WD elderly WM in mild distress, RR 24-30 CVS:no rub Resp:decreased BS with end exp wheezes/whistles WUJ:WJXBJAbd:obese, nT Ext:tr presacral edema   Recent Labs Lab 11/09/13 0346 11/11/13 0355 11/12/13 0617 11/13/13 0505 11/14/13 0449 11/15/13 0509  NA 141 139 139 137 136* 136*  K 4.3 4.9 4.3 4.4 4.6 4.3  CL 103 99 99 98 98 99  CO2 24 20 22 22 20 20   GLUCOSE 153* 130* 114* 132* 128* 120*  BUN 48* 60* 76* 84* 84* 92*  CREATININE 2.88* 2.78* 3.57* 3.96* 4.08* 4.35*  CALCIUM 8.5 8.8 8.5 8.5 8.7 8.8   Liver Function Tests: No results found for this basename: AST, ALT, ALKPHOS, BILITOT, PROT, ALBUMIN,  in the last 168 hours No results found for this basename: LIPASE, AMYLASE,  in the last 168 hours No results found for this basename: AMMONIA,  in the last 168 hours CBC:  Recent Labs Lab 11/11/13 0355 11/12/13 0617 11/13/13 0505 11/14/13 0449 11/15/13 0509  WBC 8.7 8.8 8.0 8.3 9.2  HGB 10.9* 9.3* 8.6* 8.8* 8.8*  HCT 34.0* 28.4* 27.3* 27.4* 26.6*  MCV 93.2 92.5 94.1 93.2 91.7  PLT 241 230 230 237 286   Cardiac Enzymes:  Recent Labs Lab 11/09/13 0346  TROPONINI 5.19*   CBG:  Recent Labs Lab 11/13/13 2154 11/14/13 0806 11/14/13 1209 11/14/13 1658 11/14/13 2205  GLUCAP 148* 124* 135* 144* 131*    Iron Studies:  Recent  Labs  11/14/13 1405  IRON 14*  TIBC 171*  FERRITIN 696*   Studies/Results: [facility]  11/14/2013   CLINICAL DATA:  Acute on chronic renal disease.  EXAM: RENAL/URINARY TRACT ULTRASOUND COMPLETE  COMPARISON:  Renal ultrasound 05/04/2009.  FINDINGS: Right Kidney:  Length: 9.5 cm. No stone, mass or hydronephrosis is identified. Increased cortical echogenicity is noted.  Left Kidney:  Length: 10.8 cm. No stone, mass or hydronephrosis is identified. Increased cortical echogenicity is noted.  Bladder:  Incompletely distended but otherwise unremarkable.  IMPRESSION: Negative for hydronephrosis or other acute abnormality.  Increased cortical echogenicity of the kidneys compatible with medical renal disease.   Electronically Signed   By: Drusilla Kannerhomas  Dalessio M.D.   On: 11/14/2013 19:55   . antiseptic oral rinse  15 mL Mouth Rinse q12n4p  . atorvastatin  20 mg Oral q1800  . chlorhexidine  15 mL Mouth Rinse BID  . diltiazem  180 mg Oral Daily  . dipyridamole-aspirin  1 capsule Oral BID  . doxazosin  1 mg Oral QHS  . furosemide  60 mg Intravenous Once  . heparin  5,000 Units Subcutaneous Q8H  . hydrALAZINE  25 mg Oral Q8H  . insulin aspart  0-5 Units Subcutaneous QHS  . insulin aspart  0-9 Units Subcutaneous TID WC  . metoprolol  50 mg Oral BID  . pantoprazole  40 mg Oral Daily  . torsemide  20 mg Oral Daily    BMET    Component Value Date/Time   NA 136* 11/15/2013 0509   K 4.3 11/15/2013 0509   CL 99 11/15/2013 0509   CO2 20 11/15/2013 0509   GLUCOSE 120* 11/15/2013 0509   BUN 92* 11/15/2013 0509   CREATININE 4.35* 11/15/2013 0509   CALCIUM 8.8 11/15/2013 0509   GFRNONAA 11* 11/15/2013 0509   GFRAA 12* 11/15/2013 0509   CBC    Component Value Date/Time   WBC 9.2 11/15/2013 0509   RBC 2.90* 11/15/2013 0509   HGB 8.8* 11/15/2013 0509   HCT 26.6* 11/15/2013 0509   PLT 286 11/15/2013 0509   MCV 91.7 11/15/2013 0509   MCH 30.3 11/15/2013 0509   MCHC 33.1 11/15/2013 0509   RDW 14.2 11/15/2013 0509    LYMPHSABS 2.7 11/07/2013 0444   MONOABS 1.5* 11/07/2013 0444   EOSABS 0.1 11/07/2013 0444   BASOSABS 0.0 11/07/2013 0444     Assessment/Plan:  1. AKI/CKD stage 4: ?cardiorenal syndrome vs. ATN due to acute illness vs. BOO.  1. bladder scan revealed only 54cc.  Renal US without hydro. 2. Agree with conservative care and he is not a candidate for HD.  3. Agree with demadex for SOB but he may require hospice eval if his renal function continues to deteriorate 4. Cont to follow UOP and daily Scr and renal dose meds  2. ABLA- follow h/h 3. Cardiorenal syndrome/CHF- s/p torsemide dose yesterday and again today. Follow I's/O's, overall breathing better per his report but he remains somnolent. 4. CAD s/p NSTEMI- Cards following 5. Iron deficiency- may or may not benefit from IV feraheme 1020mg , will d/w primary team. 6. COPD 7. Aspiration PNA- completed abx 8. CVA- with left hemiparesis 9. A fib, rate controlled 10. DM- per primary svc 11. Dispo- pending progression but may require SNF vs. Hospice (especially if his Scr continues to climb) 12.   [name] A

## 2013-11-15 NOTE — Progress Notes (Signed)
CSW continues to follow for discharge planning - [facility] has offered a bed, though per MD notes - possible hospice.   May want to consider Palliative Care Consult. CSW will continue to follow.   Unice Bailey, LCSW [facility] Clinical Social Worker cell #: [phone]

## 2013-11-16 DIAGNOSIS — E785 Hyperlipidemia, unspecified: Secondary | ICD-10-CM

## 2013-11-16 LAB — BASIC METABOLIC PANEL
BUN: 94 mg/dL — AB (ref 6–23)
CO2: 21 mEq/L (ref 19–32)
CREATININE: 4.38 mg/dL — AB (ref 0.50–1.35)
Calcium: 8.7 mg/dL (ref 8.4–10.5)
Chloride: 98 mEq/L (ref 96–112)
GFR, EST AFRICAN AMERICAN: 12 mL/min — AB (ref >90)
GFR, EST NON AFRICAN AMERICAN: 11 mL/min — AB (ref >90)
GLUCOSE: 106 mg/dL — AB (ref 70–99)
POTASSIUM: 4.2 meq/L (ref 3.7–5.3)
Sodium: 135 mEq/L — ABNORMAL LOW (ref 137–147)

## 2013-11-16 LAB — GLUCOSE, CAPILLARY
GLUCOSE-CAPILLARY: 133 mg/dL — AB (ref 70–99)
GLUCOSE-CAPILLARY: 165 mg/dL — AB (ref 70–99)
Glucose-Capillary: 102 mg/dL — ABNORMAL HIGH (ref 70–99)
Glucose-Capillary: 119 mg/dL — ABNORMAL HIGH (ref 70–99)

## 2013-11-16 MED ORDER — BOOST / RESOURCE BREEZE PO LIQD
1.0000 | Freq: Every day | ORAL | Status: DC
  Administered 2013-11-17 – 2013-11-21 (×5): 1 via ORAL

## 2013-11-16 NOTE — Progress Notes (Signed)
Wife in room feeding patient food not compliant with recommended renal or cardiac diet.  Wife also feeding patient without regard to SLP recommendations (feeding slowly, swallowing twice with each bite, and following with small sips of liquid).  Attempted to educate wife regarding patient's delicate condition and necessity of following both diet and SLP recommendations, and consequences of not following either one.  Wife stated "I won't bring him anymore food", however still noted to be feeding patient with technique inconsistent with recommended SLP feeding techniques.

## 2013-11-16 NOTE — Progress Notes (Signed)
Physical Therapy Treatment Patient Details Name: [name] MRN: [id number] DOB: 12/27/1918 Today's Date: 11/16/2013 Time: 6160-7371 PT Time Calculation (min): 26 min  PT Assessment / Plan / Recommendation  History of Present Illness 80 y.o. BIBEMS to [facility] in respiratory distress, hypoxic, reqired ventilator, extubated 1/14. Pt has recent extension of lacunar infarct.   PT Comments   Pt progressing  Follow Up Recommendations  SNF;LTACH     Does the patient have the potential to tolerate intense rehabilitation     Barriers to Discharge        Equipment Recommendations  None recommended by PT    Recommendations for Other Services OT consult  Frequency Min 3X/week   Progress towards PT Goals Progress towards PT goals: Progressing toward goals  Plan Current plan remains appropriate    Precautions / Restrictions Precautions Precautions: Fall Precaution Comments: swallow precautions   Pertinent Vitals/Pain Denies pain    Mobility  Bed Mobility Overal bed mobility: Needs Assistance Bed Mobility: Supine to Sit Supine to sit: Mod assist General bed mobility comments: pt able to move legs over EOB and pull himself upright with R UE however required increased assist to scoot to EOB Transfers Overall transfer level: Needs assistance Equipment used: Rolling walker (2 wheeled);None (x2) Transfers: Sit to/from [employer] Sit to Stand: +2 physical assistance;From elevated surface;Mod assist;Min assist Stand pivot transfers: Mod assist;+2 physical assistance General transfer comment: multi-modal cues and hand over hand  cues for safe technique; requies increase time; bil knee supoport/facilitation of knee extension with initial stand    Exercises     PT Diagnosis:    PT Problem List:   PT Treatment Interventions:     PT Goals (current goals can now be found in the care plan section) Acute Rehab PT Goals Patient Stated Goal: agreed to get  up...reluctantly PT Goal Formulation: With patient Time For Goal Achievement: 11/25/13 Potential to Achieve Goals: Fair  Visit Information  Last PT Received On: 11/16/13 Assistance Needed: +2 History of Present Illness: 80 y.o. BIBEMS to [facility] in respiratory distress, hypoxic, reqired ventilator, extubated 1/14. Pt has recent extension of lacunar infarct.    Subjective Data  Patient Stated Goal: agreed to get up...reluctantly   Cognition  Cognition Arousal/Alertness: Awake/alert Behavior During Therapy: WFL for tasks assessed/performed Overall Cognitive Status: No family/caregiver present to determine baseline cognitive functioning (slow processing at imes)    Balance  Balance Overall balance assessment: Needs assistance Sitting-balance support: Single extremity supported;Feet supported Sitting balance-Leahy Scale: Poor Standing balance support: Bilateral upper extremity supported Standing balance-Leahy Scale: Zero  End of Session PT - End of Session Equipment Utilized During Treatment: Gait belt Activity Tolerance: Patient tolerated treatment well Patient left: in chair;with call bell/phone within reach;with family/visitor present Nurse Communication: Mobility status;Need for lift equipment   GP     [facility] 11/16/2013, 3:15 PM

## 2013-11-16 NOTE — Progress Notes (Signed)
Patient ID: [name], male   DOB: Oct 12, 1919, 80 y.o.   MRN: [id number] S:no new complaints this am.  Breathing is "going smoothly I think" O:BP 123/78  Pulse 79  Temp(Src) 98.6 F (37 C) (Axillary)  Resp 24  Ht 6' (1.829 m)  Wt 89.6 kg (197 lb 8.5 oz)  BMI 26.78 kg/m2  SpO2 98%  Intake/Output Summary (Last 24 hours) at 11/16/13 0837 Last data filed at 11/16/13 0545  Gross per 24 hour  Intake    840 ml  Output   1535 ml  Net   -695 ml   Intake/Output: I/O last 3 completed shifts: In: 960 [P.O.:960] Out: 2110 [Urine:2110]  Intake/Output this shift:    Weight change: -1.3 kg (-2 lb 13.9 oz) FAO:ZHYQMVHGen:elderly WM in NAD CVS:IRR IRR no rub Resp:decreased BS at bases, poor inspiratory effort QIO:NGEXBAbd:obese +BS, soft, NT Ext:tr pedal edema   Recent Labs Lab 11/11/13 0355 11/12/13 0617 11/13/13 0505 11/14/13 0449 11/15/13 0509 11/16/13 0510  NA 139 139 137 136* 136* 135*  K 4.9 4.3 4.4 4.6 4.3 4.2  CL 99 99 98 98 99 98  CO2 20 22 22 20 20 21   GLUCOSE 130* 114* 132* 128* 120* 106*  BUN 60* 76* 84* 84* 92* 94*  CREATININE 2.78* 3.57* 3.96* 4.08* 4.35* 4.38*  CALCIUM 8.8 8.5 8.5 8.7 8.8 8.7   Liver Function Tests: No results found for this basename: AST, ALT, ALKPHOS, BILITOT, PROT, ALBUMIN,  in the last 168 hours No results found for this basename: LIPASE, AMYLASE,  in the last 168 hours No results found for this basename: AMMONIA,  in the last 168 hours CBC:  Recent Labs Lab 11/11/13 0355 11/12/13 0617 11/13/13 0505 11/14/13 0449 11/15/13 0509  WBC 8.7 8.8 8.0 8.3 9.2  HGB 10.9* 9.3* 8.6* 8.8* 8.8*  HCT 34.0* 28.4* 27.3* 27.4* 26.6*  MCV 93.2 92.5 94.1 93.2 91.7  PLT 241 230 230 237 286   Cardiac Enzymes: No results found for this basename: CKTOTAL, CKMB, CKMBINDEX, TROPONINI,  in the last 168 hours CBG:  Recent Labs Lab 11/15/13 0735 11/15/13 1153 11/15/13 1656 11/15/13 2205 11/16/13 0807  GLUCAP 117* 130* 139* 132* 102*    Iron Studies:  Recent  Labs  11/14/13 1405  IRON 14*  TIBC 171*  FERRITIN 696*   Studies/Results: [facility]  11/14/2013   CLINICAL DATA:  Acute on chronic renal disease.  EXAM: RENAL/URINARY TRACT ULTRASOUND COMPLETE  COMPARISON:  Renal ultrasound 05/04/2009.  FINDINGS: Right Kidney:  Length: 9.5 cm. No stone, mass or hydronephrosis is identified. Increased cortical echogenicity is noted.  Left Kidney:  Length: 10.8 cm. No stone, mass or hydronephrosis is identified. Increased cortical echogenicity is noted.  Bladder:  Incompletely distended but otherwise unremarkable.  IMPRESSION: Negative for hydronephrosis or other acute abnormality.  Increased cortical echogenicity of the kidneys compatible with medical renal disease.   Electronically Signed   By: Drusilla Kannerhomas  Dalessio M.D.   On: 11/14/2013 19:55   . antiseptic oral rinse  15 mL Mouth Rinse q12n4p  . atorvastatin  20 mg Oral q1800  . chlorhexidine  15 mL Mouth Rinse BID  . diltiazem  180 mg Oral Daily  . dipyridamole-aspirin  1 capsule Oral BID  . doxazosin  1 mg Oral QHS  . heparin  5,000 Units Subcutaneous Q8H  . hydrALAZINE  25 mg Oral Q8H  . insulin aspart  0-5 Units Subcutaneous QHS  . insulin aspart  0-9 Units Subcutaneous TID WC  .  metoprolol  50 mg Oral BID  . pantoprazole  40 mg Oral Daily  . torsemide  20 mg Oral Daily    BMET    Component Value Date/Time   NA 135* 11/16/2013 0510   K 4.2 11/16/2013 0510   CL 98 11/16/2013 0510   CO2 21 11/16/2013 0510   GLUCOSE 106* 11/16/2013 0510   BUN 94* 11/16/2013 0510   CREATININE 4.38* 11/16/2013 0510   CALCIUM 8.7 11/16/2013 0510   GFRNONAA 11* 11/16/2013 0510   GFRAA 12* 11/16/2013 0510   CBC    Component Value Date/Time   WBC 9.2 11/15/2013 0509   RBC 2.90* 11/15/2013 0509   HGB 8.8* 11/15/2013 0509   HCT 26.6* 11/15/2013 0509   PLT 286 11/15/2013 0509   MCV 91.7 11/15/2013 0509   MCH 30.3 11/15/2013 0509   MCHC 33.1 11/15/2013 0509   RDW 14.2 11/15/2013 0509   LYMPHSABS 2.7 11/07/2013 0444    MONOABS 1.5* 11/07/2013 0444   EOSABS 0.1 11/07/2013 0444   BASOSABS 0.0 11/07/2013 0444     Assessment/Plan:  1. AKI/CKD stage 4: ?cardiorenal syndrome vs. ATN due to acute illness vs. BOO.  1. bladder scan revealed only 54cc. Renal US without hydro. 2. Scr has plateaued with diuresis, no sig change from yesterday 3. Agree with conservative care and he is not Castillo candidate for HD.  4. Agree with demadex for SOB but he may require hospice eval if his renal function continues to deteriorate 5. Cont to follow UOP and daily Scr and renal dose meds  2. ABLA- follow h/h which appears to have stabilized. 3. Hyponatremia- related to CHF and AKI/CKD.  Cont to follow however if it continues to drop with diuresis, may becoming intravascular volume depleted. 4. Cardiorenal syndrome/CHF- currently on torsemide and is finally negative with I's/O's 1. Cont to follow daily weights and I's/O's  2. overall breathing better per his report but he remains somnolent. 5. CAD s/p NSTEMI- Cards following 6. Iron deficiency- may or may not benefit from IV feraheme 1020mg , will d/w primary team. 7. COPD 8. Aspiration PNA- completed abx 9. CVA- with left hemiparesis 10. Castillo fib, rate controlled 11. DM- per primary svc 12. Dispo- pending progression but may require SNF vs. Hospice (especially if his Scr continues to climb) 13.   [name]

## 2013-11-16 NOTE — Progress Notes (Signed)
[facility] PROGRESS NOTE  [name]:[id number] DOB: 1919/01/15 DOA: 11/07/2013 PCP: Colette Ribas, MD  TRANSFER from PCCM 1/16 89/M with multiple med issues namely CAD, CHF, recent CVA, CKD 4, Afib, admitted on 1/12 to PCCM, with ACute respiratory failure, due to pneumonia/NSTEMI requiring intubation. He is being followed by Cardiology and managed medically for NSTEMI due to CKD 4. He was extubated and transferred to [facility]. Unfortunately through this hospitalization, his kidney function has being declining, renal following now, he is being diuresed, but not a dialysis candidate and may need Palliative involvement   Assessment/Plan: 1. Acute Hypoxic resp failure -due to aspiration pneumonia and CHF/NSTEMI -s/p VDRF -extubated 1/14 -O2 weaned -volume overloaded, weight continues to trend up even though net negative of 500 cc noted>> cardiology recommending to continue Demadex 20 today  2. NSTEMI/Pulm edema/cardio-renal syndrome -volume overloaded, lasix IV today again, continue demadex per Cards -Renal following -on med management only for NSTEMI due to advanced age/CKD -EF 55-60% -continue aggrenox/BB/statin -appreciate Cards input  3. Aspiration pneumonia -completed 7 days of abx   4.  ARF on CKD 4 -suspect ATN/cardiorenal syndrome -urine output fair -he was being gently hydrated for 2 days, without improvement in creatinine -still volume overloaded, continue demadex, Iv lasix today again -he is not a dialysis candidate ,Dr Jomarie Longs called and explained this to wife on 1/19, Iand discussed with Renal on 1/20, will need palliative consult in next day or two if his kidneys dont improve -Creatinine today 4.38 (from 4.35 on 1/20) -per renal today no significant change in creatinine, will follow.  5. Afib, HR improved -continue diltiazem and metoprolol -on aggrenox started per Neuro last month after CVA -per cardiology patient will need long-term treatment if  aggressive treatments desired, will follow up with renal if cr not improving for input on ?palliative consult vs continuing aggressive treatment  6. DM -stable, SSI  7. H/o CVA -aggrenox, Fu with NEuro  DVT proph: hep SQ  Ambulate, OOB to chair Pt eval pending  Code Status: DNR Family Communication: None at bedside  Disposition Plan: depending on clinical course   Consultants:  cards  HPI/Subjective: Complaining of some shortness of breath this a.m., denies chest pain.  Objective: Filed Vitals:   11/16/13 1455  BP: 124/68  Pulse: 73  Temp: 98.1 F (36.7 C)  Resp: 22    Intake/Output Summary (Last 24 hours) at 11/16/13 1642 Last data filed at 11/16/13 1500  Gross per 24 hour  Intake    480 ml  Output   1035 ml  Net   -555 ml   Filed Weights   11/14/13 0512 11/15/13 0609 11/16/13 0544  Weight: 88.8 kg (195 lb 12.3 oz) 90.9 kg (200 lb 6.4 oz) 89.6 kg (197 lb 8.5 oz)    Exam:   General: alert, oriented to self, place  Cardiovascular: S1S2/RRR  Respiratory: diminished at bases, but clear otherwise  Abdomen: soft, Nt, BS present  Musculoskeletal: trace edema c/c  Data Reviewed: Basic Metabolic Panel:  Recent Labs Lab 11/12/13 0617 11/13/13 0505 11/14/13 0449 11/15/13 0509 11/16/13 0510  NA 139 137 136* 136* 135*  K 4.3 4.4 4.6 4.3 4.2  CL 99 98 98 99 98  CO2 22 22 20 20 21   GLUCOSE 114* 132* 128* 120* 106*  BUN 76* 84* 84* 92* 94*  CREATININE 3.57* 3.96* 4.08* 4.35* 4.38*  CALCIUM 8.5 8.5 8.7 8.8 8.7   Liver Function Tests: No results found for this basename: AST, ALT, ALKPHOS,  BILITOT, PROT, ALBUMIN,  in the last 168 hours No results found for this basename: LIPASE, AMYLASE,  in the last 168 hours No results found for this basename: AMMONIA,  in the last 168 hours CBC:  Recent Labs Lab 11/11/13 0355 11/12/13 0617 11/13/13 0505 11/14/13 0449 11/15/13 0509  WBC 8.7 8.8 8.0 8.3 9.2  HGB 10.9* 9.3* 8.6* 8.8* 8.8*  HCT 34.0* 28.4*  27.3* 27.4* 26.6*  MCV 93.2 92.5 94.1 93.2 91.7  PLT 241 230 230 237 286   Cardiac Enzymes: No results found for this basename: CKTOTAL, CKMB, CKMBINDEX, TROPONINI,  in the last 168 hours BNP (last 3 results)  Recent Labs  10/05/13 1344 11/07/13 0444  PROBNP 3785.0* 8303.0*   CBG:  Recent Labs Lab 11/15/13 1153 11/15/13 1656 11/15/13 2205 11/16/13 0807 11/16/13 1211  GLUCAP 130* 139* 132* 102* 133*    Recent Results (from the past 240 hour(s))  MRSA PCR SCREENING     Status: None   Collection Time    11/07/13 12:38 PM      Result Value Range Status   MRSA by PCR NEGATIVE  NEGATIVE Final   Comment:            The GeneXpert MRSA Assay (FDA     approved for NASAL specimens     only), is one component of a     comprehensive MRSA colonization     surveillance program. It is not     intended to diagnose MRSA     infection nor to guide or     monitor treatment for     MRSA infections.  CULTURE, RESPIRATORY (NON-EXPECTORATED)     Status: None   Collection Time    11/07/13 12:39 PM      Result Value Range Status   Specimen Description TRACHEAL ASPIRATE   Final   Special Requests NONE   Final   Gram Stain     Final   Value: MODERATE WBC PRESENT,BOTH PMN AND MONONUCLEAR     RARE SQUAMOUS EPITHELIAL CELLS PRESENT     RARE GRAM POSITIVE COCCI     IN PAIRS     Performed at [facility]   Culture     Final   Value: Non-Pathogenic Oropharyngeal-type Flora Isolated.     Performed at [facility]   Report Status 11/10/2013 FINAL   Final  CULTURE, BLOOD (ROUTINE X 2)     Status: None   Collection Time    11/07/13  1:25 PM      Result Value Range Status   Specimen Description BLOOD LEFT HAND   Final   Special Requests BOTTLES DRAWN AEROBIC ONLY 3CC   Final   Culture  Setup Time     Final   Value: 11/07/2013 16:02     Performed at [facility]   Culture     Final   Value: NO GROWTH 5 DAYS     Performed at [facility]   Report Status  11/13/2013 FINAL   Final  CULTURE, BLOOD (ROUTINE X 2)     Status: None   Collection Time    11/07/13  1:30 PM      Result Value Range Status   Specimen Description BLOOD LEFT HAND   Final   Special Requests BOTTLES DRAWN AEROBIC AND ANAEROBIC 5CC   Final   Culture  Setup Time     Final   Value: 11/07/2013 16:03     Performed at [facility]  Culture     Final   Value: NO GROWTH 5 DAYS     Performed at [facility]   Report Status 11/13/2013 FINAL   Final  CLOSTRIDIUM DIFFICILE BY PCR     Status: None   Collection Time    11/10/13  9:35 AM      Result Value Range Status   C difficile by pcr NEGATIVE  NEGATIVE Final   Comment: Performed at [facility]     Studies: US Renal Port  11/14/2013   CLINICAL DATA:  Acute on chronic renal disease.  EXAM: RENAL/URINARY TRACT ULTRASOUND COMPLETE  COMPARISON:  Renal ultrasound 05/04/2009.  FINDINGS: Right Kidney:  Length: 9.5 cm. No stone, mass or hydronephrosis is identified. Increased cortical echogenicity is noted.  Left Kidney:  Length: 10.8 cm. No stone, mass or hydronephrosis is identified. Increased cortical echogenicity is noted.  Bladder:  Incompletely distended but otherwise unremarkable.  IMPRESSION: Negative for hydronephrosis or other acute abnormality.  Increased cortical echogenicity of the kidneys compatible with medical renal disease.   Electronically Signed   By: Drusilla Kanner M.D.   On: 11/14/2013 19:55    Scheduled Meds: . antiseptic oral rinse  15 mL Mouth Rinse q12n4p  . atorvastatin  20 mg Oral q1800  . chlorhexidine  15 mL Mouth Rinse BID  . diltiazem  180 mg Oral Daily  . dipyridamole-aspirin  1 capsule Oral BID  . doxazosin  1 mg Oral QHS  . feeding supplement (RESOURCE BREEZE)  1 Container Oral q1800  . heparin  5,000 Units Subcutaneous Q8H  . hydrALAZINE  25 mg Oral Q8H  . insulin aspart  0-5 Units Subcutaneous QHS  . insulin aspart  0-9 Units Subcutaneous TID WC  . metoprolol  50 mg  Oral BID  . pantoprazole  40 mg Oral Daily  . torsemide  20 mg Oral Daily   Continuous Infusions:    Active Problems:   CVA (cerebral infarction)   HTN (hypertension)   CKD (chronic kidney disease), stage III   CAD (coronary artery disease)   AAA (abdominal aortic aneurysm)   Pacemaker - Medtronic adapta dual-chamber implanted 2010   Acute respiratory distress   CHF (congestive heart failure)   Acute respiratory failure   Palliative care status   Time spent:   [facility]  [facility] Pager [phone]. If 7PM-7AM, please contact night-coverage at [email or web address], password [facility] 11/16/2013, 4:42 PM  LOS: 9 days

## 2013-11-16 NOTE — Progress Notes (Signed)
Subjective:  Some dyspnea; no chest pain  Objective:  Vital Signs in the last 24 hours: BP 123/78  Pulse 79  Temp(Src) 98.6 F (37 C) (Axillary)  Resp 24  Ht 6' (1.829 m)  Wt 197 lb 8.5 oz (89.6 kg)  BMI 26.78 kg/m2  SpO2 98%  Physical Exam: Elderly male in no acute distress HEENT: normal Lungs:  Mildly diminished BS bases Cardiac:  Irregular rhythm Abdomen:  Soft, nontender, no masses Extremities:  Trace to 1+ edema present  Intake/Output from previous day: 01/20 0701 - 01/21 0700 In: 840 [P.O.:840] Out: 1535 [Urine:1535] Weight Filed Weights   11/14/13 0512 11/15/13 0609 11/16/13 0544  Weight: 195 lb 12.3 oz (88.8 kg) 200 lb 6.4 oz (90.9 kg) 197 lb 8.5 oz (89.6 kg)    Lab Results: Basic Metabolic Panel:  Recent Labs  26/33/35 0509 11/16/13 0510  NA 136* 135*  K 4.3 4.2  CL 99 98  CO2 20 21  GLUCOSE 120* 106*  BUN 92* 94*  CREATININE 4.35* 4.38*    CBC:  Recent Labs  11/14/13 0449 11/15/13 0509  WBC 8.3 9.2  HGB 8.8* 8.8*  HCT 27.4* 26.6*  MCV 93.2 91.7  PLT 237 286    BNP    Component Value Date/Time   PROBNP 8303.0* 11/07/2013 0444    Telemetry: Atrial fibrillation converted to sinus  Assessment/Plan:  1. Atrial fibrillation 2. Recent stroke 3. Recent respiratory distress with intubation 4. Acute on chronic renal failure with worsening following diuresis 5. Anemia that continues to worsen  Recommendations: Patient continues to have increasing weight. He remains volume overloaded. However, renal function continues to deteriorate. Will continue demadex 20 mg today; nephrology following; if renal function continues to deteriorate, hospice would be appropriate. Prognosis appears to be poor. Continue Cardizem and metoprolol; patient has converted to sinus. Continue Aggrenox. Given prior CVA the patient needs long-term anticoagulation if continued aggressive therapy felt to be warranted.  We will sign off; please call with  questions.   [name] MD [facility]  11/16/2013, 8:22 AM

## 2013-11-16 NOTE — Progress Notes (Addendum)
NUTRITION FOLLOW UP  Intervention:   - Recommend Resource Breeze once daily - Will continue to monitor   Nutrition Dx:   Inadequate oral intake related to inability to eat as evidenced by NPO, mouth sores- ongoing, no longer r/t NPO status  Goal:   TF to meet >90% of estimated nutritional needs - not met but increasing towards goal   New Goal: Pt to meet >/= 90% of their estimated nutrition needs    Monitor:   Weights, labs, swallow profile, renal profile, total protein/energy intake   Assessment:   Pt with hx of cardiac, vascular and cerebral disease - presents with acute SOB in distress - according to EMS, the pt was found by his wife with severe distress and unresponsive - they found him to have sat's of 66%, they started bipap, gave a dose of albuterol during transport - he had remained obtunded but barely able to arouse to voice. 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 He has COPD and AAA.   1/12 - Pt intubated today r/t acute respiratory failure favoring aspiration pneumonia in setting of recent CVA. MD in room currently, talking with family. RD consulted for TF management. Initiated TF of Vital High Protein start at 56m/hr and increase by 161mevery 4 hours to goal of 4512mr.   1/13 - Pt alone in room. Pt getting Vital High Protein at 38m60m currently via NGT. Per RN, pt tolerating TF well with no residuals this morning. Propofol rate decreased since yesterday. Per MD notes, plan is to continue TF today.   1/15 - Reviewed events since last RD visit. Pt extubated yesterday. Pt continues to get TF via NGT which RN reports pt tolerating well with only 10ml71miduals this morning. SLP in room evaluating pt.  Current TF: Vital High Protein via NGT at 55ml/39mhich provides 1320 calories, 116g protein, and 1103ml f57mwater and meets  63% estimated calorie needs, 100% estimated protein needs   1/21: -Diet advanced to regular on 1/15 -Placed on Renal 60/70 diet on 1/19 r/t worsening kidney function -MD noted pt not candidate for HD -Hyponatremic r/t CHF and AKI/CKD per MD note. Monitor I/Os -Pt with 50-70% PO intake -Reported tongues/mouth sores that cause difficulty with swallowing and chewing solid foods. Can tolerate liquids. Tries to eat as through the pain as he understands importance of nutrition -Drank vanilla Boost pta; however, d/t current renal function, pt may benefit from [employer] is lower protein/K/Phos alternative   Height: Ht Readings from Last 1 Encounters:  11/07/13 6' (1.829 m)    Weight Status:   Wt Readings from Last 1 Encounters:  11/16/13 197 lb 8.5 oz (89.6 kg)  Admit wt:        186 lb (84.3 kg)  Re-estimated needs:  Kcal: 2100-2300 Protein: 101-120g Fluid: >2.1L/day  Skin: trace-+1 edema  Diet Order: Renal   Intake/Output Summary (Last 24 hours) at 11/16/13 1311 Last data filed at 11/16/13 0900  Gross per 24 hour  Intake    600 ml  Output   1535 ml  Net   -935 ml    Last BM: 1/17   Labs:   Recent Labs Lab 11/14/13 0449 11/15/13 0509 11/16/13 0510  NA 136* 136* 135*  K 4.6 4.3 4.2  CL 98 99 98  CO2 20 20 21  BUN 84* 92* 94*  CREATININE 4.08* 4.35* 4.38*  CALCIUM 8.7 8.8 8.7  GLUCOSE 128* 120* 106*    CBG (last 3)   Recent Labs  11/15/13 2205 11/16/13 0807 11/16/13 1211  GLUCAP 132* 102* 133*    Scheduled Meds: . antiseptic oral rinse  15 mL Mouth Rinse q12n4p  . atorvastatin  20 mg Oral q1800  . chlorhexidine  15 mL Mouth Rinse BID  . diltiazem  180 mg Oral Daily  . dipyridamole-aspirin  1 capsule Oral BID  . doxazosin  1 mg Oral QHS  . heparin  5,000 Units Subcutaneous Q8H  . hydrALAZINE  25 mg Oral Q8H  . insulin aspart  0-5 Units Subcutaneous QHS  . insulin aspart  0-9 Units Subcutaneous TID WC  . metoprolol  50 mg Oral BID   . pantoprazole  40 mg Oral Daily  . torsemide  20 mg Oral Daily    Continuous Infusions:  None    Atlee Abide MS RD LDN Clinical Dietitian [phone]

## 2013-11-17 LAB — BASIC METABOLIC PANEL
BUN: 102 mg/dL — ABNORMAL HIGH (ref 6–23)
CALCIUM: 8.7 mg/dL (ref 8.4–10.5)
CO2: 21 meq/L (ref 19–32)
Chloride: 97 mEq/L (ref 96–112)
Creatinine, Ser: 4.23 mg/dL — ABNORMAL HIGH (ref 0.50–1.35)
GFR calc Af Amer: 13 mL/min — ABNORMAL LOW (ref >90)
GFR, EST NON AFRICAN AMERICAN: 11 mL/min — AB (ref >90)
Glucose, Bld: 118 mg/dL — ABNORMAL HIGH (ref 70–99)
POTASSIUM: 4.4 meq/L (ref 3.7–5.3)
SODIUM: 135 meq/L — AB (ref 137–147)

## 2013-11-17 LAB — GLUCOSE, CAPILLARY
GLUCOSE-CAPILLARY: 111 mg/dL — AB (ref 70–99)
Glucose-Capillary: 124 mg/dL — ABNORMAL HIGH (ref 70–99)
Glucose-Capillary: 114 mg/dL — ABNORMAL HIGH (ref 70–99)

## 2013-11-17 MED ORDER — ACETAMINOPHEN 325 MG PO TABS
650.0000 mg | ORAL_TABLET | Freq: Four times a day (QID) | ORAL | Status: DC | PRN
  Administered 2013-11-17 – 2013-11-21 (×6): 650 mg via ORAL
  Filled 2013-11-17 (×6): qty 2

## 2013-11-17 MED ORDER — SODIUM CHLORIDE 0.9 % IJ SOLN
3.0000 mL | Freq: Two times a day (BID) | INTRAMUSCULAR | Status: DC
  Administered 2013-11-17 – 2013-11-22 (×9): 3 mL via INTRAVENOUS

## 2013-11-17 NOTE — Progress Notes (Signed)
Patient ID: Darius PallWilliam E Castillo, male   DOB: August 18, 1919, 80 y.o.   MRN: [id number] S:Pt sitting up in bed, without new complaints "feel good" O:BP 135/72  Pulse 72  Temp(Src) 98.1 F (36.7 C) (Oral)  Resp 20  Ht 6' (1.829 m)  Wt 88.8 kg (195 lb 12.3 oz)  BMI 26.55 kg/m2  SpO2 97%  Intake/Output Summary (Last 24 hours) at 11/17/13 [id number] Last data filed at 11/17/13 [id number]  Gross per 24 hour  Intake    680 ml  Output    775 ml  Net    -95 ml   Intake/Output: I/O last 3 completed shifts: In: 680 [P.O.:680] Out: 1810 [Urine:1810]  Intake/Output this shift:    Weight change: -0.8 kg (-1 lb 12.2 oz) Gen:WD elderly WM sitting up in bed, appears weak/fatigued CVS:no rub Resp:decreased BS at bases YNW:GNFAOAbd:obese +BS, soft, nt Ext:tr pretib edema   Recent Labs Lab 11/11/13 0355 11/12/13 0617 11/13/13 0505 11/14/13 0449 11/15/13 0509 11/16/13 0510 11/17/13 0444  NA 139 139 137 136* 136* 135* 135*  K 4.9 4.3 4.4 4.6 4.3 4.2 4.4  CL 99 99 98 98 99 98 97  CO2 20 22 22 20 20 21 21   GLUCOSE 130* 114* 132* 128* 120* 106* 118*  BUN 60* 76* 84* 84* 92* 94* 102*  CREATININE 2.78* 3.57* 3.96* 4.08* 4.35* 4.38* 4.23*  CALCIUM 8.8 8.5 8.5 8.7 8.8 8.7 8.7   Liver Function Tests: No results found for this basename: AST, ALT, ALKPHOS, BILITOT, PROT, ALBUMIN,  in the last 168 hours No results found for this basename: LIPASE, AMYLASE,  in the last 168 hours No results found for this basename: AMMONIA,  in the last 168 hours CBC:  Recent Labs Lab 11/11/13 0355 11/12/13 0617 11/13/13 0505 11/14/13 0449 11/15/13 0509  WBC 8.7 8.8 8.0 8.3 9.2  HGB 10.9* 9.3* 8.6* 8.8* 8.8*  HCT 34.0* 28.4* 27.3* 27.4* 26.6*  MCV 93.2 92.5 94.1 93.2 91.7  PLT 241 230 230 237 286   Cardiac Enzymes: No results found for this basename: CKTOTAL, CKMB, CKMBINDEX, TROPONINI,  in the last 168 hours CBG:  Recent Labs Lab 11/16/13 0807 11/16/13 1211 11/16/13 1730 11/16/13 2142 11/17/13 0752  GLUCAP 102* 133*  119* 165* 111*    Iron Studies:  Recent Labs  11/14/13 1405  IRON 14*  TIBC 171*  FERRITIN 696*   Studies/Results: No results found. Marland Kitchen. antiseptic oral rinse  15 mL Mouth Rinse q12n4p  . atorvastatin  20 mg Oral q1800  . chlorhexidine  15 mL Mouth Rinse BID  . diltiazem  180 mg Oral Daily  . dipyridamole-aspirin  1 capsule Oral BID  . doxazosin  1 mg Oral QHS  . feeding supplement (RESOURCE BREEZE)  1 Container Oral q1800  . heparin  5,000 Units Subcutaneous Q8H  . hydrALAZINE  25 mg Oral Q8H  . insulin aspart  0-5 Units Subcutaneous QHS  . insulin aspart  0-9 Units Subcutaneous TID WC  . metoprolol  50 mg Oral BID  . pantoprazole  40 mg Oral Daily  . sodium chloride  3 mL Intravenous Q12H  . torsemide  20 mg Oral Daily    BMET    Component Value Date/Time   NA 135* 11/17/2013 0444   K 4.4 11/17/2013 0444   CL 97 11/17/2013 0444   CO2 21 11/17/2013 0444   GLUCOSE 118* 11/17/2013 0444   BUN 102* 11/17/2013 0444   CREATININE 4.23* 11/17/2013 0444   CALCIUM 8.7 11/17/2013  0444   GFRNONAA 11* 11/17/2013 0444   GFRAA 13* 11/17/2013 0444   CBC    Component Value Date/Time   WBC 9.2 11/15/2013 0509   RBC 2.90* 11/15/2013 0509   HGB 8.8* 11/15/2013 0509   HCT 26.6* 11/15/2013 0509   PLT 286 11/15/2013 0509   MCV 91.7 11/15/2013 0509   MCH 30.3 11/15/2013 0509   MCHC 33.1 11/15/2013 0509   RDW 14.2 11/15/2013 0509   LYMPHSABS 2.7 11/07/2013 0444   MONOABS 1.5* 11/07/2013 0444   EOSABS 0.1 11/07/2013 0444   BASOSABS 0.0 11/07/2013 0444     Assessment/Plan:  1. AKI/CKD stage 4: cardiorenal syndrome vs. ATN, Scr with some improvement but not significantly different overall GFR.  1. bladder scan revealed only 54cc. Renal US without hydro. 2. Scr has started to decline but overall, no sig change from yesterday 3. Agree with conservative care and he is not a candidate for HD.  4. Agree with demadex for SOB and agree with palliative care/hospice eval given his overall decline and poor  prognosis. 5. Cont to follow UOP and daily Scr and renal dose meds  2. ABLA- follow h/h which appears to have stabilized. 3. Hyponatremia- related to CHF and AKI/CKD. Cont to follow however if it continues to drop with diuresis, may becoming intravascular volume depleted. 4. Cardiorenal syndrome/CHF- currently on torsemide and is finally negative with I's/O's  1. Cont to follow daily weights and I's/O's  2. overall breathing better per his report but he remains somnolent and resp appears labored. 5. CAD s/p NSTEMI- Cards with nothing more to add 6. Iron deficiency- may or may not benefit from IV feraheme 1020mg , will d/w primary team. 7. COPD 8. Aspiration PNA- completed abx 9. CVA- with left hemiparesis 10. A fib, rate controlled 11. DM- per primary svc 12. Dispo- pending progression but may require SNF vs. Hospice (especially if his Scr continues to climb) 13.   Colleen Kotlarz A

## 2013-11-17 NOTE — Progress Notes (Signed)
[facility] PROGRESS NOTE  RUDOLF MANISCALCO HLK:[id number] DOB: [birth year] DOA: 11/07/2013 PCP: Colette Ribas, MD  TRANSFER from PCCM 1/16 89/M with multiple med issues namely CAD, CHF, recent CVA, CKD 4, Afib, admitted on 1/12 to PCCM, with ACute respiratory failure, due to pneumonia/NSTEMI requiring intubation. He is being followed by Cardiology and managed medically for NSTEMI due to CKD 4. He was extubated and transferred to [facility]. Unfortunately through this hospitalization, his kidney function has being declining, renal following now, he is being diuresed, but not a dialysis candidate and may need Palliative involvement   Assessment/Plan: 1. Acute Hypoxic resp failure -due to aspiration pneumonia and CHF/NSTEMI -s/p VDRF -extubated 1/14 -O2 weaned -volume overloaded, weight trending down today,  continue Demadex 20 daily as recommended -Continue when necessary nebs 2. NSTEMI/Pulm edema/cardio-renal syndrome -volume overloaded, lasix IV today again, continue demadex per Cards -Renal following -on med management only for NSTEMI due to advanced age/CKD -EF 55-60% -continue aggrenox/BB/statin  3. Aspiration pneumonia -completed 7 days of abx   4.  ARF on CKD 4 -suspect ATN/cardiorenal syndrome -urine output fair -he was being gently hydrated for 2 days, without improvement in creatinine -still volume overloaded, continue demadex, Iv lasix today again -he is not a dialysis candidate ,Dr Jomarie Longs called and explained this to wife on 1/19. -Creatinine today 4.23 (from 4.38 on 1/21) -I discussed with Renal on today and as above renal recommends palliative consultation fo goals-discussed with patient and I have consulted palliative -per renal today no significant change in creatinine, will follow.  5. Afib, HR improved -continue diltiazem and metoprolol -on aggrenox started per Neuro last month after CVA -per cardiology patient will need long-term treatment if aggressive  treatments desired. -Discussed with renal and Dr.Colodonato recommends palliative consultation, so for now holding off for anticoagulation pending palliative goals of care  6. DM -stable, SSI  7. H/o CVA -aggrenox, Fu with NEuro  DVT proph: hep SQ  Ambulate, OOB to chair Pt eval pending  Code Status: DNR Family Communication: None at bedside  Disposition Plan: depending on clinical course   Consultants:  cards  HPI/Subjective: States his breathing is 'smooth' although he appears mildly dyspneic, denies chest pain.  Objective: Filed Vitals:   11/17/13 1436  BP: 136/80  Pulse: 63  Temp: 98.5 F (36.9 C)  Resp: 18    Intake/Output Summary (Last 24 hours) at 11/17/13 1823 Last data filed at 11/17/13 1700  Gross per 24 hour  Intake    750 ml  Output    975 ml  Net   -225 ml   Filed Weights   11/15/13 0609 11/16/13 0544 11/17/13 0512  Weight: 90.9 kg (200 lb 6.4 oz) 89.6 kg (197 lb 8.5 oz) 88.8 kg (195 lb 12.3 oz)    Exam:   General: alert, oriented to self, place, mildly tachypneic  Cardiovascular: S1S2/RRR  Respiratory: diminished at bases, but clear otherwise  Abdomen: soft, Nt, BS present  Musculoskeletal: trace edema c/c  Data Reviewed: Basic Metabolic Panel:  Recent Labs Lab 11/13/13 0505 11/14/13 0449 11/15/13 0509 11/16/13 0510 11/17/13 0444  NA 137 136* 136* 135* 135*  K 4.4 4.6 4.3 4.2 4.4  CL 98 98 99 98 97  CO2 22 20 20 21 21   GLUCOSE 132* 128* 120* 106* 118*  BUN 84* 84* 92* 94* 102*  CREATININE 3.96* 4.08* 4.35* 4.38* 4.23*  CALCIUM 8.5 8.7 8.8 8.7 8.7   Liver Function Tests: No results found for this basename: AST, ALT, ALKPHOS,  BILITOT, PROT, ALBUMIN,  in the last 168 hours No results found for this basename: LIPASE, AMYLASE,  in the last 168 hours No results found for this basename: AMMONIA,  in the last 168 hours CBC:  Recent Labs Lab 11/11/13 0355 11/12/13 0617 11/13/13 0505 11/14/13 0449 11/15/13 0509  WBC 8.7  8.8 8.0 8.3 9.2  HGB 10.9* 9.3* 8.6* 8.8* 8.8*  HCT 34.0* 28.4* 27.3* 27.4* 26.6*  MCV 93.2 92.5 94.1 93.2 91.7  PLT 241 230 230 237 286   Cardiac Enzymes: No results found for this basename: CKTOTAL, CKMB, CKMBINDEX, TROPONINI,  in the last 168 hours BNP (last 3 results)  Recent Labs  10/05/13 1344 11/07/13 0444  PROBNP 3785.0* 8303.0*   CBG:  Recent Labs Lab 11/16/13 1730 11/16/13 2142 11/17/13 0752 11/17/13 1126 11/17/13 1648  GLUCAP 119* 165* 111* 124* 114*    Recent Results (from the past 240 hour(s))  CLOSTRIDIUM DIFFICILE BY PCR     Status: None   Collection Time    11/10/13  9:35 AM      Result Value Range Status   C difficile by pcr NEGATIVE  NEGATIVE Final   Comment: Performed at [facility]     Studies: No results found.  Scheduled Meds: . antiseptic oral rinse  15 mL Mouth Rinse q12n4p  . atorvastatin  20 mg Oral q1800  . chlorhexidine  15 mL Mouth Rinse BID  . diltiazem  180 mg Oral Daily  . dipyridamole-aspirin  1 capsule Oral BID  . doxazosin  1 mg Oral QHS  . feeding supplement (RESOURCE BREEZE)  1 Container Oral q1800  . heparin  5,000 Units Subcutaneous Q8H  . hydrALAZINE  25 mg Oral Q8H  . insulin aspart  0-5 Units Subcutaneous QHS  . insulin aspart  0-9 Units Subcutaneous TID WC  . metoprolol  50 mg Oral BID  . pantoprazole  40 mg Oral Daily  . sodium chloride  3 mL Intravenous Q12H  . torsemide  20 mg Oral Daily   Continuous Infusions:    Active Problems:   CVA (cerebral infarction)   HTN (hypertension)   CKD (chronic kidney disease), stage III   CAD (coronary artery disease)   AAA (abdominal aortic aneurysm)   Pacemaker - Medtronic adapta dual-chamber implanted 2010   Acute respiratory distress   CHF (congestive heart failure)   Acute respiratory failure   Palliative care status   Time spent: 25min   [facility] Eye Surgical CenterVIYUOH,Adalyne Lovick C  [facility] Pager [phone]. If 7PM-7AM, please contact night-coverage at  [email or web address], password [email or web address] 11/17/2013, 6:23 PM  LOS: 10 days

## 2013-11-17 NOTE — Progress Notes (Signed)
Thank you for consulting the Palliative Medicine Team at [facility] to meet your patient's and family's needs.   The reason that you asked [place] to see your patient is  For GOC:  We have scheduled your patient for a meeting: 1/23 2pm family request wife has doctors appointment  The Surrogate decision make HQ:PRFFM, June spouse Contact information: [phone]  Other family members that need to be present: per spouse  Your patient is able/unable to participate: +/-  Additional Narrative:  Fue Cervenka L. Ladona Ridgel, MD MBA The Palliative Medicine Team at [facility] Phone: [phone] Pager: [phone]

## 2013-11-18 DIAGNOSIS — R5381 Other malaise: Secondary | ICD-10-CM

## 2013-11-18 DIAGNOSIS — R5383 Other fatigue: Secondary | ICD-10-CM

## 2013-11-18 LAB — GLUCOSE, CAPILLARY
GLUCOSE-CAPILLARY: 122 mg/dL — AB (ref 70–99)
Glucose-Capillary: 160 mg/dL — ABNORMAL HIGH (ref 70–99)
Glucose-Capillary: 131 mg/dL — ABNORMAL HIGH (ref 70–99)
Glucose-Capillary: 116 mg/dL — ABNORMAL HIGH (ref 70–99)
Glucose-Capillary: 153 mg/dL — ABNORMAL HIGH (ref 70–99)

## 2013-11-18 LAB — CBC
HCT: 24.4 % — ABNORMAL LOW (ref 39.0–52.0)
Hemoglobin: 7.9 g/dL — ABNORMAL LOW (ref 13.0–17.0)
MCH: 29.7 pg (ref 26.0–34.0)
MCHC: 32.4 g/dL (ref 30.0–36.0)
MCV: 91.7 fL (ref 78.0–100.0)
PLATELETS: 311 10*3/uL (ref 150–400)
RBC: 2.66 MIL/uL — ABNORMAL LOW (ref 4.22–5.81)
RDW: 14.3 % (ref 11.5–15.5)
WBC: 9 10*3/uL (ref 4.0–10.5)

## 2013-11-18 LAB — BASIC METABOLIC PANEL
BUN: 105 mg/dL — AB (ref 6–23)
CALCIUM: 8.6 mg/dL (ref 8.4–10.5)
CHLORIDE: 96 meq/L (ref 96–112)
CO2: 21 meq/L (ref 19–32)
CREATININE: 4.22 mg/dL — AB (ref 0.50–1.35)
GFR calc Af Amer: 13 mL/min — ABNORMAL LOW (ref >90)
GFR calc non Af Amer: 11 mL/min — ABNORMAL LOW (ref >90)
Glucose, Bld: 117 mg/dL — ABNORMAL HIGH (ref 70–99)
Potassium: 4.7 mEq/L (ref 3.7–5.3)
Sodium: 134 mEq/L — ABNORMAL LOW (ref 137–147)

## 2013-11-18 NOTE — Progress Notes (Signed)
Physical Therapy Treatment Patient Details Name: BRYCESON BRODEUR MRN: [id number] DOB: 1919-06-24 Today's Date: 11/18/2013 Time: 8338-2505 PT Time Calculation (min): 21 min  PT Assessment / Plan / Recommendation  History of Present Illness 80 y.o. BIBEMS to [facility] in respiratory distress, hypoxic, reqired ventilator, extubated 1/14. Pt has recent extension of lacunar infarct.   PT Comments   Pt assisted OOB to [facility] and then over to recliner.  Pt required more assist to recliner due to fatigue from sitting on BSC.  Nsg staff aware to use lift back to bed.  Pt also encouraged to keep L UE elevated and open/close hand to best of his ability due to edema.  Plans per chart to d/c to SNF however palliative meeting for GOC today as well.   Follow Up Recommendations  SNF     Does the patient have the potential to tolerate intense rehabilitation     Barriers to Discharge        Equipment Recommendations  None recommended by PT    Recommendations for Other Services    Frequency Min 3X/week   Progress towards PT Goals Progress towards PT goals: Progressing toward goals (very slowly)  Plan Current plan remains appropriate    Precautions / Restrictions Precautions Precautions: Fall Precaution Comments: swallow precautions   Pertinent Vitals/Pain No specific complaints    Mobility  Bed Mobility Overal bed mobility: Needs Assistance Bed Mobility: Supine to Sit Supine to sit: Min assist General bed mobility comments: pt able to move legs over EOB and pull himself upright with R UE however required assist to scoot L side to EOB Transfers Overall transfer level: Needs assistance Equipment used: Rolling walker (2 wheeled) Sit to Stand: +2 physical assistance;From elevated surface;Mod assist Stand pivot transfers: +2 physical assistance;Total assist General transfer comment: multi-modal cues and hand over hand cues for safe technique; requies increase time; cues for full extension upon  standing, after stand pivot to [facility] (mod assist) pt assisted upright and began transfer over to recliner however due to fatigue required increased physical assist (total assist) so recliner pulled behind pt for safety    Exercises     PT Diagnosis:    PT Problem List:   PT Treatment Interventions:     PT Goals (current goals can now be found in the care plan section)    Visit Information  Last PT Received On: 11/18/13 History of Present Illness: 80 y.o. BIBEMS to [facility] in respiratory distress, hypoxic, reqired ventilator, extubated 1/14. Pt has recent extension of lacunar infarct.    Subjective Data      Cognition  Cognition Arousal/Alertness: Awake/alert Behavior During Therapy: WFL for tasks assessed/performed Overall Cognitive Status: No family/caregiver present to determine baseline cognitive functioning (slow processing at times)    Balance     End of Session PT - End of Session Equipment Utilized During Treatment: Gait belt;Oxygen Activity Tolerance: Patient limited by fatigue Patient left: in chair;with call bell/phone within reach;with family/visitor present (family/visitor entered room end of session) Nurse Communication: Mobility status;Need for lift equipment   GP     Aayana Reinertsen,KATHrine E 11/18/2013, 3:51 PM Zenovia Jarred, PT, DPT 11/18/2013 Pager: [phone]

## 2013-11-18 NOTE — Progress Notes (Signed)
SLP Cancellation Note  Patient Details Name: [name] MRN: [id number] DOB: 1919/05/01   Cancelled treatment:       Nsg. Currently working with pt., and RT for breathing treatment.  PT also waiting to see pt.  SLP will return if schedule allows.   Maryjo Rochester T 11/18/2013, 2:39 PM

## 2013-11-18 NOTE — Progress Notes (Signed)
Patient ID: [name], male   DOB: 1919/06/05, 80 y.o.   MRN: [id number] S:No new complaints O:BP 133/76  Pulse 65  Temp(Src) 97.3 F (36.3 C) (Oral)  Resp 18  Ht 6' (1.829 m)  Wt 88.7 kg (195 lb 8.8 oz)  BMI 26.52 kg/m2  SpO2 98%  Intake/Output Summary (Last 24 hours) at 11/18/13 0850 Last data filed at 11/18/13 0554  Gross per 24 hour  Intake    670 ml  Output   1225 ml  Net   -555 ml   Intake/Output: I/O last 3 completed shifts: In: 930 [P.O.:930] Out: 1550 [Urine:1550]  Intake/Output this shift:    Weight change: -0.1 kg (-3.5 oz) MWN:UUVOZ, elderly WM in NAD CVS:no rub Resp:scattered rhonchi and decreased BS at bases DGU:YQIHK Ext:+ pedal edema   Recent Labs Lab 11/12/13 0617 11/13/13 0505 11/14/13 0449 11/15/13 0509 11/16/13 0510 11/17/13 0444 11/18/13 0445  NA 139 137 136* 136* 135* 135* 134*  K 4.3 4.4 4.6 4.3 4.2 4.4 4.7  CL 99 98 98 99 98 97 96  CO2 22 22 20 20 21 21 21   GLUCOSE 114* 132* 128* 120* 106* 118* 117*  BUN 76* 84* 84* 92* 94* 102* 105*  CREATININE 3.57* 3.96* 4.08* 4.35* 4.38* 4.23* 4.22*  CALCIUM 8.5 8.5 8.7 8.8 8.7 8.7 8.6   Liver Function Tests: No results found for this basename: AST, ALT, ALKPHOS, BILITOT, PROT, ALBUMIN,  in the last 168 hours No results found for this basename: LIPASE, AMYLASE,  in the last 168 hours No results found for this basename: AMMONIA,  in the last 168 hours CBC:  Recent Labs Lab 11/12/13 0617 11/13/13 0505 11/14/13 0449 11/15/13 0509 11/18/13 0705  WBC 8.8 8.0 8.3 9.2 9.0  HGB 9.3* 8.6* 8.8* 8.8* 7.9*  HCT 28.4* 27.3* 27.4* 26.6* 24.4*  MCV 92.5 94.1 93.2 91.7 91.7  PLT 230 230 237 286 311   Cardiac Enzymes: No results found for this basename: CKTOTAL, CKMB, CKMBINDEX, TROPONINI,  in the last 168 hours CBG:  Recent Labs Lab 11/17/13 0752 11/17/13 1126 11/17/13 1648 11/17/13 2052 11/18/13 0717  GLUCAP 111* 124* 114* 160* 122*    Iron Studies: No results found for this  basename: IRON, TIBC, TRANSFERRIN, FERRITIN,  in the last 72 hours Studies/Results: No results found. Marland Kitchen antiseptic oral rinse  15 mL Mouth Rinse q12n4p  . atorvastatin  20 mg Oral q1800  . chlorhexidine  15 mL Mouth Rinse BID  . diltiazem  180 mg Oral Daily  . dipyridamole-aspirin  1 capsule Oral BID  . doxazosin  1 mg Oral QHS  . feeding supplement (RESOURCE BREEZE)  1 Container Oral q1800  . heparin  5,000 Units Subcutaneous Q8H  . hydrALAZINE  25 mg Oral Q8H  . insulin aspart  0-5 Units Subcutaneous QHS  . insulin aspart  0-9 Units Subcutaneous TID WC  . metoprolol  50 mg Oral BID  . pantoprazole  40 mg Oral Daily  . sodium chloride  3 mL Intravenous Q12H  . torsemide  20 mg Oral Daily    BMET    Component Value Date/Time   NA 134* 11/18/2013 0445   K 4.7 11/18/2013 0445   CL 96 11/18/2013 0445   CO2 21 11/18/2013 0445   GLUCOSE 117* 11/18/2013 0445   BUN 105* 11/18/2013 0445   CREATININE 4.22* 11/18/2013 0445   CALCIUM 8.6 11/18/2013 0445   GFRNONAA 11* 11/18/2013 0445   GFRAA 13* 11/18/2013 0445   CBC  Component Value Date/Time   WBC 9.0 11/18/2013 0705   RBC 2.66* 11/18/2013 0705   HGB 7.9* 11/18/2013 0705   HCT 24.4* 11/18/2013 0705   PLT 311 11/18/2013 0705   MCV 91.7 11/18/2013 0705   MCH 29.7 11/18/2013 0705   MCHC 32.4 11/18/2013 0705   RDW 14.3 11/18/2013 0705   LYMPHSABS 2.7 11/07/2013 0444   MONOABS 1.5* 11/07/2013 0444   EOSABS 0.1 11/07/2013 0444   BASOSABS 0.0 11/07/2013 0444     Assessment/Plan:  1. AKI/CKD stage 4: cardiorenal syndrome vs. ATN, Scr with some improvement but not significantly different overall GFR.  1. bladder scan revealed only 54cc. Renal US without hydro. 2. Scr has started to decline but overall, no sig change from yesterday 3. Agree with conservative care and he is not a candidate for HD.  4. Agree with demadex for SOB and agree with palliative care/hospice eval given his overall decline and poor prognosis. 5. Cont to follow UOP and daily  Scr and renal dose meds  2. ABLA- follow h/h which appears to have stabilized.  May benefit from blood transfusion (followed by Lasix 40mg  IV if primary team feels this would be helpful) 3. Hyponatremia- related to CHF and AKI/CKD. Cont to follow however if it continues to drop with diuresis, may becoming intravascular volume depleted. 4. Cardiorenal syndrome/CHF- currently on torsemide and is finally negative with I's/O's  1. Cont to follow daily weights and I's/O's  2. overall breathing better per his report 5. CAD s/p NSTEMI- Cards with nothing more to add 6. Iron deficiency- may or may not benefit from IV feraheme 1020mg , will d/w primary team. 7. COPD 8. Aspiration PNA- completed abx 9. CVA- with left hemiparesis 10. A fib, rate controlled 11. DM- per primary svc 12. Dispo- pending progression but may require SNF vs. Hospice (especially if his Scr continues to climb) 13. Will not see pt over the weekend.  Please call with questions or concerns, will follow peripherally as we have nothing more to offer pt.  [name] A

## 2013-11-18 NOTE — Progress Notes (Signed)
Speech Language Pathology Treatment: Dysphagia  Patient Details Name: [name] MRN: [id number] DOB: [birth year] Today's Date: 11/18/2013 Time: 7076-1518 SLP Time Calculation (min): 23 min  Assessment / Plan / Recommendation Clinical Impression  Patient now advanced to a Regular consistency diet, with renal restrictions and thin liquids.  Pt. Appears to be tolerating diet without s/s of aspiration, and no current esophageal issues.  Family at b/s, s/p Palliative Care mtg.  MD note pending, and wife states "No definite decisions have been made."  Wife c/o meat being to tough for pt. To chew (and for her to cut) and reports pt. Does not like green beans.  Phoned Nutritionist for assist re: food preferences and what is allowed on renal diet.  Nutri. To f/u with family.     HPI HPI: 80 yo adm to [facility] with respiratory difficulties.  PMH + for COPD, CVA, dysphagia.  Pt with ? aspiration of water prior to admission- respiratory failure with pt required intubation from 11/07/13-11/09/13.  Swallow evaluation completed last week with recommendation for esophagram.  Esophagram showed presbyesophagus and pt was started on regular/thin diet.  SLP to see pt to assess tolerance and for pt/family education.   No family present at this time.    Pertinent Vitals Pt. Is afebrile; scattered rhonchi (has COPD)  SLP Plan  Discharge SLP treatment due to (comment);All goals met    Recommendations Diet recommendations: Regular;Thin liquid Liquids provided via: Cup;Straw Medication Administration:  (as tolerated) Supervision: Full supervision/cueing for compensatory strategies Compensations: Slow rate;Small sips/bites;Multiple dry swallows after each bite/sip Postural Changes and/or Swallow Maneuvers: Seated upright 90 degrees;Upright 30-60 min after meal              Oral Care Recommendations: Oral care Q4 per protocol Follow up Recommendations: Skilled Nursing facility Plan: Discharge SLP treatment due to  (comment);All goals met    GO     Quinn Axe T 11/18/2013, 3:59 PM

## 2013-11-18 NOTE — Consult Note (Signed)
Patient [name]      DOB: 1918-12-13      VVY:[id number]  Summary of Goals of Care; full note to follow:  Met with Patient and his wife June, and daughters Lelan Pons and Cornelia Copa   Patient admits he is very weak.  He asks me what he can do to get better. This question is consistent with what his family shared which is generally he likes to try to improve his situation through rehab.  Family has clear picture of limitation of his current illness including worsening renal function with no offer for dialysis, cOPD, CVA, etc.  June states at this time her hope would be that he could improve som and go to the [facility] for rehab and if things got worse consider hospice care.  [facility] echoed his desire to use the [facility] for rehab.  Overall, the family knows that [facility] is eligible for hospice care  And we talked some about Residential Hospice facilities in their area.  Recommendations 1.  DNR affirmed  2.  Continue treatment for CHF as able  3.  Dysphagia : agree with SL diet choices monitor  4.  Continue to assess ability to participate in PT  5.  Continue antiplatelets for history of stroke  6.  Continue antibiotics and treatment of chronic issues.  Total time: 200 pm-330 pm  Nyree Yonker L. Lovena Le, MD MBA The Palliative Medicine Team at [facility] Phone: [phone] Pager: [phone]

## 2013-11-18 NOTE — Progress Notes (Signed)
[facility] PROGRESS NOTE  [name] MEQ:[id number] DOB: [birth year] DOA: 11/07/2013 PCP: Darius Ribas, MD  TRANSFER from [facility] 1/16 89/M with multiple med issues namely CAD, CHF, recent CVA, CKD 4, Afib, admitted on 1/12 to [facility], with ACute respiratory failure, due to pneumonia/NSTEMI requiring intubation. He is being followed by Cardiology and managed medically for NSTEMI due to CKD 4. He was extubated and transferred to [facility]. Unfortunately through this hospitalization, his kidney function has being declining, renal following now, he is being diuresed, but not a dialysis candidate and may need Palliative involvement   Assessment/Plan: 1. Acute Hypoxic resp failure -due to aspiration pneumonia and CHF/NSTEMI -s/p VDRF -extubated 1/14 -O2 weaned -volume overloaded, weight trending down today,  continue Demadex 20 daily as recommended -Continue when necessary nebs 2. NSTEMI/Pulm edema/cardio-renal syndrome -volume overloaded, lasix IV today again, continue demadex per Cards -Renal following -on med management only for NSTEMI due to advanced age/CKD -EF 55-60% -continue aggrenox/BB/statin  3. Aspiration pneumonia -completed 7 days of abx   4.  ARF on CKD 4 -suspect ATN/cardiorenal syndrome -urine output fair -he was being gently hydrated for 2 days, without improvement in creatinine -still volume overloaded, continue demadex, Iv lasix today again -he is not a dialysis candidate ,Dr [name] called and explained this to wife on 1/19. -Creatinine today 4.22 (from 4.23 on 1/22) -I discussed with Renal on 1/22 and as above renal recommends palliative consultation fo goals-discussed with patient and I have consulted palliative -no significant change in creatinine,  -Await palliative goals of care this afternoon  5. Afib, HR improved -continue diltiazem and metoprolol -on aggrenox started per Neuro last month after CVA -per cardiology patient will need long-term  treatment if aggressive treatments desired. -Discussed with rena 1/22l and Dr.Colodonato recommends as above palliative consultation, so for now holding off for anticoagulation pending palliative goals of care  6. DM -stable, SSI  7. H/o CVA -aggrenox, Fu with NEuro  DVT proph: hep SQ  Ambulate, OOB to chair Pt eval pending  Code Status: DNR Family Communication: wife and children at bedside  Disposition Plan: depending on clinical course   Consultants:  cards  HPI/Subjective: Sitting up in chair, denies any new complaints. Family at bedside.  Objective: Filed Vitals:   11/18/13 1332  BP: 118/69  Pulse: 58  Temp: 97.8 F (36.6 C)  Resp: 20    Intake/Output Summary (Last 24 hours) at 11/18/13 1409 Last data filed at 11/18/13 1300  Gross per 24 hour  Intake    590 ml  Output   1675 ml  Net  -1085 ml   Filed Weights   11/16/13 0544 11/17/13 0512 11/18/13 0540  Weight: 89.6 kg (197 lb 8.5 oz) 88.8 kg (195 lb 12.3 oz) 88.7 kg (195 lb 8.8 oz)    Exam:   General: alert, oriented to self, place, in NAD  Cardiovascular: S1S2/RRR  Respiratory: diminished at bases, but clear otherwise, no wheezes  Abdomen: soft, Nt, BS present  Musculoskeletal: trace edema c/c  Data Reviewed: Basic Metabolic Panel:  Recent Labs Lab 11/14/13 0449 11/15/13 0509 11/16/13 0510 11/17/13 0444 11/18/13 0445  NA 136* 136* 135* 135* 134*  K 4.6 4.3 4.2 4.4 4.7  CL 98 99 98 97 96  CO2 20 20 21 21 21   GLUCOSE 128* 120* 106* 118* 117*  BUN 84* 92* 94* 102* 105*  CREATININE 4.08* 4.35* 4.38* 4.23* 4.22*  CALCIUM 8.7 8.8 8.7 8.7 8.6   Liver Function Tests: No results found  for this basename: AST, ALT, ALKPHOS, BILITOT, PROT, ALBUMIN,  in the last 168 hours No results found for this basename: LIPASE, AMYLASE,  in the last 168 hours No results found for this basename: AMMONIA,  in the last 168 hours CBC:  Recent Labs Lab 11/12/13 0617 11/13/13 0505 11/14/13 0449  11/15/13 0509 11/18/13 0705  WBC 8.8 8.0 8.3 9.2 9.0  HGB 9.3* 8.6* 8.8* 8.8* 7.9*  HCT 28.4* 27.3* 27.4* 26.6* 24.4*  MCV 92.5 94.1 93.2 91.7 91.7  PLT 230 230 237 286 311   Cardiac Enzymes: No results found for this basename: CKTOTAL, CKMB, CKMBINDEX, TROPONINI,  in the last 168 hours BNP (last 3 results)  Recent Labs  10/05/13 1344 11/07/13 0444  PROBNP 3785.0* 8303.0*   CBG:  Recent Labs Lab 11/17/13 1126 11/17/13 1648 11/17/13 2052 11/18/13 0717 11/18/13 1142  GLUCAP 124* 114* 160* 122* 131*    Recent Results (from the past 240 hour(s))  CLOSTRIDIUM DIFFICILE BY PCR     Status: None   Collection Time    11/10/13  9:35 AM      Result Value Range Status   C difficile by pcr NEGATIVE  NEGATIVE Final   Comment: Performed at [facility]     Studies: No results found.  Scheduled Meds: . antiseptic oral rinse  15 mL Mouth Rinse q12n4p  . atorvastatin  20 mg Oral q1800  . chlorhexidine  15 mL Mouth Rinse BID  . diltiazem  180 mg Oral Daily  . dipyridamole-aspirin  1 capsule Oral BID  . doxazosin  1 mg Oral QHS  . feeding supplement (RESOURCE BREEZE)  1 Container Oral q1800  . heparin  5,000 Units Subcutaneous Q8H  . hydrALAZINE  25 mg Oral Q8H  . insulin aspart  0-5 Units Subcutaneous QHS  . insulin aspart  0-9 Units Subcutaneous TID WC  . metoprolol  50 mg Oral BID  . pantoprazole  40 mg Oral Daily  . sodium chloride  3 mL Intravenous Q12H  . torsemide  20 mg Oral Daily   Continuous Infusions:    Active Problems:   CVA (cerebral infarction)   HTN (hypertension)   CKD (chronic kidney disease), stage III   CAD (coronary artery disease)   AAA (abdominal aortic aneurysm)   Pacemaker - Medtronic adapta dual-chamber implanted 2010   Acute respiratory distress   CHF (congestive heart failure)   Acute respiratory failure   Palliative care status   Time spent: 25min   P[facility] Valley Surgicenter LLCVIYUOH,[name] C  [facility] Pager [phone]. If 7PM-7AM, please  contact night-coverage at [email or web address], password [email or web address] 11/18/2013, 2:09 PM  LOS: 11 days

## 2013-11-19 DIAGNOSIS — Z515 Encounter for palliative care: Secondary | ICD-10-CM

## 2013-11-19 DIAGNOSIS — D649 Anemia, unspecified: Secondary | ICD-10-CM

## 2013-11-19 LAB — PROTIME-INR
INR: 1.02 (ref 0.00–1.49)
PROTHROMBIN TIME: 13.2 s (ref 11.6–15.2)

## 2013-11-19 LAB — GLUCOSE, CAPILLARY
Glucose-Capillary: 115 mg/dL — ABNORMAL HIGH (ref 70–99)
Glucose-Capillary: 108 mg/dL — ABNORMAL HIGH (ref 70–99)
Glucose-Capillary: 113 mg/dL — ABNORMAL HIGH (ref 70–99)
Glucose-Capillary: 148 mg/dL — ABNORMAL HIGH (ref 70–99)

## 2013-11-19 LAB — BASIC METABOLIC PANEL
BUN: 109 mg/dL — ABNORMAL HIGH (ref 6–23)
CALCIUM: 8.8 mg/dL (ref 8.4–10.5)
CO2: 20 mEq/L (ref 19–32)
CREATININE: 4.02 mg/dL — AB (ref 0.50–1.35)
Chloride: 98 mEq/L (ref 96–112)
GFR calc Af Amer: 14 mL/min — ABNORMAL LOW (ref >90)
GFR calc non Af Amer: 12 mL/min — ABNORMAL LOW (ref >90)
Glucose, Bld: 122 mg/dL — ABNORMAL HIGH (ref 70–99)
Potassium: 4.4 mEq/L (ref 3.7–5.3)
Sodium: 135 mEq/L — ABNORMAL LOW (ref 137–147)

## 2013-11-19 LAB — PREPARE RBC (CROSSMATCH)

## 2013-11-19 LAB — ABO/RH: ABO/RH(D): AB POS

## 2013-11-19 MED ORDER — FUROSEMIDE 10 MG/ML IJ SOLN
40.0000 mg | Freq: Once | INTRAMUSCULAR | Status: AC
  Administered 2013-11-19: 40 mg via INTRAVENOUS
  Filled 2013-11-19: qty 4

## 2013-11-19 MED ORDER — WARFARIN SODIUM 2.5 MG PO TABS
2.5000 mg | ORAL_TABLET | Freq: Once | ORAL | Status: AC
  Administered 2013-11-19: 2.5 mg via ORAL
  Filled 2013-11-19: qty 1

## 2013-11-19 MED ORDER — WARFARIN VIDEO: Freq: Once | Status: DC

## 2013-11-19 MED ORDER — PATIENT'S GUIDE TO USING COUMADIN BOOK
Freq: Once | Status: AC
  Administered 2013-11-19: 13:00:00
  Filled 2013-11-19: qty 1

## 2013-11-19 MED ORDER — WARFARIN - PHARMACIST DOSING INPATIENT: Freq: Every day | Status: DC

## 2013-11-19 NOTE — Progress Notes (Signed)
Patient ID: Darius PallWilliam E Schaffer, male   DOB: 25-Jul-1919, 80 y.o.   MRN: [id number] S:no complaints O:BP 140/83  Pulse 73  Temp(Src) 98.9 F (37.2 C) (Oral)  Resp 18  Ht 6' (1.829 m)  Wt 88.8 kg (195 lb 12.3 oz)  BMI 26.55 kg/m2  SpO2 95%  Intake/Output Summary (Last 24 hours) at 11/19/13 0904 Last data filed at 11/19/13 0559  Gross per 24 hour  Intake    240 ml  Output   1225 ml  Net   -985 ml   Intake/Output: I/O last 3 completed shifts: In: 240 [P.O.:240] Out: 1800 [Urine:1800]  Intake/Output this shift:    Weight change: 0.1 kg (3.5 oz) WUJ:WJXBJGen:frail, fatigued elderly WM who is resting comfortably but sitting upright CVS:no rub Resp:decreased BS at bases Abd:+BS, soft Ext:tr edema   Recent Labs Lab 11/13/13 0505 11/14/13 0449 11/15/13 0509 11/16/13 0510 11/17/13 0444 11/18/13 0445 11/19/13 0537  NA 137 136* 136* 135* 135* 134* 135*  K 4.4 4.6 4.3 4.2 4.4 4.7 4.4  CL 98 98 99 98 97 96 98  CO2 22 20 20 21 21 21 20   GLUCOSE 132* 128* 120* 106* 118* 117* 122*  BUN 84* 84* 92* 94* 102* 105* 109*  CREATININE 3.96* 4.08* 4.35* 4.38* 4.23* 4.22* 4.02*  CALCIUM 8.5 8.7 8.8 8.7 8.7 8.6 8.8   Liver Function Tests: No results found for this basename: AST, ALT, ALKPHOS, BILITOT, PROT, ALBUMIN,  in the last 168 hours No results found for this basename: LIPASE, AMYLASE,  in the last 168 hours No results found for this basename: AMMONIA,  in the last 168 hours CBC:  Recent Labs Lab 11/13/13 0505 11/14/13 0449 11/15/13 0509 11/18/13 0705  WBC 8.0 8.3 9.2 9.0  HGB 8.6* 8.8* 8.8* 7.9*  HCT 27.3* 27.4* 26.6* 24.4*  MCV 94.1 93.2 91.7 91.7  PLT 230 237 286 311   Cardiac Enzymes: No results found for this basename: CKTOTAL, CKMB, CKMBINDEX, TROPONINI,  in the last 168 hours CBG:  Recent Labs Lab 11/18/13 0717 11/18/13 1142 11/18/13 1633 11/18/13 2121 11/19/13 0742  GLUCAP 122* 131* 116* 153* 115*    Iron Studies: No results found for this basename: IRON, TIBC,  TRANSFERRIN, FERRITIN,  in the last 72 hours Studies/Results: No results found. Marland Kitchen. antiseptic oral rinse  15 mL Mouth Rinse q12n4p  . atorvastatin  20 mg Oral q1800  . chlorhexidine  15 mL Mouth Rinse BID  . diltiazem  180 mg Oral Daily  . dipyridamole-aspirin  1 capsule Oral BID  . doxazosin  1 mg Oral QHS  . feeding supplement (RESOURCE BREEZE)  1 Container Oral q1800  . heparin  5,000 Units Subcutaneous Q8H  . hydrALAZINE  25 mg Oral Q8H  . insulin aspart  0-5 Units Subcutaneous QHS  . insulin aspart  0-9 Units Subcutaneous TID WC  . metoprolol  50 mg Oral BID  . pantoprazole  40 mg Oral Daily  . sodium chloride  3 mL Intravenous Q12H  . torsemide  20 mg Oral Daily    BMET    Component Value Date/Time   NA 135* 11/19/2013 0537   K 4.4 11/19/2013 0537   CL 98 11/19/2013 0537   CO2 20 11/19/2013 0537   GLUCOSE 122* 11/19/2013 0537   BUN 109* 11/19/2013 0537   CREATININE 4.02* 11/19/2013 0537   CALCIUM 8.8 11/19/2013 0537   GFRNONAA 12* 11/19/2013 0537   GFRAA 14* 11/19/2013 0537   CBC    Component Value  Date/Time   WBC 9.0 11/18/2013 0705   RBC 2.66* 11/18/2013 0705   HGB 7.9* 11/18/2013 0705   HCT 24.4* 11/18/2013 0705   PLT 311 11/18/2013 0705   MCV 91.7 11/18/2013 0705   MCH 29.7 11/18/2013 0705   MCHC 32.4 11/18/2013 0705   RDW 14.3 11/18/2013 0705   LYMPHSABS 2.7 11/07/2013 0444   MONOABS 1.5* 11/07/2013 0444   EOSABS 0.1 11/07/2013 0444   BASOSABS 0.0 11/07/2013 0444     Assessment/Plan:  1. AKI/CKD stage 4: cardiorenal syndrome vs. ATN, Scr with some improvement but not significantly different overall GFR.  1. Scr has started to decline but overall, no sig change from yesterday 2. Agree with conservative care and he is not a candidate for HD.  3. Agree with demadex for SOB and agree with palliative care/hospice eval given his overall decline and poor prognosis. 4. Cont to follow UOP and daily Scr and renal dose meds  2. ABLA- follow h/h which appears to have acutely dropped  over the last 24 hours 1. May benefit from blood transfusion (followed by Lasix 40mg  IV if primary team feels this would be helpful) 3. Hyponatremia- related to CHF and AKI/CKD. Cont to follow however if it continues to drop with diuresis, may becoming intravascular volume depleted. 4. Cardiorenal syndrome/CHF- currently on torsemide and is finally negative with I's/O's  1. Cont to follow daily weights and I's/O's  2. overall breathing better per his report 5. CAD s/p NSTEMI- Cards with nothing more to add 6. Iron deficiency- may or may not benefit from IV feraheme 1020mg , will d/w primary team. 7. COPD 8. Aspiration PNA- completed abx 9. CVA- with left hemiparesis 10. A fib, rate controlled 11. DM- per primary svc 12. Dispo- pending progression but may require SNF vs. Hospice (especially if his Scr continues to climb) 13. Will sign off, will follow peripherally as we have nothing more to offer pt. 14. Please call with questions or concerns   Debraann Livingstone A

## 2013-11-19 NOTE — Progress Notes (Addendum)
[facility] PROGRESS NOTE  [name] FSF:[id number] DOB: 01-Aug-1919 DOA: 11/07/2013 PCP: Colette Ribas, MD  TRANSFER from PCCM 1/16 89/M with multiple med issues namely CAD, CHF, recent CVA, CKD 4, Afib, admitted on 1/12 to PCCM, with ACute respiratory failure, due to pneumonia/NSTEMI requiring intubation. He is being followed by Cardiology and managed medically for NSTEMI due to CKD 4. He was extubated and transferred to [facility]. Unfortunately through this hospitalization, his kidney function has being declining, renal following now, he is being diuresed, but not a dialysis candidate and may need Palliative involvement   Assessment/Plan: 1. Acute Hypoxic resp failure -due to aspiration pneumonia and CHF/NSTEMI -s/p VDRF -extubated 1/14 -O2 weaned -volume overloaded, weight trending down today,  continue Demadex 20 daily as recommended -Continue when necessary nebs 2. NSTEMI/Pulm edema/cardio-renal syndrome -volume overloaded, lasix IV today again, continue demadex per Cards -Renal following -on med management only for NSTEMI due to advanced age/CKD -EF 55-60% -continue BB/statin, will DC Aggrenox and place patient on Coumadin>> as patient's/family's desire is to treat all that can be treated to get him better. They are in agreement with placing on coumadin  3. Aspiration pneumonia -completed 7 days of abx   4.  ARF on CKD 4 -suspect ATN/cardiorenal syndrome -urine output fair -he was being gently hydrated for 2 days, without improvement in creatinine -still volume overloaded, continue demadex, Iv lasix today again -he is not a dialysis candidate ,Dr Jomarie Longs called and explained this to wife on 1/19. -Creatinine today 4.02 (from 4.22 on 1/23) -I discussed with Renal on 1/22 and as above renal recommends palliative consultation fo goals-discussed with patient and I have consulted palliative -Creatinine trending down, follow.  5. Afib, HR improved -continue diltiazem  and metoprolol -has been on aggrenox started per Neuro last month after CVA but per cardiology patient will need long-term treatment if aggressive treatments desired. -As discussed above family with like to continue to treat all that can be treated to get him better. They are in agreement with placing on coumadin, will DC Aggrenox as above. Avoid NOAC in this patient>65 with very poor renal function  6. DM -stable, SSI  7. H/o CVA -DC aggrenox as above and place on Coumadin, Fu with NEuro 8.Anemia -likely chronic disease associated with his CKD -Will transfuse a unit of PRBCs, give Lasix as transfusion and follow -Appreciate renal assistance  DVT proph: hep SQ     Code Status: DNR Family Communication: wife by phone Disposition Plan: depending on clinical course   Consultants:  cards  HPI/Subjective: states breathing about the same, still feels weak  Objective: Filed Vitals:   11/19/13 0556  BP: 140/83  Pulse: 73  Temp: 98.9 F (37.2 C)  Resp: 18    Intake/Output Summary (Last 24 hours) at 11/19/13 1119 Last data filed at 11/19/13 0559  Gross per 24 hour  Intake    240 ml  Output   1225 ml  Net   -985 ml   Filed Weights   11/17/13 0512 11/18/13 0540 11/19/13 0556  Weight: 88.8 kg (195 lb 12.3 oz) 88.7 kg (195 lb 8.8 oz) 88.8 kg (195 lb 12.3 oz)    Exam:   General: alert, oriented to self, place, in NAD  Cardiovascular: S1S2/RRR  Respiratory: diminished at bases, but clear otherwise, no wheezes  Abdomen: soft, Nt, BS present  Musculoskeletal: No edema, no cyanosis  Data Reviewed: Basic Metabolic Panel:  Recent Labs Lab 11/15/13 0509 11/16/13 0510 11/17/13 0444 11/18/13 0445 11/19/13  0537  NA 136* 135* 135* 134* 135*  K 4.3 4.2 4.4 4.7 4.4  CL 99 98 97 96 98  CO2 20 21 21 21 20   GLUCOSE 120* 106* 118* 117* 122*  BUN 92* 94* 102* 105* 109*  CREATININE 4.35* 4.38* 4.23* 4.22* 4.02*  CALCIUM 8.8 8.7 8.7 8.6 8.8   Liver Function  Tests: No results found for this basename: AST, ALT, ALKPHOS, BILITOT, PROT, ALBUMIN,  in the last 168 hours No results found for this basename: LIPASE, AMYLASE,  in the last 168 hours No results found for this basename: AMMONIA,  in the last 168 hours CBC:  Recent Labs Lab 11/13/13 0505 11/14/13 0449 11/15/13 0509 11/18/13 0705  WBC 8.0 8.3 9.2 9.0  HGB 8.6* 8.8* 8.8* 7.9*  HCT 27.3* 27.4* 26.6* 24.4*  MCV 94.1 93.2 91.7 91.7  PLT 230 237 286 311   Cardiac Enzymes: No results found for this basename: CKTOTAL, CKMB, CKMBINDEX, TROPONINI,  in the last 168 hours BNP (last 3 results)  Recent Labs  10/05/13 1344 11/07/13 0444  PROBNP 3785.0* 8303.0*   CBG:  Recent Labs Lab 11/18/13 0717 11/18/13 1142 11/18/13 1633 11/18/13 2121 11/19/13 0742  GLUCAP 122* 131* 116* 153* 115*    Recent Results (from the past 240 hour(s))  CLOSTRIDIUM DIFFICILE BY PCR     Status: None   Collection Time    11/10/13  9:35 AM      Result Value Range Status   C difficile by pcr NEGATIVE  NEGATIVE Final   Comment: [facility]     Studies: No results found.  Scheduled Meds: . antiseptic oral rinse  15 mL Mouth Rinse q12n4p  . atorvastatin  20 mg Oral q1800  . chlorhexidine  15 mL Mouth Rinse BID  . diltiazem  180 mg Oral Daily  . dipyridamole-aspirin  1 capsule Oral BID  . doxazosin  1 mg Oral QHS  . feeding supplement (RESOURCE BREEZE)  1 Container Oral q1800  . furosemide  40 mg Intravenous Once  . heparin  5,000 Units Subcutaneous Q8H  . hydrALAZINE  25 mg Oral Q8H  . insulin aspart  0-5 Units Subcutaneous QHS  . insulin aspart  0-9 Units Subcutaneous TID WC  . metoprolol  50 mg Oral BID  . pantoprazole  40 mg Oral Daily  . sodium chloride  3 mL Intravenous Q12H  . torsemide  20 mg Oral Daily   Continuous Infusions:    Active Problems:   CVA (cerebral infarction)   HTN (hypertension)   CKD (chronic kidney disease), stage III   CAD (coronary  artery disease)   AAA (abdominal aortic aneurysm)   Pacemaker - Medtronic adapta dual-chamber implanted 2010   Acute respiratory distress   CHF (congestive heart failure)   Acute respiratory failure   Palliative care status   Time spent: 35min   [facility] Secure Medical FacilityVIYUOH,Sary Bogie C  [facility] Pager [phone]. If 7PM-7AM, please contact night-coverage at [email or web address], password [email or web address] 11/19/2013, 11:19 AM  LOS: 12 days

## 2013-11-19 NOTE — Progress Notes (Signed)
ANTICOAGULATION CONSULT NOTE - Initial Consult  Pharmacy Consult for Warfarin Indication: atrial fibrillation  Allergies  Allergen Reactions  . Lasix [Furosemide] Itching    Scalp broke out  . Bystolic [Nebivolol Hcl] Rash  . Plavix [Clopidogrel Bisulfate] Rash    Patient Measurements: Height: 6' (182.9 cm) Weight: 195 lb 12.3 oz (88.8 kg) IBW/kg (Calculated) : 77.6  Vital Signs: Temp: 98.9 F (37.2 C) (01/24 0556) Temp src: Oral (01/24 0556) BP: 140/83 mmHg (01/24 0556) Pulse Rate: 73 (01/24 0556)  Labs:  Recent Labs  11/17/13 0444 11/18/13 0445 11/18/13 0705 11/19/13 0537 11/19/13 1213  HGB  --   --  7.9*  --   --   HCT  --   --  24.4*  --   --   PLT  --   --  311  --   --   LABPROT  --   --   --   --  13.2  INR  --   --   --   --  1.02  CREATININE 4.23* 4.22*  --  4.02*  --     Estimated Creatinine Clearance: 12.9 ml/min (by C-G formula based on Cr of 4.02).   Medical History: Past Medical History  Diagnosis Date  . Coronary artery disease   . Hypertension   . Pacemaker 05/08/2009    Medtronic  . CKD (chronic kidney disease), stage III   . Complete heart block   . GERD (gastroesophageal reflux disease)   . Diastolic heart failure   . Myocardial infarction 2010  . Stroke 1998, 2005, 2009  . Shingles   . CHF (congestive heart failure)     diastolic  . COPD (chronic obstructive pulmonary disease)   . Arthritis   . Left-sided weakness   . Peripheral edema   . AAA (abdominal aortic aneurysm)     infrarenal 03/12/12- 3.7 x 3.5 cm    Medications:  Scheduled:  . antiseptic oral rinse  15 mL Mouth Rinse q12n4p  . atorvastatin  20 mg Oral q1800  . chlorhexidine  15 mL Mouth Rinse BID  . diltiazem  180 mg Oral Daily  . doxazosin  1 mg Oral QHS  . feeding supplement (RESOURCE BREEZE)  1 Container Oral q1800  . furosemide  40 mg Intravenous Once  . heparin  5,000 Units Subcutaneous Q8H  . hydrALAZINE  25 mg Oral Q8H  . insulin aspart  0-5 Units  Subcutaneous QHS  . insulin aspart  0-9 Units Subcutaneous TID WC  . metoprolol  50 mg Oral BID  . pantoprazole  40 mg Oral Daily  . sodium chloride  3 mL Intravenous Q12H  . torsemide  20 mg Oral Daily   Infusions:    Assessment: 80 yo male with multiple medical problems, admitted 1/12 to CCM for intubation from PNA/NSTEMI. He was taking Aggrenox PTA s/p CVA, but now converting to warfarin due to afib.  Baseline PT/INR normal  Hgb decreased 7.9 (1/23), no visible bleeding reported  Currently on heparin 5k units sq q8h until INR therapeutic  No drug interactions noted  Goal of Therapy:  INR 2-3 Monitor platelets by anticoagulation protocol: Yes   Plan:   Warfarin 2.5mg  today - low dose due to age > 80 yr  Daily PT/INR  Provide warfarin education prior to discharge  Loralee Pacas, PharmD, BCPS Pager: [phone] 11/19/2013,12:35 PM

## 2013-11-19 NOTE — Progress Notes (Signed)
Patient BT:DHRCBUL RYN SKUBAL      DOB: [birth year]      [id number]   Palliative Medicine Team at [facility] Progress Note    Subjective: No family at bedside. Patient awake and alert receiving a bath. Speech is clear. Respirations remained intermittently short of breath  Filed Vitals:   11/19/13 0556  BP: 140/83  Pulse: 73  Temp: 98.9 F (37.2 C)  Resp: 18   Physical exam: Gen. mild shortness of breath with exertion Pupils equal reactive to light speech is slow but clear Chest decreased with coarse bilateral basilar rhonchi Cardiovascular regular rate and rhythm positive S1 and S2 no S3-S4 didn't appreciate a murmur or gallop Abdomen obese soft nontender nondistended Extremities 2+ edema Neurologically awake alert oriented to self speech is slow which is at baseline   INR 1.02  Creatinine 4.2        Assessment and plan: 80 year-old white male admitted to the hospital with increased shortness of breath found to have right lower lobe infiltrate. Patient ultimately required intubation. He was extubated several days ago it exited quite well. At this time the goals of care the family are to prove his current situation despite the difficulties with worsening renal function and attempt to transition for rehabilitation to the [facility].   1 DO NOT RESUSCITATE 2. Anemia with generalized weakness and shortness of breath I agree with transfusion as it is part of the patient's and family's goals of care at this time to treat the treatable  3. CK D. stage IV 5 continue diuresis as necessary   Total time 15 minutes Eddie Koc L. Ladona Ridgel, MD MBA The Palliative Medicine Team at [facility] Phone: [phone] Pager: [phone]'

## 2013-11-20 DIAGNOSIS — K120 Recurrent oral aphthae: Secondary | ICD-10-CM

## 2013-11-20 LAB — TYPE AND SCREEN
ABO/RH(D): AB POS
Antibody Screen: NEGATIVE
Unit division: 0

## 2013-11-20 LAB — BASIC METABOLIC PANEL
BUN: 118 mg/dL — ABNORMAL HIGH (ref 6–23)
CALCIUM: 8.6 mg/dL (ref 8.4–10.5)
CO2: 21 mEq/L (ref 19–32)
CREATININE: 4.09 mg/dL — AB (ref 0.50–1.35)
Chloride: 100 mEq/L (ref 96–112)
GFR calc Af Amer: 13 mL/min — ABNORMAL LOW (ref >90)
GFR, EST NON AFRICAN AMERICAN: 11 mL/min — AB (ref >90)
GLUCOSE: 110 mg/dL — AB (ref 70–99)
Potassium: 4.3 mEq/L (ref 3.7–5.3)
SODIUM: 137 meq/L (ref 137–147)

## 2013-11-20 LAB — CBC
HEMATOCRIT: 25.4 % — AB (ref 39.0–52.0)
Hemoglobin: 8.3 g/dL — ABNORMAL LOW (ref 13.0–17.0)
MCH: 29.9 pg (ref 26.0–34.0)
MCHC: 32.7 g/dL (ref 30.0–36.0)
MCV: 91.4 fL (ref 78.0–100.0)
Platelets: 359 10*3/uL (ref 150–400)
RBC: 2.78 MIL/uL — ABNORMAL LOW (ref 4.22–5.81)
RDW: 14.8 % (ref 11.5–15.5)
WBC: 8.1 10*3/uL (ref 4.0–10.5)

## 2013-11-20 LAB — PROTIME-INR
INR: 1.16 (ref 0.00–1.49)
Prothrombin Time: 14.6 seconds (ref 11.6–15.2)

## 2013-11-20 LAB — GLUCOSE, CAPILLARY
GLUCOSE-CAPILLARY: 108 mg/dL — AB (ref 70–99)
Glucose-Capillary: 105 mg/dL — ABNORMAL HIGH (ref 70–99)
Glucose-Capillary: 104 mg/dL — ABNORMAL HIGH (ref 70–99)
Glucose-Capillary: 126 mg/dL — ABNORMAL HIGH (ref 70–99)

## 2013-11-20 MED ORDER — WARFARIN SODIUM 2.5 MG PO TABS
2.5000 mg | ORAL_TABLET | Freq: Once | ORAL | Status: AC
  Administered 2013-11-20: 2.5 mg via ORAL
  Filled 2013-11-20: qty 1

## 2013-11-20 MED ORDER — TRIAMCINOLONE ACETONIDE 0.1 % MT PSTE
PASTE | Freq: Two times a day (BID) | OROMUCOSAL | Status: DC
  Administered 2013-11-20 – 2013-11-22 (×4): via OROMUCOSAL
  Filled 2013-11-20: qty 5

## 2013-11-20 MED ORDER — NON FORMULARY: Freq: Three times a day (TID) | Status: DC | PRN

## 2013-11-20 MED ORDER — POLYVINYL ALCOHOL 1.4 % OP SOLN
1.0000 [drp] | Freq: Three times a day (TID) | OPHTHALMIC | Status: DC | PRN
  Administered 2013-11-20: 1 [drp] via OPHTHALMIC
  Filled 2013-11-20: qty 15

## 2013-11-20 MED ORDER — NYSTATIN 100000 UNIT/ML MT SUSP
5.0000 mL | Freq: Four times a day (QID) | OROMUCOSAL | Status: DC
  Administered 2013-11-20 – 2013-11-22 (×7): 500000 [IU] via ORAL
  Filled 2013-11-20 (×10): qty 5

## 2013-11-20 NOTE — Progress Notes (Addendum)
Patient [name]      DOB: 1919-07-06      KZS:[id number]   Palliative Medicine Team at [facility] Progress Note    Subjective:  Patient is awake and alert. He complains of discomfort on the left side of this time will especially when eating. Is also having discomfort on his right buttocks when sitting. Overall he states that he slept better and is breathing better. His granddaughter is assisting him with eating some peaches.   Filed Vitals:   11/20/13 1248  BP: 124/80  Pulse: 64  Temp: 97.9 F (36.6 C)  Resp: 20   Physical exam: Gen. no acute distress other than having discomfort with eating on his time Pupils are equal round and reactive to light extraocular muscles appear to be intact mucous membranes are moist. He has some small aphthous ulcers on the lateral aspect of the right tongue Chest decreased with some coarse crackles bilaterally anteriorly Cardiovascular regular rate rhythm positive S1 and S2 no S3-S4 Abdomen obese soft nontender nondistended Extremities edema 1+ Neurologically: Patient appears to be intact his speech is slow at baseline    Lab Results  Component Value Date   CREATININE  4.09  11/20/2013   BUN  118  11/20/2013   NA 13 7  11/20/2013   K 4.3 11/20/2013   CL 100 11/20/2013   CO2  21  11/20/2013   . White count 8.1 hemoglobin 8.3 hematocrit 25.4 platelets of 359  INR 1.16   Assessment and plan: 80 year old white male with advanced chronic kidney disease, history of a pacemaker and coronary artery disease with diastolic heart failure. Recently had a right CVA with extension. Patient was admitted in respiratory failure requiring intubation. He was extubated last week and was not expected to do well but has continued to slightly improve each day. Family has been advised that he is in a difficult situation where it's been hard to take fluid off because his kidneys become worse when it happens. Today he is slightly improved he is complaining of  some pain in his left lateral.   1. DO NOT RESUSCITATE has been established but the patient is NOT comfort care.   2. Dyspnea present slight improvement. Continue to try to diuresis. Blood has been given. Continue aggressive pulmonary toilet and nebulizers 3. Anemia status post blood transfusion continue to monitor this patient will be on Coumadin and Aggrenox will be discontinued  4. Aspiration pneumonia: Completed antibiotic course  5. Acute renal failure with stage IV chronic kidney disease. IV Lasix was discontinued. Demadex in place continue to follow 6. H. fibrillation with pacemaker continue oral medications 7. History of recent stroke patient starting on Coumadin. Disposition family desires to go to the [facility] for rehabilitation.  palliative care services may be available to them there  Through [place].  15 minutes Valeen Borys L. Ladona Ridgel, MD MBA The Palliative Medicine Team at [facility] Phone: [phone] Pager: [phone]

## 2013-11-20 NOTE — Consult Note (Signed)
Patient [name]      DOB: 07-Sep-1919      POE:[id number]     Consult Note from the Palliative Medicine Team at [place] Requested by: Dr. Inis Sizer     PCP: Purvis Kilts, MD Reason for Consultation: Goals of care    Phone [phone] Related symptom recommendations Assessment of patients Current state: Patient is a 80 year old white male presented to the hospital from independent ED in respiratory distress. The patient required intubation as his chest x-ray showed right infiltrates and possible volume overload. Patient was found to have an elevation in his troponin consistent with myocardial infarction and likely aspiration pneumonia. Attempts to diurese him work met with increasing creatinine suggesting that we were getting into a difficult situation with attempting to cure him in the face of his chronic kidney disease which had been stage III. I met with the patient's second wife and biological daughters Verdis Frederickson and Cornelia Copa. Family is coming to grips with the understanding that his chronic medical illnesses that are life limiting and variable in their response to treatment. We delivered the message that his kidneys are continuing to fail but could improve but not to the point of being cured. They describe the up and down pattern which will likely continue throughout the rest of his life related to his heart disease and kidney disease. At this time the patient's wife insists that he should be given the opportunity to recover as she plans for him to be dependent Center for rehabilitation. She vacillates between understanding that he has severe chronic medical illnesses and an unrealistic view that he will recover and be able to rehabilitate. We did about the role of hospice and his future. At this time she does not desire to pursue hospice care. She would like to pursue continued curative care with disposition to the [facility] for rehabilitation. Please see my  note on the date of service.   Goals of Care: 1.  Code Status: DO NOT RESUSCITATE has been affirmed   2. Scope of Treatment: Continued curative treatments and supportive treatments including diuresis, antibiotics, medication for chronic illness, blood transfusion.  4. Disposition: Once medically stable the family would like to transition him to the pain Center for rehabilitation   3. Symptom Management:   1. Pain: When necessary fentanyl is in place continue to monitor 2. Bowel Regimen: Last BM 11/15/2013 continue to monitor. Patient currently on Senokot 3. Delirium: High-risk for delirium based on multiple medical illnesses but presently oriented 4. Aspiration continue modified diet and treatment of pneumonia  4. 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 He is active in the [employer] he is married for the second time to June and has 2 daughters  71. Spiritual: Patient is a man of Faith in spiritual care has been offered        Patient Documents Completed or Given: Document Given Completed  Advanced Directives Pkt    MOST    DNR    Gone from My Sight    Hard Choices      Brief HPI: 80 year old white male admitted with shortness of breath and respiratory failure requiring intubation. Is extubated on 114. It was determined that he likely had a mild heart attack in 2 aspiration pneumonia. Patient recently had a stroke and has been on Aggrenox . We were asked to assist with goals of care as he continues to be short of breath with worsening renal function  ROS: Limited by patient's inability to talk of breath mild pain in the right buttock area    PMH:  Past Medical History  Diagnosis Date  . Coronary artery disease   . Hypertension   . Pacemaker 05/08/2009    Medtronic  . CKD (chronic kidney disease), stage III   . Complete heart block   . GERD (gastroesophageal reflux disease)   . Diastolic heart failure   . Myocardial infarction 2010   . Stroke 1998, 2005, 2009  . Shingles   . CHF (congestive heart failure)     diastolic  . COPD (chronic obstructive pulmonary disease)   . Arthritis   . Left-sided weakness   . Peripheral edema   . AAA (abdominal aortic aneurysm)     infrarenal 03/12/12- 3.7 x 3.5 cm     PSH: Past Surgical History  Procedure Laterality Date  . Eye surgery      tear duct probing  . Permanent pacemaker insertion  05/08/2009    Medtronic  . Hernia repair  10 yrs ago    bilateral_[facility]  . Cataract extraction w/phaco  02/03/2012    Procedure: CATARACT EXTRACTION PHACO AND INTRAOCULAR LENS PLACEMENT (IOC);  Surgeon: Elta Guadeloupe T. Gershon Crane, MD;  Location: [facility];  Service: Ophthalmology;  Laterality: Left;  CDE=29.12  . Cataract extraction w/phaco  03/23/2012    Procedure: CATARACT EXTRACTION PHACO AND INTRAOCULAR LENS PLACEMENT (IOC);  Surgeon: Elta Guadeloupe T. Gershon Crane, MD;  Location: [facility];  Service: Ophthalmology;  Laterality: Right;  CDE 22.98  . Nm myocar perf wall motion  06/13/2009    mild to mod ischemia basal inferior & mid inferior regions   I have reviewed the FH and SH and  If appropriate update it with new information. Allergies  Allergen Reactions  . Lasix [Furosemide] Itching    Scalp broke out  . Bystolic [Nebivolol Hcl] Rash  . Plavix [Clopidogrel Bisulfate] Rash   Scheduled Meds: . antiseptic oral rinse  15 mL Mouth Rinse q12n4p  . atorvastatin  20 mg Oral q1800  . chlorhexidine  15 mL Mouth Rinse BID  . diltiazem  180 mg Oral Daily  . doxazosin  1 mg Oral QHS  . feeding supplement (RESOURCE BREEZE)  1 Container Oral q1800  . heparin  5,000 Units Subcutaneous Q8H  . hydrALAZINE  25 mg Oral Q8H  . insulin aspart  0-5 Units Subcutaneous QHS  . insulin aspart  0-9 Units Subcutaneous TID WC  . metoprolol  50 mg Oral BID  . pantoprazole  40 mg Oral Daily  . sodium chloride  3 mL Intravenous Q12H  . torsemide  20 mg Oral Daily  . warfarin   Does not apply Once  . Warfarin - Pharmacist  Dosing Inpatient   Does not apply q1800   Continuous Infusions:  PRN Meds:.acetaminophen, albuterol, fentaNYL, hydrALAZINE, senna    BP 132/80  Pulse 72  Temp(Src) 97.8 F (36.6 C) (Oral)  Resp 18  Ht 6' (1.829 m)  Wt 87.5 kg (192 lb 14.4 oz)  BMI 26.16 kg/m2  SpO2 99%   PPS: 30%   Intake/Output Summary (Last 24 hours) at 11/20/13 0846 Last data filed at 11/20/13 3235  Gross per 24 hour  Intake  337.5 ml  Output   1425 ml  Net -1087.5 ml   LBM: 11/15/2013  Physical Exam:  General: Mild distress secondary to shortness of breath can't complete sentences HEENT:  Pupils equal round and reactive to light extraocular muscles appear to be intact mucous  membranes are moist Chest:   Coarse bilateral rhonchi with some upper tracheal wheezing CVS: Regular rate and rhythm positive S1 and S2 I don't appreciate an S3 or S4 Abdomen: Obese soft nontender nondistended with positive bowel sounds Ext: One plus edema Neuro: Slow to speak but this is his baseline. Patient appears oriented and clear about wishes, asked what he can do to get better  Labs: CBC    Component Value Date/Time   WBC 8.1 11/20/2013 0515   RBC 2.78* 11/20/2013 0515   HGB 8.3* 11/20/2013 0515   HCT 25.4* 11/20/2013 0515   PLT 359 11/20/2013 0515   MCV 91.4 11/20/2013 0515   MCH 29.9 11/20/2013 0515   MCHC 32.7 11/20/2013 0515   RDW 14.8 11/20/2013 0515   LYMPHSABS 2.7 11/07/2013 0444   MONOABS 1.5* 11/07/2013 0444   EOSABS 0.1 11/07/2013 0444   BASOSABS 0.0 11/07/2013 0444      CMP     Component Value Date/Time   NA 137 11/20/2013 0515   K 4.3 11/20/2013 0515   CL 100 11/20/2013 0515   CO2 21 11/20/2013 0515   GLUCOSE 110* 11/20/2013 0515   BUN 118* 11/20/2013 0515   CREATININE 4.09* 11/20/2013 0515   CALCIUM 8.6 11/20/2013 0515   PROT 6.7 08/03/2011 2032   ALBUMIN 3.6 08/03/2011 2032   AST 16 08/03/2011 2032   ALT 10 08/03/2011 2032   ALKPHOS 83 08/03/2011 2032   BILITOT 0.4 08/03/2011 2032   GFRNONAA 11*  11/20/2013 0515   GFRAA 13* 11/20/2013 0515    Chest Xray Reviewed/Impressions:  1. Endotracheal and orogastric tubes in good position.  2. Similar appearance of bibasilar opacities, pleural effusions or  atelectasis at least.  3. Cardiomegaly and aortic tortuosity or enlargement. Evaluation of  the mediastinum limited by rotation  Barium swallow 1. No evidence of laryngeal penetration or tracheal aspiration.  2. Presbyesophagus.  3. No mucosal ulceration or stricture identified.      Time In Time Out Total Time Spent with Patient Total Overall Time  2 PM   3:30 PM   20 minutes   90 minutes     Greater than 50%  of this time was spent counseling and coordinating care related to the above assessment and plan.   Macy Lingenfelter L. Lovena Le, MD MBA The Palliative Medicine Team at [facility] Phone: [phone] Pager: [phone]

## 2013-11-20 NOTE — Progress Notes (Signed)
[facility] PROGRESS NOTE  [name] QMV:[id number] DOB: 17-Feb-1919 DOA: 11/07/2013 PCP: Colette Ribas, MD  TRANSFER from PCCM 1/16 89/M with multiple med issues namely CAD, CHF, recent CVA, CKD 4, Afib, admitted on 1/12 to PCCM, with ACute respiratory failure, due to pneumonia/NSTEMI requiring intubation. He is being followed by Cardiology and managed medically for NSTEMI due to CKD 4. He was extubated and transferred to [facility]. Unfortunately through this hospitalization, his kidney function has being declining, renal following now, he is being diuresed, but not a dialysis candidate and may need Palliative involvement   Assessment/Plan: 1. Acute Hypoxic resp failure -due to aspiration pneumonia and CHF/NSTEMI -s/p VDRF -extubated 1/14 -O2 weaned -volume overloaded,  continue Demadex 20 daily as recommended -Continue when necessary nebs 2. NSTEMI/Pulm edema/cardio-renal syndrome -volume overloaded, lasix IV today again, continue demadex per Cards -Renal following -on med management only for NSTEMI due to advanced age/CKD -EF 55-60% -continue BB/statin, Aggrenox dc'ed 1/24 and pt placed on Coumadin>> as patient's/family's desire is to treat all that can be treated to get him better.  -continue coumadin  3. Aspiration pneumonia -completed 7 days of abx   4.  ARF on CKD 4 -suspect ATN/cardiorenal syndrome -urine output fair -he was being gently hydrated for 2 days, without improvement in creatinine -still volume overloaded, continue demadex, Iv lasix today again -he is not a dialysis candidate ,Dr Jomarie Longs called and explained this to wife on 1/19. -Creatinine today 4.02 (from 4.22 on 1/23) -I discussed with Renal on 1/22 and as above renal recommends palliative consultation fo goals-discussed with patient and I have consulted palliative -Creatinine unchanged today.  5. Afib, HR improved -continue diltiazem and metoprolol -has been on aggrenox started per Neuro last  month after CVA but per cardiology patient will need long-term treatment if aggressive treatments desired. -As discussed above family with like to continue to treat all that can be treated to get him better. They were in agreement 1/24with placing on coumadin, Aggrenox was dc'ed 1/24. Avoiding NOAC in this patient>65 with very poor renal function  6. DM -stable, SSI  7. H/o CVA - as above and continue Coumadin, Fu with NEuro 8.Anemia -likely chronic disease associated with his CKD -hgb 8.3 s/p transfusion of PRBC on 1/24 DVT proph: hep SQ     Code Status: DNR Family Communication: none at bedside Disposition Plan: plan d/c to SNF for rehab   Consultants:  Cards  renal  HPI/Subjective: Feels about the same  Objective: Filed Vitals:   11/20/13 1248  BP: 124/80  Pulse: 64  Temp: 97.9 F (36.6 C)  Resp: 20    Intake/Output Summary (Last 24 hours) at 11/20/13 1656 Last data filed at 11/20/13 0700  Gross per 24 hour  Intake     60 ml  Output   1425 ml  Net  -1365 ml   Filed Weights   11/18/13 0540 11/19/13 0556 11/20/13 0500  Weight: 88.7 kg (195 lb 8.8 oz) 88.8 kg (195 lb 12.3 oz) 87.5 kg (192 lb 14.4 oz)    Exam:   General: alert, oriented to self, place, in NAD  Cardiovascular: S1S2/RRR  Respiratory: diminished at bases, but clear otherwise, no wheezes  Abdomen: soft, Nt, BS present  Musculoskeletal: No edema, no cyanosis  Data Reviewed: Basic Metabolic Panel:  Recent Labs Lab 11/16/13 0510 11/17/13 0444 11/18/13 0445 11/19/13 0537 11/20/13 0515  NA 135* 135* 134* 135* 137  K 4.2 4.4 4.7 4.4 4.3  CL 98 97 96 98  100  CO2 21 21 21 20 21   GLUCOSE 106* 118* 117* 122* 110*  BUN 94* 102* 105* 109* 118*  CREATININE 4.38* 4.23* 4.22* 4.02* 4.09*  CALCIUM 8.7 8.7 8.6 8.8 8.6   Liver Function Tests: No results found for this basename: AST, ALT, ALKPHOS, BILITOT, PROT, ALBUMIN,  in the last 168 hours No results found for this basename: LIPASE,  AMYLASE,  in the last 168 hours No results found for this basename: AMMONIA,  in the last 168 hours CBC:  Recent Labs Lab 11/14/13 0449 11/15/13 0509 11/18/13 0705 11/20/13 0515  WBC 8.3 9.2 9.0 8.1  HGB 8.8* 8.8* 7.9* 8.3*  HCT 27.4* 26.6* 24.4* 25.4*  MCV 93.2 91.7 91.7 91.4  PLT 237 286 311 359   Cardiac Enzymes: No results found for this basename: CKTOTAL, CKMB, CKMBINDEX, TROPONINI,  in the last 168 hours BNP (last 3 results)  Recent Labs  10/05/13 1344 11/07/13 0444  PROBNP 3785.0* 8303.0*   CBG:  Recent Labs Lab 11/19/13 1158 11/19/13 1631 11/19/13 2210 11/20/13 0722 11/20/13 1143  GLUCAP 108* 113* 148* 105* 108*    No results found for this or any previous visit (from the past 240 hour(s)).   Studies: No results found.  Scheduled Meds: . antiseptic oral rinse  15 mL Mouth Rinse q12n4p  . atorvastatin  20 mg Oral q1800  . chlorhexidine  15 mL Mouth Rinse BID  . diltiazem  180 mg Oral Daily  . doxazosin  1 mg Oral QHS  . feeding supplement (RESOURCE BREEZE)  1 Container Oral q1800  . heparin  5,000 Units Subcutaneous Q8H  . hydrALAZINE  25 mg Oral Q8H  . insulin aspart  0-5 Units Subcutaneous QHS  . insulin aspart  0-9 Units Subcutaneous TID WC  . metoprolol  50 mg Oral BID  . nystatin  5 mL Oral QID  . pantoprazole  40 mg Oral Daily  . sodium chloride  3 mL Intravenous Q12H  . torsemide  20 mg Oral Daily  . triamcinolone   Mouth/Throat BID  . warfarin  2.5 mg Oral ONCE-1800  . warfarin   Does not apply Once  . Warfarin - Pharmacist Dosing Inpatient   Does not apply q1800   Continuous Infusions:    Active Problems:   CVA (cerebral infarction)   HTN (hypertension)   CKD (chronic kidney disease), stage III   CAD (coronary artery disease)   AAA (abdominal aortic aneurysm)   Pacemaker - Medtronic adapta dual-chamber implanted 2010   Acute respiratory distress   CHF (congestive heart failure)   Acute respiratory failure   Palliative care  status   Time spent: 25min   [facility]rightis[facility] SurgicalVIYUOH,Aquarius Tremper C  [facility] Pager [phone]. If 7PM-7AM, please contact night-coverage at [email or web address], password [facility] [email or web address] 11/20/2013, 4:56 PM  LOS: 13 days

## 2013-11-20 NOTE — Progress Notes (Signed)
ANTICOAGULATION CONSULT NOTE - Follow Up Consult  Pharmacy Consult for Coumadin Indication: atrial fibrillation  Allergies  Allergen Reactions  . Lasix [Furosemide] Itching    Scalp broke out  . Bystolic [Nebivolol Hcl] Rash  . Plavix [Clopidogrel Bisulfate] Rash    Recent Labs  11/18/13 0445 11/18/13 0705 11/19/13 0537 11/19/13 1213 11/20/13 0515  HGB  --  7.9*  --   --  8.3*  HCT  --  24.4*  --   --  25.4*  PLT  --  311  --   --  359  LABPROT  --   --   --  13.2 14.6  INR  --   --   --  1.02 1.16  CREATININE 4.22*  --  4.02*  --  4.09*    Estimated Creatinine Clearance: 12.6 ml/min (by C-G formula based on Cr of 4.09).   Assessment: 80 yo male with multiple medical problems, admitted 1/12 to CCM for intubation from PNA/NSTEMI. He was taking Aggrenox PTA s/p CVA, but now converting to warfarin due to afib. Family in agreement with anticoag despite possible transition to palliative care.   INR 1.16 today. Hgb better, no bleeding. CHAD2DS2-VASc is 7 (9.6% stroke risk per year).   Goal of Therapy:  INR 2-3 Monitor platelets by anticoagulation protocol: Yes   Plan:   Coumadin 2.5mg  po x 1 tonight  Daily PT/INR  Continue sq heparin until INR nearing therapeutic  Geoffry Paradise, PharmD, BCPS Pager: [phone] 11:59 AM Pharmacy #: [id number]

## 2013-11-21 LAB — BASIC METABOLIC PANEL
BUN: 119 mg/dL — ABNORMAL HIGH (ref 6–23)
CALCIUM: 9.2 mg/dL (ref 8.4–10.5)
CO2: 18 meq/L — AB (ref 19–32)
Chloride: 100 mEq/L (ref 96–112)
Creatinine, Ser: 3.87 mg/dL — ABNORMAL HIGH (ref 0.50–1.35)
GFR calc Af Amer: 14 mL/min — ABNORMAL LOW (ref >90)
GFR calc non Af Amer: 12 mL/min — ABNORMAL LOW (ref >90)
Glucose, Bld: 118 mg/dL — ABNORMAL HIGH (ref 70–99)
POTASSIUM: 4.3 meq/L (ref 3.7–5.3)
Sodium: 139 mEq/L (ref 137–147)

## 2013-11-21 LAB — GLUCOSE, CAPILLARY
GLUCOSE-CAPILLARY: 147 mg/dL — AB (ref 70–99)
Glucose-Capillary: 106 mg/dL — ABNORMAL HIGH (ref 70–99)
Glucose-Capillary: 125 mg/dL — ABNORMAL HIGH (ref 70–99)
Glucose-Capillary: 117 mg/dL — ABNORMAL HIGH (ref 70–99)

## 2013-11-21 LAB — PROTIME-INR
INR: 1.15 (ref 0.00–1.49)
PROTHROMBIN TIME: 14.5 s (ref 11.6–15.2)

## 2013-11-21 MED ORDER — BISACODYL 10 MG RE SUPP
10.0000 mg | Freq: Every day | RECTAL | Status: DC | PRN
  Administered 2013-11-22: 10 mg via RECTAL
  Filled 2013-11-21: qty 1

## 2013-11-21 MED ORDER — WARFARIN SODIUM 2.5 MG PO TABS
2.5000 mg | ORAL_TABLET | Freq: Once | ORAL | Status: AC
  Administered 2013-11-21: 2.5 mg via ORAL
  Filled 2013-11-21: qty 1

## 2013-11-21 NOTE — Progress Notes (Signed)
CSW continues to follow for discharge planning - CSW confirmed with Tami @ [facility] that they still have a bed available for patient when ready.   Awaiting outcome of Palliative Care meeting which is scheduled for this afternoon. Will follow-up tomorrow.   Unice Bailey, LCSW  [facility]  Clinical Social Worker  cell #: [phone]

## 2013-11-21 NOTE — Progress Notes (Signed)
ANTICOAGULATION CONSULT NOTE - Follow Up Consult  Pharmacy Consult for Coumadin Indication: atrial fibrillation  Allergies  Allergen Reactions  . Lasix [Furosemide] Itching    Scalp broke out  . Bystolic [Nebivolol Hcl] Rash  . Plavix [Clopidogrel Bisulfate] Rash    Recent Labs  11/19/13 0537 11/19/13 1213 11/20/13 0515 11/21/13 0529  HGB  --   --  8.3*  --   HCT  --   --  25.4*  --   PLT  --   --  359  --   LABPROT  --  13.2 14.6 14.5  INR  --  1.02 1.16 1.15  CREATININE 4.02*  --  4.09* 3.87*    Estimated Creatinine Clearance: 13.4 ml/min (by C-G formula based on Cr of 3.87).   Assessment: 80 yo male with multiple medical problems, admitted 1/12 to CCM for intubation from PNA/NSTEMI. He was taking Aggrenox PTA s/p CVA, but now converting to warfarin due to afib. Family in agreement with anticoag despite possible transition to palliative care.   INR still subtherapeutic and not responding yet to warfarin 2.5mg  x 2 doses  Hgb not checked today, no bleeding. CHAD2DS2-VASc is 7 (9.6% stroke risk per year).   Goal of Therapy:  INR 2-3 Monitor platelets by anticoagulation protocol: Yes   Plan:   Repeat Coumadin 2.5mg  po x 1 tonight as can expect INR to start responding likely tomorrow  Daily PT/INR  Continue sq heparin until INR nearing therapeutic   Hessie Knows, PharmD, BCPS Pager [phone] 11/21/2013 8:04 AM

## 2013-11-21 NOTE — Progress Notes (Signed)
Physical Therapy Treatment Patient Details Name: [name] MRN: [id number] DOB: [birth year] Today's Date: 11/21/2013 Time: 0383-3383 PT Time Calculation (min): 27 min  PT Assessment / Plan / Recommendation  History of Present Illness 80 y.o. BIBEMS to [facility] in respiratory distress, hypoxic, reqired ventilator, extubated 1/14. Pt has recent extension of lacunar infarct.   PT Comments   *Pt required assist for supine to sit,  participated in BLE exercises, sat on EOB x 5 min. Pt fatigues quickly.**  Follow Up Recommendations  SNF     Does the patient have the potential to tolerate intense rehabilitation     Barriers to Discharge        Equipment Recommendations  None recommended by PT    Recommendations for Other Services    Frequency Min 3X/week   Progress towards PT Goals Progress towards PT goals: Progressing toward goals  Plan Current plan remains appropriate    Precautions / Restrictions Precautions Precautions: Fall Precaution Comments: swallow precautions   Pertinent Vitals/Pain *Pt reports lower abdominal discomfort due to constipation, RN aware**    Mobility  Bed Mobility Overal bed mobility: Needs Assistance Bed Mobility: Rolling;Sidelying to Sit Rolling: Mod assist Sidelying to sit: +2 for physical assistance;Max assist General bed mobility comments: pt able to move legs over EOB and assist to raise trunk, pt 40%    Exercises General Exercises - Lower Extremity Ankle Circles/Pumps: AROM;Both;15 reps;Supine Short Arc Quad: AAROM;Both;15 reps;Supine Long Arc Quad: AROM;Both;10 reps;Seated Heel Slides: AAROM;Both;15 reps;Supine Hip ABduction/ADduction: AAROM;Both;15 reps;Supine   PT Diagnosis:    PT Problem List:   PT Treatment Interventions:     PT Goals (current goals can now be found in the care plan section) Acute Rehab PT Goals Patient Stated Goal: agreed to get up PT Goal Formulation: With patient Time For Goal Achievement: 11/25/13 Potential  to Achieve Goals: Fair  Visit Information  Last PT Received On: 11/21/13 Assistance Needed: +2 History of Present Illness: 80 y.o. BIBEMS to [facility] in respiratory distress, hypoxic, reqired ventilator, extubated 1/14. Pt has recent extension of lacunar infarct.    Subjective Data  Patient Stated Goal: agreed to get up   Cognition  Cognition Arousal/Alertness: Awake/alert Behavior During Therapy: WFL for tasks assessed/performed Overall Cognitive Status: No family/caregiver present to determine baseline cognitive functioning (slow processing at times)    Balance  Balance Overall balance assessment: Needs assistance Sitting-balance support: Feet supported;Single extremity supported Sitting balance-Leahy Scale: Poor Sitting balance - Comments: Pt sat on EOB x 5 minutes with SBA  End of Session PT - End of Session Equipment Utilized During Treatment: Oxygen Activity Tolerance: Patient limited by fatigue Patient left: with call bell/phone within reach;in bed (family/visitor entered room end of session) Nurse Communication: Mobility status;Need for lift equipment   GP     Tamala Ser 11/21/2013, 1:20 PM [phone]

## 2013-11-21 NOTE — Progress Notes (Signed)
[facility] PROGRESS NOTE  Darius PallWilliam E Castillo ZOX:[id number] DOB: [birth year] DOA: 11/07/2013 PCP: Colette RibasGOLDING, JOHN CABOT, MD  TRANSFER from PCCM 1/16 89/M with multiple med issues namely CAD, CHF, recent CVA, CKD 4, Afib, admitted on 1/12 to PCCM, with ACute respiratory failure, due to pneumonia/NSTEMI requiring intubation. He is being followed by Cardiology and managed medically for NSTEMI due to CKD 4. [facility] Unfortunately through this hospitalization, his kidney function has being declining, renal following now, he is being diuresed, but not a dialysis candidate and may need Palliative involvement   Assessment/Plan: 1. Acute Hypoxic resp failure -due to aspiration pneumonia and CHF/NSTEMI -s/p VDRF -extubated 1/14 -O2 weaned -volume overloaded,  continue Demadex 20 daily as recommended -Continue when necessary nebs 2. NSTEMI/Pulm edema/cardio-renal syndrome -volume overloaded, lasix IV today again, continue demadex per Cards -Renal following -on med management only for NSTEMI due to advanced age/CKD -EF 55-60% -continue BB/statin, Aggrenox dc'ed 1/24 and pt placed on Coumadin>> as patient's/family's desire is to treat all that can be treated to get him better.  -continue coumadin  3. Aspiration pneumonia -completed 7 days of abx   4.  ARF on CKD 4 -suspect ATN/cardiorenal syndrome -urine output fair -he was being gently hydrated for 2 days, without improvement in creatinine -still volume overloaded, continue demadex, Iv lasix today again -he is not a dialysis candidate ,Dr Jomarie LongsJoseph called and explained this to wife on 1/19. -I discussed with Renal on 1/22 and as above renal recommends palliative consultation fo goals-discussed with patient and I have consulted palliative -Creatinine trending down today 3.87 (from 4.09) -Awaiting palliative care followup meeting today, possible SNF soon 5. Afib, HR improved -continue diltiazem and  metoprolol -has been on aggrenox started per Neuro last month after CVA but per cardiology patient will need long-term treatment if aggressive treatments desired. -As discussed above family with like to continue to treat all that can be treated to get him better. They were in agreement 1/24with placing on coumadin, Aggrenox was dc'ed 1/24. Avoiding NOAC in this patient>65 with very poor renal function -Continue Coumadin INR 1.15 6. DM -stable, SSI  7. H/o CVA - as above and continue Coumadin, Fu with NEuro 8.Anemia -likely chronic disease associated with his CKD -hgb 8.3 s/p transfusion of PRBC on 1/24 9. Constipation -Continue Senokot, will add Dulcolax when necessary DVT proph: hep SQ     Code Status: DNR Family Communication: none at bedside Disposition Plan: plan d/c to SNF for rehab   Consultants:  Cards  Renal  Palliative care  HPI/Subjective: Complaining of constipation. No shortness of breath reported  Objective: Filed Vitals:   11/21/13 0547  BP: 120/66  Pulse: 65  Temp: 97.7 F (36.5 C)  Resp: 16    Intake/Output Summary (Last 24 hours) at 11/21/13 1118 Last data filed at 11/21/13 0850  Gross per 24 hour  Intake     60 ml  Output    925 ml  Net   -865 ml   Filed Weights   11/19/13 0556 11/20/13 0500 11/21/13 0547  Weight: 88.8 kg (195 lb 12.3 oz) 87.5 kg (192 lb 14.4 oz) 87.9 kg (193 lb 12.6 oz)    Exam:   General: alert, oriented to self, place, in NAD  Cardiovascular: S1S2/RRR  Respiratory: Few crackles at bases, no wheezes  Abdomen: soft, Nt, BS present  Musculoskeletal: No edema, no cyanosis  Data Reviewed: Basic Metabolic Panel:  Recent Labs Lab 11/17/13 0444 11/18/13 0445 11/19/13 0537  11/20/13 0515 11/21/13 0529  NA 135* 134* 135* 137 139  K 4.4 4.7 4.4 4.3 4.3  CL 97 96 98 100 100  CO2 21 21 20 21  18*  GLUCOSE 118* 117* 122* 110* 118*  BUN 102* 105* 109* 118* 119*  CREATININE 4.23* 4.22* 4.02* 4.09* 3.87*   CALCIUM 8.7 8.6 8.8 8.6 9.2   Liver Function Tests: No results found for this basename: AST, ALT, ALKPHOS, BILITOT, PROT, ALBUMIN,  in the last 168 hours No results found for this basename: LIPASE, AMYLASE,  in the last 168 hours No results found for this basename: AMMONIA,  in the last 168 hours CBC:  Recent Labs Lab 11/15/13 0509 11/18/13 0705 11/20/13 0515  WBC 9.2 9.0 8.1  HGB 8.8* 7.9* 8.3*  HCT 26.6* 24.4* 25.4*  MCV 91.7 91.7 91.4  PLT 286 311 359   Cardiac Enzymes: No results found for this basename: CKTOTAL, CKMB, CKMBINDEX, TROPONINI,  in the last 168 hours BNP (last 3 results)  Recent Labs  10/05/13 1344 11/07/13 0444  PROBNP 3785.0* 8303.0*   CBG:  Recent Labs Lab 11/20/13 0722 11/20/13 1143 11/20/13 1710 11/20/13 2204 11/21/13 0725  GLUCAP 105* 108* 104* 126* 106*    No results found for this or any previous visit (from the past 240 hour(s)).   Studies: No results found.  Scheduled Meds: . antiseptic oral rinse  15 mL Mouth Rinse q12n4p  . atorvastatin  20 mg Oral q1800  . diltiazem  180 mg Oral Daily  . doxazosin  1 mg Oral QHS  . feeding supplement (RESOURCE BREEZE)  1 Container Oral q1800  . heparin  5,000 Units Subcutaneous Q8H  . hydrALAZINE  25 mg Oral Q8H  . insulin aspart  0-5 Units Subcutaneous QHS  . insulin aspart  0-9 Units Subcutaneous TID WC  . metoprolol  50 mg Oral BID  . nystatin  5 mL Oral QID  . pantoprazole  40 mg Oral Daily  . sodium chloride  3 mL Intravenous Q12H  . torsemide  20 mg Oral Daily  . triamcinolone   Mouth/Throat BID  . warfarin  2.5 mg Oral ONCE-1800  . warfarin   Does not apply Once  . Warfarin - Pharmacist Dosing Inpatient   Does not apply q1800   Continuous Infusions:    Active Problems:   CVA (cerebral infarction)   HTN (hypertension)   CKD (chronic kidney disease), stage III   CAD (coronary artery disease)   AAA (abdominal aortic aneurysm)   Pacemaker - Medtronic adapta dual-chamber  implanted 2010   Acute respiratory distress   CHF (congestive heart failure)   Acute respiratory failure   Palliative care status   Time spent:   [facility]  [facility] Pager [phone]. If 7PM-7AM, please contact night-coverage at [email or web address], password [facility] 11/21/2013, 11:18 AM  LOS: 14 days

## 2013-11-22 ENCOUNTER — Emergency Department (HOSPITAL_COMMUNITY): Payer: Medicare Other

## 2013-11-22 ENCOUNTER — Encounter (HOSPITAL_COMMUNITY): Payer: Self-pay | Admitting: Emergency Medicine

## 2013-11-22 ENCOUNTER — Inpatient Hospital Stay (HOSPITAL_COMMUNITY)
Admission: EM | Admit: 2013-11-22 | Discharge: 2013-11-24 | DRG: 292 | Disposition: A | Discharge status: Skilled Nursing Facility | Payer: Medicare Other | Attending: Internal Medicine | Admitting: Internal Medicine

## 2013-11-22 DIAGNOSIS — N184 Chronic kidney disease, stage 4 (severe): Secondary | ICD-10-CM | POA: Diagnosis present

## 2013-11-22 DIAGNOSIS — R0603 Acute respiratory distress: Secondary | ICD-10-CM

## 2013-11-22 DIAGNOSIS — I214 Non-ST elevation (NSTEMI) myocardial infarction: Secondary | ICD-10-CM | POA: Diagnosis present

## 2013-11-22 DIAGNOSIS — E785 Hyperlipidemia, unspecified: Secondary | ICD-10-CM

## 2013-11-22 DIAGNOSIS — I5033 Acute on chronic diastolic (congestive) heart failure: Principal | ICD-10-CM | POA: Diagnosis present

## 2013-11-22 DIAGNOSIS — E860 Dehydration: Secondary | ICD-10-CM

## 2013-11-22 DIAGNOSIS — N19 Unspecified kidney failure: Secondary | ICD-10-CM

## 2013-11-22 DIAGNOSIS — I2510 Atherosclerotic heart disease of native coronary artery without angina pectoris: Secondary | ICD-10-CM | POA: Diagnosis present

## 2013-11-22 DIAGNOSIS — I509 Heart failure, unspecified: Secondary | ICD-10-CM | POA: Diagnosis present

## 2013-11-22 DIAGNOSIS — I10 Essential (primary) hypertension: Secondary | ICD-10-CM | POA: Diagnosis present

## 2013-11-22 DIAGNOSIS — D649 Anemia, unspecified: Secondary | ICD-10-CM

## 2013-11-22 DIAGNOSIS — R29810 Facial weakness: Secondary | ICD-10-CM

## 2013-11-22 DIAGNOSIS — K219 Gastro-esophageal reflux disease without esophagitis: Secondary | ICD-10-CM | POA: Diagnosis present

## 2013-11-22 DIAGNOSIS — R29898 Other symptoms and signs involving the musculoskeletal system: Secondary | ICD-10-CM

## 2013-11-22 DIAGNOSIS — R531 Weakness: Secondary | ICD-10-CM

## 2013-11-22 DIAGNOSIS — M129 Arthropathy, unspecified: Secondary | ICD-10-CM | POA: Diagnosis present

## 2013-11-22 DIAGNOSIS — I639 Cerebral infarction, unspecified: Secondary | ICD-10-CM | POA: Diagnosis present

## 2013-11-22 DIAGNOSIS — R0789 Other chest pain: Secondary | ICD-10-CM

## 2013-11-22 DIAGNOSIS — Z515 Encounter for palliative care: Secondary | ICD-10-CM

## 2013-11-22 DIAGNOSIS — I219 Acute myocardial infarction, unspecified: Secondary | ICD-10-CM | POA: Diagnosis present

## 2013-11-22 DIAGNOSIS — Z7901 Long term (current) use of anticoagulants: Secondary | ICD-10-CM

## 2013-11-22 DIAGNOSIS — I1310 Hypertensive heart and chronic kidney disease without heart failure, with stage 1 through stage 4 chronic kidney disease, or unspecified chronic kidney disease: Secondary | ICD-10-CM

## 2013-11-22 DIAGNOSIS — I5032 Chronic diastolic (congestive) heart failure: Secondary | ICD-10-CM

## 2013-11-22 DIAGNOSIS — I129 Hypertensive chronic kidney disease with stage 1 through stage 4 chronic kidney disease, or unspecified chronic kidney disease: Secondary | ICD-10-CM | POA: Diagnosis present

## 2013-11-22 DIAGNOSIS — I714 Abdominal aortic aneurysm, without rupture, unspecified: Secondary | ICD-10-CM | POA: Diagnosis present

## 2013-11-22 DIAGNOSIS — Z794 Long term (current) use of insulin: Secondary | ICD-10-CM

## 2013-11-22 DIAGNOSIS — I252 Old myocardial infarction: Secondary | ICD-10-CM

## 2013-11-22 DIAGNOSIS — J9600 Acute respiratory failure, unspecified whether with hypoxia or hypercapnia: Secondary | ICD-10-CM

## 2013-11-22 DIAGNOSIS — Z66 Do not resuscitate: Secondary | ICD-10-CM | POA: Diagnosis present

## 2013-11-22 DIAGNOSIS — E119 Type 2 diabetes mellitus without complications: Secondary | ICD-10-CM | POA: Diagnosis present

## 2013-11-22 DIAGNOSIS — Z7401 Bed confinement status: Secondary | ICD-10-CM

## 2013-11-22 DIAGNOSIS — J90 Pleural effusion, not elsewhere classified: Secondary | ICD-10-CM | POA: Diagnosis present

## 2013-11-22 DIAGNOSIS — R5381 Other malaise: Secondary | ICD-10-CM | POA: Diagnosis present

## 2013-11-22 DIAGNOSIS — Z950 Presence of cardiac pacemaker: Secondary | ICD-10-CM | POA: Diagnosis present

## 2013-11-22 DIAGNOSIS — B370 Candidal stomatitis: Secondary | ICD-10-CM | POA: Diagnosis present

## 2013-11-22 DIAGNOSIS — I4891 Unspecified atrial fibrillation: Secondary | ICD-10-CM | POA: Diagnosis present

## 2013-11-22 DIAGNOSIS — D638 Anemia in other chronic diseases classified elsewhere: Secondary | ICD-10-CM | POA: Diagnosis present

## 2013-11-22 DIAGNOSIS — N179 Acute kidney failure, unspecified: Secondary | ICD-10-CM

## 2013-11-22 DIAGNOSIS — J4489 Other specified chronic obstructive pulmonary disease: Secondary | ICD-10-CM | POA: Diagnosis present

## 2013-11-22 DIAGNOSIS — Z8673 Personal history of transient ischemic attack (TIA), and cerebral infarction without residual deficits: Secondary | ICD-10-CM

## 2013-11-22 DIAGNOSIS — J449 Chronic obstructive pulmonary disease, unspecified: Secondary | ICD-10-CM | POA: Diagnosis present

## 2013-11-22 DIAGNOSIS — R5383 Other fatigue: Secondary | ICD-10-CM

## 2013-11-22 HISTORY — DX: Unspecified atrial fibrillation: I48.91

## 2013-11-22 HISTORY — DX: Pleural effusion, not elsewhere classified: J90

## 2013-11-22 HISTORY — DX: Type 2 diabetes mellitus without complications: E11.9

## 2013-11-22 HISTORY — DX: Acute respiratory failure, unspecified whether with hypoxia or hypercapnia: J96.00

## 2013-11-22 HISTORY — DX: Anemia, unspecified: D64.9

## 2013-11-22 LAB — CBC WITH DIFFERENTIAL/PLATELET
BASOS PCT: 0 % (ref 0–1)
Basophils Absolute: 0 10*3/uL (ref 0.0–0.1)
EOS ABS: 0 10*3/uL (ref 0.0–0.7)
EOS PCT: 0 % (ref 0–5)
HEMATOCRIT: 26.8 % — AB (ref 39.0–52.0)
HEMOGLOBIN: 8.8 g/dL — AB (ref 13.0–17.0)
LYMPHS ABS: 0.7 10*3/uL (ref 0.7–4.0)
Lymphocytes Relative: 8 % — ABNORMAL LOW (ref 12–46)
MCH: 30.6 pg (ref 26.0–34.0)
MCHC: 32.8 g/dL (ref 30.0–36.0)
MCV: 93.1 fL (ref 78.0–100.0)
MONO ABS: 1 10*3/uL (ref 0.1–1.0)
MONOS PCT: 11 % (ref 3–12)
Neutro Abs: 7.4 10*3/uL (ref 1.7–7.7)
Neutrophils Relative %: 81 % — ABNORMAL HIGH (ref 43–77)
Platelets: 403 10*3/uL — ABNORMAL HIGH (ref 150–400)
RBC: 2.88 MIL/uL — AB (ref 4.22–5.81)
RDW: 14.7 % (ref 11.5–15.5)
WBC: 9.2 10*3/uL (ref 4.0–10.5)

## 2013-11-22 LAB — BASIC METABOLIC PANEL
BUN: 122 mg/dL — ABNORMAL HIGH (ref 6–23)
BUN: 122 mg/dL — AB (ref 6–23)
CHLORIDE: 106 meq/L (ref 96–112)
CO2: 22 meq/L (ref 19–32)
CO2: 22 mEq/L (ref 19–32)
CREATININE: 3.68 mg/dL — AB (ref 0.50–1.35)
Calcium: 9.1 mg/dL (ref 8.4–10.5)
Calcium: 9.7 mg/dL (ref 8.4–10.5)
Chloride: 102 mEq/L (ref 96–112)
Creatinine, Ser: 3.7 mg/dL — ABNORMAL HIGH (ref 0.50–1.35)
GFR calc Af Amer: 15 mL/min — ABNORMAL LOW (ref >90)
GFR calc non Af Amer: 13 mL/min — ABNORMAL LOW (ref >90)
GFR, EST AFRICAN AMERICAN: 15 mL/min — AB (ref >90)
GFR, EST NON AFRICAN AMERICAN: 13 mL/min — AB (ref >90)
Glucose, Bld: 130 mg/dL — ABNORMAL HIGH (ref 70–99)
Glucose, Bld: 129 mg/dL — ABNORMAL HIGH (ref 70–99)
Potassium: 4.3 mEq/L (ref 3.7–5.3)
Potassium: 4 mEq/L (ref 3.7–5.3)
Sodium: 143 mEq/L (ref 137–147)
Sodium: 142 mEq/L (ref 137–147)

## 2013-11-22 LAB — TROPONIN I: Troponin I: <0.3 ng/mL (ref <0.30)

## 2013-11-22 LAB — GLUCOSE, CAPILLARY
GLUCOSE-CAPILLARY: 113 mg/dL — AB (ref 70–99)
GLUCOSE-CAPILLARY: 104 mg/dL — AB (ref 70–99)
Glucose-Capillary: 114 mg/dL — ABNORMAL HIGH (ref 70–99)

## 2013-11-22 LAB — PROTIME-INR
INR: 1.31 (ref 0.00–1.49)
Prothrombin Time: 16 seconds — ABNORMAL HIGH (ref 11.6–15.2)

## 2013-11-22 LAB — PRO B NATRIURETIC PEPTIDE: Pro B Natriuretic peptide (BNP): 27104 pg/mL — ABNORMAL HIGH (ref 0–450)

## 2013-11-22 MED ORDER — TRIAMCINOLONE ACETONIDE 0.1 % MT PSTE
PASTE | OROMUCOSAL | Status: AC
  Filled 2013-11-22: qty 5

## 2013-11-22 MED ORDER — BIOTENE DRY MOUTH MT LIQD: 15.0000 mL | Freq: Two times a day (BID) | OROMUCOSAL | Status: AC

## 2013-11-22 MED ORDER — BISACODYL 10 MG RE SUPP: 10.0000 mg | Freq: Every day | RECTAL | Status: AC | PRN

## 2013-11-22 MED ORDER — DILTIAZEM HCL ER COATED BEADS 180 MG PO CP24
180.0000 mg | ORAL_CAPSULE | Freq: Every day | ORAL | Status: DC
  Administered 2013-11-23 – 2013-11-24 (×2): 180 mg via ORAL
  Filled 2013-11-22 (×2): qty 1

## 2013-11-22 MED ORDER — INSULIN ASPART 100 UNIT/ML ~~LOC~~ SOLN: 0.0000 [IU] | Freq: Every day | SUBCUTANEOUS | Status: DC

## 2013-11-22 MED ORDER — DOXAZOSIN MESYLATE 2 MG PO TABS
1.0000 mg | ORAL_TABLET | Freq: Every day | ORAL | Status: DC
  Administered 2013-11-22 – 2013-11-23 (×2): 1 mg via ORAL
  Filled 2013-11-22 (×2): qty 1

## 2013-11-22 MED ORDER — INSULIN ASPART 100 UNIT/ML ~~LOC~~ SOLN: 0.0000 [IU] | Freq: Three times a day (TID) | SUBCUTANEOUS | Status: DC

## 2013-11-22 MED ORDER — SODIUM CHLORIDE 0.9 % IV SOLN
1020.0000 mg | Freq: Once | INTRAVENOUS | Status: AC
  Administered 2013-11-22: 1020 mg via INTRAVENOUS
  Filled 2013-11-22: qty 34

## 2013-11-22 MED ORDER — TRIAMCINOLONE ACETONIDE 0.1 % MT PSTE
PASTE | Freq: Two times a day (BID) | OROMUCOSAL | Status: DC
  Administered 2013-11-22: 23:00:00 via OROMUCOSAL
  Administered 2013-11-23: 1 via OROMUCOSAL
  Administered 2013-11-23 – 2013-11-24 (×2): via OROMUCOSAL
  Filled 2013-11-22: qty 5

## 2013-11-22 MED ORDER — METOPROLOL TARTRATE 50 MG PO TABS
50.0000 mg | ORAL_TABLET | Freq: Two times a day (BID) | ORAL | Status: DC
  Administered 2013-11-22 – 2013-11-24 (×4): 50 mg via ORAL
  Filled 2013-11-22 (×4): qty 2

## 2013-11-22 MED ORDER — HYPROMELLOSE (GONIOSCOPIC) 2.5 % OP SOLN
1.0000 [drp] | Freq: Three times a day (TID) | OPHTHALMIC | Status: DC | PRN
  Filled 2013-11-22: qty 15

## 2013-11-22 MED ORDER — NITROGLYCERIN 0.4 MG SL SUBL
0.4000 mg | SUBLINGUAL_TABLET | SUBLINGUAL | Status: DC | PRN
  Administered 2013-11-22: 0.4 mg via SUBLINGUAL

## 2013-11-22 MED ORDER — ATORVASTATIN CALCIUM 20 MG PO TABS
20.0000 mg | ORAL_TABLET | Freq: Every day | ORAL | Status: DC
  Administered 2013-11-22 – 2013-11-23 (×2): 20 mg via ORAL
  Filled 2013-11-22 (×2): qty 1

## 2013-11-22 MED ORDER — NITROGLYCERIN 0.4 MG SL SUBL
SUBLINGUAL_TABLET | SUBLINGUAL | Status: AC
  Filled 2013-11-22: qty 62.5

## 2013-11-22 MED ORDER — ASPIRIN 81 MG PO CHEW
324.0000 mg | CHEWABLE_TABLET | Freq: Once | ORAL | Status: AC
  Administered 2013-11-22: 324 mg via ORAL
  Filled 2013-11-22: qty 4

## 2013-11-22 MED ORDER — POLYVINYL ALCOHOL 1.4 % OP SOLN: 1.0000 [drp] | Freq: Three times a day (TID) | OPHTHALMIC | Status: DC | PRN

## 2013-11-22 MED ORDER — HYDRALAZINE HCL 25 MG PO TABS: 25.0000 mg | ORAL_TABLET | Freq: Three times a day (TID) | ORAL | Status: DC

## 2013-11-22 MED ORDER — OCUVITE PO TABS
1.0000 | ORAL_TABLET | Freq: Every day | ORAL | Status: DC
  Administered 2013-11-23 – 2013-11-24 (×2): 1 via ORAL
  Filled 2013-11-22 (×3): qty 1

## 2013-11-22 MED ORDER — WARFARIN SODIUM 2.5 MG PO TABS
2.5000 mg | ORAL_TABLET | Freq: Once | ORAL | Status: DC
  Filled 2013-11-22: qty 1

## 2013-11-22 MED ORDER — BUMETANIDE 0.25 MG/ML IJ SOLN
1.0000 mg | Freq: Once | INTRAMUSCULAR | Status: AC
  Administered 2013-11-22: 1 mg via INTRAVENOUS
  Filled 2013-11-22: qty 4

## 2013-11-22 MED ORDER — POLYVINYL ALCOHOL 1.4 % OP SOLN: 1.0000 [drp] | Freq: Three times a day (TID) | OPHTHALMIC | Status: AC | PRN

## 2013-11-22 MED ORDER — HYDRALAZINE HCL 25 MG PO TABS
25.0000 mg | ORAL_TABLET | Freq: Three times a day (TID) | ORAL | Status: DC
  Administered 2013-11-22 – 2013-11-24 (×5): 25 mg via ORAL
  Filled 2013-11-22 (×3): qty 1

## 2013-11-22 MED ORDER — ALBUTEROL SULFATE (2.5 MG/3ML) 0.083% IN NEBU: 2.5000 mg | INHALATION_SOLUTION | RESPIRATORY_TRACT | Status: DC | PRN

## 2013-11-22 MED ORDER — NITROGLYCERIN 2 % TD OINT
1.0000 [in_us] | TOPICAL_OINTMENT | Freq: Once | TRANSDERMAL | Status: AC
  Administered 2013-11-22: 1 [in_us] via TOPICAL
  Filled 2013-11-22: qty 1

## 2013-11-22 MED ORDER — INSULIN ASPART 100 UNIT/ML ~~LOC~~ SOLN
0.0000 [IU] | Freq: Three times a day (TID) | SUBCUTANEOUS | Status: DC
Start: 2013-11-23 — End: 2013-11-23

## 2013-11-22 MED ORDER — SODIUM CHLORIDE 0.9 % IJ SOLN: 3.0000 mL | INTRAMUSCULAR | Status: DC | PRN

## 2013-11-22 MED ORDER — FLUTICASONE PROPIONATE 50 MCG/ACT NA SUSP
2.0000 | Freq: Every day | NASAL | Status: DC | PRN
  Filled 2013-11-22: qty 16

## 2013-11-22 MED ORDER — TORSEMIDE 20 MG PO TABS
20.0000 mg | ORAL_TABLET | Freq: Every day | ORAL | Status: DC
  Administered 2013-11-23 – 2013-11-24 (×2): 20 mg via ORAL
  Filled 2013-11-22 (×2): qty 1

## 2013-11-22 MED ORDER — TRIAMCINOLONE ACETONIDE 0.1 % MT PSTE
PASTE | Freq: Two times a day (BID) | OROMUCOSAL | Status: DC
Start: 2013-11-22 — End: 2013-11-24

## 2013-11-22 MED ORDER — BIOTENE DRY MOUTH MT LIQD
15.0000 mL | Freq: Two times a day (BID) | OROMUCOSAL | Status: DC
  Administered 2013-11-23 (×2): 15 mL via OROMUCOSAL

## 2013-11-22 MED ORDER — ONDANSETRON HCL 4 MG/2ML IJ SOLN: 4.0000 mg | Freq: Four times a day (QID) | INTRAMUSCULAR | Status: DC | PRN

## 2013-11-22 MED ORDER — BISACODYL 10 MG RE SUPP: 10.0000 mg | Freq: Every day | RECTAL | Status: DC | PRN

## 2013-11-22 MED ORDER — BOOST / RESOURCE BREEZE PO LIQD: 1.0000 | Freq: Every day | ORAL | Status: AC

## 2013-11-22 MED ORDER — NITROGLYCERIN 0.4 MG/SPRAY TL SOLN
1.0000 | Status: DC | PRN
  Filled 2013-11-22: qty 4.9

## 2013-11-22 MED ORDER — ACETAMINOPHEN 325 MG PO TABS: 650.0000 mg | ORAL_TABLET | ORAL | Status: DC | PRN

## 2013-11-22 MED ORDER — SODIUM CHLORIDE 0.9 % IV SOLN: 250.0000 mL | INTRAVENOUS | Status: DC | PRN

## 2013-11-22 MED ORDER — WARFARIN SODIUM 2.5 MG PO TABS: 2.5000 mg | ORAL_TABLET | Freq: Every day | ORAL | Status: DC

## 2013-11-22 MED ORDER — BOOST / RESOURCE BREEZE PO LIQD
1.0000 | Freq: Every day | ORAL | Status: DC
  Administered 2013-11-23: 1 via ORAL

## 2013-11-22 MED ORDER — FERROUS SULFATE 325 (65 FE) MG PO TABS
325.0000 mg | ORAL_TABLET | Freq: Every day | ORAL | Status: DC
  Administered 2013-11-22 – 2013-11-24 (×3): 325 mg via ORAL
  Filled 2013-11-22 (×3): qty 1

## 2013-11-22 MED ORDER — PANTOPRAZOLE SODIUM 40 MG PO TBEC
80.0000 mg | DELAYED_RELEASE_TABLET | Freq: Every day | ORAL | Status: DC
  Administered 2013-11-22 – 2013-11-24 (×3): 80 mg via ORAL
  Filled 2013-11-22 (×3): qty 2

## 2013-11-22 MED ORDER — SODIUM CHLORIDE 0.9 % IJ SOLN
3.0000 mL | Freq: Two times a day (BID) | INTRAMUSCULAR | Status: DC
  Administered 2013-11-22 – 2013-11-24 (×4): 3 mL via INTRAVENOUS

## 2013-11-22 MED ORDER — INSULIN ASPART 100 UNIT/ML ~~LOC~~ SOLN
0.0000 [IU] | Freq: Every day | SUBCUTANEOUS | Status: DC
Start: 2013-11-22 — End: 2013-11-23

## 2013-11-22 MED ORDER — SENNA 8.6 MG PO TABS: 2.0000 | ORAL_TABLET | Freq: Every day | ORAL | Status: DC | PRN

## 2013-11-22 NOTE — H&P (Signed)
PCP:   Colette Ribas, MD   Chief Complaint:  Sob and cp  HPI: 80 yo male h/o diastolic chf, recent nstemi, ckd, bedbound state just [facility] today was intubated for several days, suffered from AMI and chf which was not responding well to diuresis with worsening of his renal function.  Pt was d/c today to snf, but while in ambulance ride he got very sob and started having sscp.  snf sent him straight to ED.  His recent nstemi is being treated medically only due to the advancement of his poor renal function.  No fevers.  He feels better now.  No cough.  No le edema or swelling.    Review of Systems:  Positive and negative as per HPI otherwise all other systems are negative  Past Medical History: Past Medical History  Diagnosis Date  . Coronary artery disease   . Hypertension   . Pacemaker 05/08/2009    Medtronic  . CKD (chronic kidney disease), stage III   . Complete heart block   . GERD (gastroesophageal reflux disease)   . Diastolic heart failure   . Myocardial infarction 2010  . Stroke 1998, 2005, 2009  . Shingles   . CHF (congestive heart failure)     diastolic  . COPD (chronic obstructive pulmonary disease)   . Arthritis   . Left-sided weakness   . Peripheral edema   . AAA (abdominal aortic aneurysm)     infrarenal 03/12/12- 3.7 x 3.5 cm   Past Surgical History  Procedure Laterality Date  . Eye surgery      tear duct probing  . Permanent pacemaker insertion  05/08/2009    Medtronic  . Hernia repair  10 yrs ago    [facility]  . Cataract extraction w/phaco  02/03/2012    Procedure: CATARACT EXTRACTION PHACO AND INTRAOCULAR LENS PLACEMENT (IOC);  Surgeon: Loraine Leriche T. Nile Debrosse, MD;  Location: [facility];  Service: Ophthalmology;  Laterality: Left;  CDE=29.12  . Cataract extraction w/phaco  03/23/2012    Procedure: CATARACT EXTRACTION PHACO AND INTRAOCULAR LENS PLACEMENT (IOC);  Surgeon: Loraine Leriche T. Nile Shorkey, MD;  Location: [facility];  Service: Ophthalmology;  Laterality:  Right;  CDE 22.98  . Nm myocar perf wall motion  06/13/2009    mild to mod ischemia basal inferior & mid inferior regions    Medications: Prior to Admission medications   Medication Sig Start Date End Date Taking? Authorizing Provider  albuterol (PROVENTIL) (2.5 MG/3ML) 0.083% nebulizer solution Take 3 mLs (2.5 mg total) by nebulization every 4 (four) hours as needed for wheezing or shortness of breath. 11/22/13   Kela Millin, MD  antiseptic oral rinse (BIOTENE) LIQD 15 mLs by Mouth Rinse route 2 times daily at 12 noon and 4 pm. 11/22/13   Kela Millin, MD  atorvastatin (LIPITOR) 20 MG tablet Take 1 tablet (20 mg total) by mouth daily at 6 PM. 10/07/13   Catarina Hartshorn, MD  beta carotene w/minerals (OCUVITE) tablet Take 1 tablet by mouth daily.    Historical Provider, MD  bisacodyl (DULCOLAX) 10 MG suppository Place 1 suppository (10 mg total) rectally daily as needed for moderate constipation. 11/22/13   Kela Millin, MD  Cholecalciferol (VITAMIN D) 2000 UNITS tablet Take 2,000 Units by mouth daily.    Historical Provider, MD  diltiazem (CARDIZEM CD) 180 MG 24 hr capsule Take 180 mg by mouth daily.    Historical Provider, MD  doxazosin (CARDURA) 2 MG tablet Take 1 mg by mouth  at bedtime.    Historical Provider, MD  feeding supplement, RESOURCE BREEZE, (RESOURCE BREEZE) LIQD Take 1 Container by mouth daily at 6 PM. 11/22/13   Adeline C Viyuoh, MD  ferrous sulfate 325 (65 FE) MG tablet Take 325 mg by mouth daily.    Historical Provider, MD  fluticasone (FLONASE) 50 MCG/ACT nasal spray Place 2 sprays into the nose daily as needed. For allergies 11/13/11   Elliot Cousin, MD  hydrALAZINE (APRESOLINE) 25 MG tablet Take 1 tablet (25 mg total) by mouth every 8 (eight) hours. 11/22/13   Kela Millin, MD  hydroxypropyl methylcellulose (ISOPTO TEARS) 2.5 % ophthalmic solution Place 1 drop into both eyes 3 (three) times daily as needed for dry eyes.    Historical Provider, MD  insulin aspart (NOVOLOG)  100 UNIT/ML injection Inject 0-5 Units into the skin at bedtime. 11/22/13   Kela Millin, MD  insulin aspart (NOVOLOG) 100 UNIT/ML injection Inject 0-9 Units into the skin 3 (three) times daily with meals. 11/22/13   Kela Millin, MD  metoprolol (LOPRESSOR) 50 MG tablet Take 50 mg by mouth 2 (two) times daily. 08/05/11   Christiane Ha, MD  nitroGLYCERIN (NITROLINGUAL) 0.4 MG/SPRAY spray Place 1 spray under the tongue every 5 (five) minutes x 3 doses as needed for chest pain (use as directed).    Historical Provider, MD  omeprazole (PRILOSEC) 40 MG capsule Take 40 mg by mouth daily as needed.     Historical Provider, MD  polyvinyl alcohol (LIQUIFILM TEARS) 1.4 % ophthalmic solution Place 1 drop into both eyes 3 (three) times daily as needed for dry eyes. 11/22/13   Kela Millin, MD  Saw Palmetto 450 MG CAPS Take 1 capsule by mouth daily.    Historical Provider, MD  senna (SENOKOT) 8.6 MG TABS tablet Take 2 tablets by mouth daily as needed for mild constipation.    Historical Provider, MD  torsemide (DEMADEX) 20 MG tablet Take 20 mg by mouth daily.    Historical Provider, MD  triamcinolone (KENALOG) 0.1 % paste Use as directed in the mouth or throat 2 (two) times daily. 11/22/13   Kela Millin, MD  vitamin B-12 (CYANOCOBALAMIN) 500 MCG tablet Take 500 mcg by mouth daily.    Historical Provider, MD  warfarin (COUMADIN) 2.5 MG tablet Take 1 tablet (2.5 mg total) by mouth daily at 6 PM. 11/22/13   Kela Millin, MD    Allergies:   Allergies  Allergen Reactions  . Lasix [Furosemide] Itching    Scalp broke out  . Bystolic [Nebivolol Hcl] Rash  . Plavix [Clopidogrel Bisulfate] Rash    Social History:  reports that he has never smoked. He has never used smokeless tobacco. He reports that he drinks alcohol. He reports that he does not use illicit drugs.  Family History: Family History  Problem Relation Age of Onset  . Anesthesia problems Neg Hx   . Hypotension Neg Hx   .  Malignant hyperthermia Neg Hx   . Pseudochol deficiency Neg Hx     Physical Exam: Filed Vitals:   11/22/13 1740 11/22/13 1800 11/22/13 1900 11/22/13 1956  BP: 116/66 112/67 122/69 122/69  Pulse: 72 74 64 77  Temp:    97.4 F (36.3 C)  TempSrc:    Oral  Resp: 22 24 23 20   SpO2: 97% 93% 98% 97%   General appearance: alert, cooperative and no distress  Chronically ill appearing Head: Normocephalic, without obvious abnormality, atraumatic Eyes: negative Nose: Nares  normal. Septum midline. Mucosa normal. No drainage or sinus tenderness. Neck: no JVD and supple, symmetrical, trachea midline Lungs: diminished breath sounds LLL Heart: regular rate and rhythm, S1, S2 normal, no murmur, click, rub or gallop Abdomen: soft, non-tender; bowel sounds normal; no masses,  no organomegaly Extremities: extremities normal, atraumatic, no cyanosis or edema Pulses: 2+ and symmetric Skin: Skin color, texture, turgor normal. No rashes or lesions Neurologic: Grossly normal    Labs on Admission:   Recent Labs  11/22/13 0523 11/22/13 1557  NA 143 142  K 4.3 4.0  CL 106 102  CO2 22 22  GLUCOSE 130* 129*  BUN 122* 122*  CREATININE 3.70* 3.68*  CALCIUM 9.1 9.7    Recent Labs  11/20/13 0515 11/22/13 1557  WBC 8.1 9.2  NEUTROABS  --  7.4  HGB 8.3* 8.8*  HCT 25.4* 26.8*  MCV 91.4 93.1  PLT 359 403*    Recent Labs  11/22/13 1557  TROPONINI <0.30    Radiological Exams on Admission: Dg Chest 1 View  11/22/2013   CLINICAL DATA:  Chest pain  EXAM: CHEST - 1 VIEW  COMPARISON:  Prior chest x-ray 11/09/2013  FINDINGS: Compared to the prior chest x-ray, the patient has been extubated and the nasogastric tube removed. Stable appearance of left subclavian approach cardiac rhythm maintenance device with leads projecting over the right atrium and right ventricle. Interval development of a moderately large layering left pleural effusion and associated left basilar opacity. Probable small right  pleural effusion as well. Stable cardiomegaly and mediastinal contours. Atherosclerotic and mildly ectatic thoracic aorta. No pneumothorax or edema. No acute osseous abnormality.  IMPRESSION: 1. Interval development of a moderately large layering left pleural effusion and associated left basilar atelectasis versus infiltrate. 2. Probable small right pleural effusion and associated atelectasis without significant interval change.   Electronically Signed   By: Malachy MoanHeath  McCullough M.D.   On: 11/22/2013 17:40   Assessment/Plan 80 yo male with new left pleural effusion likely due to diastolic chf despite diuresis  Principal Problem:   CHF (congestive heart failure) with pleural effusion.  Cont current diuretics, only other possible help with his sob is for thoracentesis.  Family and pt agrees to this.  Have ordered.  His overall prognosis is quite poor.  Not dialysis candidate.  Hold coumadin tonight for possible procedure tomorrow.  Active Problems:   HTN (hypertension)   CKD (chronic kidney disease), stage III   CAD (coronary artery disease)   AAA (abdominal aortic aneurysm)   Pacemaker - Medtronic adapta dual-chamber implanted 2010   Myocardial infarction   Pleural effusion  Pt is DNR  Serayah Yazdani A 11/22/2013, 7:58 PM

## 2013-11-22 NOTE — Progress Notes (Signed)
ANTICOAGULATION CONSULT NOTE - Follow Up Consult  Pharmacy Consult for Coumadin Indication: atrial fibrillation  Allergies  Allergen Reactions  . Lasix [Furosemide] Itching    Scalp broke out  . Bystolic [Nebivolol Hcl] Rash  . Plavix [Clopidogrel Bisulfate] Rash    Recent Labs  11/20/13 0515 11/21/13 0529 11/22/13 0523  HGB 8.3*  --   --   HCT 25.4*  --   --   PLT 359  --   --   LABPROT 14.6 14.5 16.0*  INR 1.16 1.15 1.31  CREATININE 4.09* 3.87* 3.70*    Estimated Creatinine Clearance: 14 ml/min (by C-G formula based on Cr of 3.7).   Assessment: 80 yo male with multiple medical problems, admitted 1/12 to CCM for intubation from PNA/NSTEMI. He was taking Aggrenox PTA s/p CVA, but now converting to warfarin due to afib. Family in agreement with anticoag despite possible transition to palliative care.   INR still subtherapeutic but now rising after warfarin 2.5mg  x 3 doses  Hgb not checked today, no bleeding. CHAD2DS2-VASc is 7 (9.6% stroke risk per year).   Goal of Therapy:  INR 2-3 Monitor platelets by anticoagulation protocol: Yes   Plan:   Repeat Coumadin 2.5mg  po x 1 tonight  Daily PT/INR  Continue sq heparin until INR nearing therapeutic  And at this point, would recommend 2.5mg  daily if/when discharged to [facility]   Hessie Knows, PharmD, BCPS Pager [phone] 11/22/2013 8:47 AM

## 2013-11-22 NOTE — ED Notes (Signed)
Pt discharged from [facility] to [facility]. Transport team states when they arrived at the [facility] pt started to complain of chest pain. [facility] refused to accept pt and he was brought here for eval. Pt denies chest pain. States his throat hurts

## 2013-11-22 NOTE — Clinical Social Work Placement (Signed)
     Clinical Social Work Department CLINICAL SOCIAL WORK PLACEMENT NOTE 11/22/2013  Patient:  [name], [name]  Account Number:  [id number] Admit date:  11/07/2013  Clinical Social Worker:  Doroteo Glassman  Date/time:  11/12/2013 12:23 PM  Clinical Social Work is seeking post-discharge placement for this patient at the following level of care:   SKILLED NURSING   (*CSW will update this form in Epic as items are completed)   11/22/2013  Patient/family provided with [facility] Department of Clinical Social Works list of facilities offering this level of care within the geographic area requested by the patient (or if unable, by the patients family).  11/12/2013  Patient/family informed of their freedom to choose among providers that offer the needed level of care, that participate in Medicare, Medicaid or managed care program needed by the patient, have an available bed and are willing to accept the patient.  11/12/2013  Patient/family informed of [facility] ownership interest in [facility], as well as of the fact that they are under no obligation to receive care at this facility.  PASARR submitted to EDS on 11/22/2013 PASARR number received from EDS on 11/22/2013  FL2 transmitted to all facilities in geographic area requested by pt/family on  11/12/2013 FL2 transmitted to all facilities within larger geographic area on   Patient informed that his/her managed care company has contracts with or will negotiate with  certain facilities, including the following:     Patient/family informed of bed offers received:  11/22/2013 Patient chooses bed at [facility] Physician recommends and patient chooses bed at    Patient to be transferred to [facility] on  11/22/2013 Patient to be transferred to facility by [facility]  The following physician request were entered in Epic:   Additional Comments:

## 2013-11-22 NOTE — Progress Notes (Signed)
Pt discharged to [facility]. Report called to Harlene Ramus, RN.

## 2013-11-22 NOTE — ED Provider Notes (Signed)
CSN: [id number]     Arrival date & time 11/22/13  1439 History   First MD Initiated Contact with Patient 11/22/13 1451     Chief Complaint  Patient presents with  . Chest Pain   (Consider location/radiation/quality/duration/timing/severity/associated sxs/prior Treatment) HPI Patient was admitted January 12 for acute respiratory distress. He was hospitalized and was discharged this morning. During his hospital admission he developed renal insufficiency/failure. He was on a ventilator for several days. He also went into some congestive heart failure and had diuresis. He also had a non-STEMI that was treated by cardiology. During his hospital stay he was started in palliative care. He was felt stable for discharge this morning. Patient was discharged from the hospital this afternoon and was on his way to the pain center for admission for rehabilitation. Patient reports he started getting a upper chest/throat discomfort while he was in the ambulance being transported. He states he was not given any medication. He states he's had this discomfort before that his wife gave him nitroglycerin spray and it helps. He denies shortness of breath, diaphoresis, nausea, vomiting. He states this discomfort radiates into both his elbows. He can only describe it as a "steady" pain.  PCP Dr Phillips Odor  Past Medical History  Diagnosis Date  . Coronary artery disease   . Hypertension   . Pacemaker 05/08/2009    [employer]  . CKD (chronic kidney disease), stage III   . Complete heart block   . GERD (gastroesophageal reflux disease)   . Diastolic heart failure   . Myocardial infarction 2010  . Stroke 1998, 2005, 2009  . Shingles   . CHF (congestive heart failure)     diastolic  . COPD (chronic obstructive pulmonary disease)   . Arthritis   . Left-sided weakness   . Peripheral edema   . AAA (abdominal aortic aneurysm)     infrarenal 03/12/12- 3.7 x 3.5 cm   Past Surgical History  Procedure Laterality Date  .  Eye surgery      tear duct probing  . Permanent pacemaker insertion  05/08/2009    [employer]  . Hernia repair  10 yrs ago    [facility]  . Cataract extraction w/phaco  02/03/2012    Procedure: CATARACT EXTRACTION PHACO AND INTRAOCULAR LENS PLACEMENT (IOC);  Surgeon: Loraine Leriche T. Nile Pickrell, MD;  Location: [facility];  Service: Ophthalmology;  Laterality: Left;  CDE=29.12  . Cataract extraction w/phaco  03/23/2012    Procedure: CATARACT EXTRACTION PHACO AND INTRAOCULAR LENS PLACEMENT (IOC);  Surgeon: Loraine Leriche T. Nile Retana, MD;  Location: [facility];  Service: Ophthalmology;  Laterality: Right;  CDE 22.98  . Nm myocar perf wall motion  06/13/2009    mild to mod ischemia basal inferior & mid inferior regions   Family History  Problem Relation Age of Onset  . Anesthesia problems Neg Hx   . Hypotension Neg Hx   . Malignant hyperthermia Neg Hx   . Pseudochol deficiency Neg Hx    History  Substance Use Topics  . Smoking status: Never Smoker   . Smokeless tobacco: Never Used  . Alcohol Use: Yes     Comment: once a week  Going to NH  Review of Systems  All other systems reviewed and are negative.    Allergies  Lasix; Bystolic; and Plavix  Home Medications   Current Outpatient Rx  Name  Route  Sig  Dispense  Refill  . albuterol (PROVENTIL) (2.5 MG/3ML) 0.083% nebulizer solution   Nebulization   Take 3 mLs (2.5  mg total) by nebulization every 4 (four) hours as needed for wheezing or shortness of breath.   75 mL   12   . antiseptic oral rinse (BIOTENE) LIQD   Mouth Rinse   15 mLs by Mouth Rinse route 2 times daily at 12 noon and 4 pm.         . atorvastatin (LIPITOR) 20 MG tablet   Oral   Take 1 tablet (20 mg total) by mouth daily at 6 PM.   30 tablet   1   . beta carotene w/minerals (OCUVITE) tablet   Oral   Take 1 tablet by mouth daily.         . bisacodyl (DULCOLAX) 10 MG suppository   Rectal   Place 1 suppository (10 mg total) rectally daily as needed for moderate  constipation.   12 suppository   0   . Cholecalciferol (VITAMIN D) 2000 UNITS tablet   Oral   Take 2,000 Units by mouth daily.         Marland Kitchen. diltiazem (CARDIZEM CD) 180 MG 24 hr capsule   Oral   Take 180 mg by mouth daily.         Marland Kitchen. doxazosin (CARDURA) 2 MG tablet   Oral   Take 1 mg by mouth at bedtime.         . feeding supplement, RESOURCE BREEZE, (RESOURCE BREEZE) LIQD   Oral   Take 1 Container by mouth daily at 6 PM.      0   . ferrous sulfate 325 (65 FE) MG tablet   Oral   Take 325 mg by mouth daily.         . fluticasone (FLONASE) 50 MCG/ACT nasal spray   Nasal   Place 2 sprays into the nose daily as needed. For allergies         . hydrALAZINE (APRESOLINE) 25 MG tablet   Oral   Take 1 tablet (25 mg total) by mouth every 8 (eight) hours.         . hydroxypropyl methylcellulose (ISOPTO TEARS) 2.5 % ophthalmic solution   Both Eyes   Place 1 drop into both eyes 3 (three) times daily as needed for dry eyes.         . insulin aspart (NOVOLOG) 100 UNIT/ML injection   Subcutaneous   Inject 0-5 Units into the skin at bedtime.   10 mL   11     Continue sliding scale insulin   . insulin aspart (NOVOLOG) 100 UNIT/ML injection   Subcutaneous   Inject 0-9 Units into the skin 3 (three) times daily with meals.   10 mL   11     Continue sliding scale insulin   . metoprolol (LOPRESSOR) 50 MG tablet   Oral   Take 50 mg by mouth 2 (two) times daily.         . nitroGLYCERIN (NITROLINGUAL) 0.4 MG/SPRAY spray   Sublingual   Place 1 spray under the tongue every 5 (five) minutes x 3 doses as needed for chest pain (use as directed).         Marland Kitchen. omeprazole (PRILOSEC) 40 MG capsule   Oral   Take 40 mg by mouth daily as needed.          . polyvinyl alcohol (LIQUIFILM TEARS) 1.4 % ophthalmic solution   Both Eyes   Place 1 drop into both eyes 3 (three) times daily as needed for dry eyes.   15 mL   0   .  Saw Palmetto 450 MG CAPS   Oral   Take 1 capsule by  mouth daily.         Marland Kitchen senna (SENOKOT) 8.6 MG TABS tablet   Oral   Take 2 tablets by mouth daily as needed for mild constipation.         . torsemide (DEMADEX) 20 MG tablet   Oral   Take 20 mg by mouth daily.         Marland Kitchen triamcinolone (KENALOG) 0.1 % paste   Mouth/Throat   Use as directed in the mouth or throat 2 (two) times daily.   5 g   12   . vitamin B-12 (CYANOCOBALAMIN) 500 MCG tablet   Oral   Take 500 mcg by mouth daily.         Marland Kitchen warfarin (COUMADIN) 2.5 MG tablet   Oral   Take 1 tablet (2.5 mg total) by mouth daily at 6 PM.           PT/INR daily and coumadin dose adjustment pt SN[facility] M ...    BP 109/62  Pulse 63  Temp(Src) 97.6 F (36.4 C) (Oral)  Resp 18  SpO2 98%  Vital signs normal   Physical Exam  Nursing note and vitals reviewed. Constitutional: He appears well-developed and well-nourished.  Non-toxic appearance. He does not appear ill. No distress.  HENT:  Head: Normocephalic and atraumatic.  Right Ear: External ear normal.  Left Ear: External ear normal.  Nose: Nose normal. No mucosal edema or rhinorrhea.  Mouth/Throat: Oropharynx is clear and moist and mucous membranes are normal. No dental abscesses or uvula swelling.  Eyes: Conjunctivae and EOM are normal. Pupils are equal, round, and reactive to light.  Neck: Normal range of motion and full passive range of motion without pain. Neck supple.  Cardiovascular: Normal rate, regular rhythm and normal heart sounds.  Exam reveals no gallop and no friction rub.   No murmur heard. Pulmonary/Chest: Effort normal and breath sounds normal. No respiratory distress. He has no wheezes. He has no rhonchi. He has no rales. He exhibits no tenderness and no crepitus.  Abdominal: Soft. Normal appearance and bowel sounds are normal. He exhibits no distension. There is no tenderness. There is no rebound and no guarding.  Musculoskeletal: Normal range of motion. He exhibits no edema and no tenderness.  Moves all  extremities well.   Neurological: He is alert. He has normal strength. No cranial nerve deficit.  Skin: Skin is warm, dry and intact. No rash noted. No erythema. No pallor.  Psychiatric: He has a normal mood and affect. His speech is normal and behavior is normal. His mood appears not anxious.    ED Course  Procedures (including critical care time)  Medications  bumetanide (BUMEX) injection 1 mg (not administered)  acetaminophen (TYLENOL) tablet 650 mg (not administered)  ondansetron (ZOFRAN) injection 4 mg (not administered)  aspirin chewable tablet 324 mg (324 mg Oral Given 11/22/13 1616)  nitroGLYCERIN (NITROGLYN) 2 % ointment 1 inch (1 inch Topical Given 11/22/13 1733)   Review of patient's prior chest x-ray which was almost 2 weeks ago shows he still had the ET tube in place. The new left pleural effusion is very large compared to that chest x-ray. He has a stable anemia and stable renal insufficiency however he has a new marked elevation of his BNP. Patient had acute renal failure while in the hospital and was followed closely by nephrology and was diuresed for his congestive heart failure. He  also was followed by cardiology for his non-STEMI. His troponin tonight is finally back to normal. Patient was seen by palliative care however he was not a comfort care patient.  Patient relates his discomfort was improved after the above medication. He was started on Bumex for his congestive heart failure.  19:34 Dr Onalee Hua, admit to tele    Labs Review Results for orders placed during the hospital encounter of 11/22/13  TROPONIN I      Result Value Range   Troponin I <0.30  <0.30 ng/mL  CBC WITH DIFFERENTIAL      Result Value Range   WBC 9.2  4.0 - 10.5 K/uL   RBC 2.88 (*) 4.22 - 5.81 MIL/uL   Hemoglobin 8.8 (*) 13.0 - 17.0 g/dL   HCT 21.3 (*) 08.6 - 57.8 %   MCV 93.1  78.0 - 100.0 fL   MCH 30.6  26.0 - 34.0 pg   MCHC 32.8  30.0 - 36.0 g/dL   RDW 46.9  62.9 - 52.8 %   Platelets 403  (*) 150 - 400 K/uL   Neutrophils Relative % 81 (*) 43 - 77 %   Neutro Abs 7.4  1.7 - 7.7 K/uL   Lymphocytes Relative 8 (*) 12 - 46 %   Lymphs Abs 0.7  0.7 - 4.0 K/uL   Monocytes Relative 11  3 - 12 %   Monocytes Absolute 1.0  0.1 - 1.0 K/uL   Eosinophils Relative 0  0 - 5 %   Eosinophils Absolute 0.0  0.0 - 0.7 K/uL   Basophils Relative 0  0 - 1 %   Basophils Absolute 0.0  0.0 - 0.1 K/uL  BASIC METABOLIC PANEL      Result Value Range   Sodium 142  137 - 147 mEq/L   Potassium 4.0  3.7 - 5.3 mEq/L   Chloride 102  96 - 112 mEq/L   CO2 22  19 - 32 mEq/L   Glucose, Bld 129 (*) 70 - 99 mg/dL   BUN 413 (*) 6 - 23 mg/dL   Creatinine, Ser 2.44 (*) 0.50 - 1.35 mg/dL   Calcium 9.7  8.4 - 01.0 mg/dL   GFR calc non Af Amer 13 (*) >90 mL/min   GFR calc Af Amer 15 (*) >90 mL/min  PRO B NATRIURETIC PEPTIDE      Result Value Range   Pro B Natriuretic peptide (BNP) 27104.0 (*) 0 - 450 pg/mL    Laboratory interpretation all normal except stable renal insufficiency, stable anemia, marked elevation of his BNP compared to previous   Imaging Review Dg Chest 1 View  11/22/2013   CLINICAL DATA:  Chest pain  EXAM: CHEST - 1 VIEW  COMPARISON:  Prior chest x-ray 11/09/2013  FINDINGS: Compared to the prior chest x-ray, the patient has been extubated and the nasogastric tube removed. Stable appearance of left subclavian approach cardiac rhythm maintenance device with leads projecting over the right atrium and right ventricle. Interval development of a moderately large layering left pleural effusion and associated left basilar opacity. Probable small right pleural effusion as well. Stable cardiomegaly and mediastinal contours. Atherosclerotic and mildly ectatic thoracic aorta. No pneumothorax or edema. No acute osseous abnormality.  IMPRESSION: 1. Interval development of a moderately large layering left pleural effusion and associated left basilar atelectasis versus infiltrate. 2. Probable small right pleural  effusion and associated atelectasis without significant interval change.   Electronically Signed   By: Malachy Moan M.D.   On: 11/22/2013 17:40  EKG Interpretation    Date/Time:  Tuesday November 22 2013 14:52:09 EST Ventricular Rate:  68 PR Interval:    QRS Duration: 102 QT Interval:  384 QTC Calculation: 408 R Axis:   -50 Text Interpretation:  Accelerated Junctional rhythm with frequent ventricular-paced complexes Incomplete right bundle branch block Left anterior fascicular block Nonspecific ST and T wave abnormality When compared with ECG of 07-Nov-2013 12:26, Electronic ventricular pacemaker has replaced Electronic atrial pacemaker Confirmed by Manie Bealer  MD-I, Rosealee Recinos ([id number]) on 11/22/2013 5:35:59 PM            MDM   1. Pleural effusion   2. CHF (congestive heart failure)   3. Renal failure   4. Anemia   5. Atypical chest pain   6. AAA (abdominal aortic aneurysm)   7. CAD (coronary artery disease)   8. Chronic diastolic CHF (congestive heart failure)   9. CKD (chronic kidney disease) stage 4, GFR 15-29 ml/min     Plan admission   Devoria Albe, MD, Franz Dell, MD 11/22/13 2219

## 2013-11-22 NOTE — Discharge Summary (Signed)
Physician Discharge Summary  [name]:[id number] DOB: 22-Aug-1919 DOA: 11/07/2013  PCP: Colette Ribas, MD  Admit date: 11/07/2013 Discharge date: 11/22/2013  Time spent: >30 minutes  Recommendations for Outpatient Follow-up:  Follow-up Information   Please follow up. (SNF MD in 1-2days)       Please follow up. (Palliative care to follow at SNF)       Follow up with [name], MD. (as needed, call for appt)    Specialty:  Cardiology   Contact information:   [address] [place] [address] [phone]       Please follow up. (Renal/nephrology, call for appt upon discharge)        Discharge Diagnoses:  Active Problems:   CVA (cerebral infarction)   HTN (hypertension)   CKD (chronic kidney disease), stage III   CAD (coronary artery disease)   AAA (abdominal aortic aneurysm)   Pacemaker - Medtronic adapta dual-chamber implanted 2010   Acute respiratory distress   CHF (congestive heart failure)   Acute respiratory failure   Palliative care status   Discharge Condition: Improved/stable  Diet recommendation: Renal diet  Filed Weights   11/20/13 0500 11/21/13 0547 11/22/13 0440  Weight: 87.5 kg (192 lb 14.4 oz) 87.9 kg (193 lb 12.6 oz) 87.2 kg (192 lb 3.9 oz)    History of present illness:  80 y.o. Male admitted with h/o diastolic heart failure, coronary artery disease s/p PPM, 3.7 cm (april 2012 ) abdominal aortic aneurysm, recent adm 09/2013 for extension of his old right CVA -aspirin changed to aggrenox (allergy to plavix) initially  to APED in respiratory distress & hypoxic, poor mental status, required intubation, CXR shows R infiltrates. He was transferred to [facility] and admitted to [facility] service.He was found to have NSTEMI and cards consulted and followed and managed medically for NSTEMI due to CKD 4. He was subsequently extubated and transferred to [facility].  Unfortunately through this hospitalization, his kidney function  has being declining, renal followed, he was diuresed and managed supportively>>but not a dialysis candidate and Palliative consulted for goals of care.    Hospital Course:  1. Acute Hypoxic resp failure -due to aspiration pneumonia and CHF/NSTEMI  -s/p VDRF, as discussed above patient was intubated on presentation transferred to [facility] service at [facility], managed on vent per CCM till  extubated 1/14 - He was volume overloaded and was diuresed as appropriate and followed and placed on Demadex 20 daily which she is to continue on discharge  -on follow up his O2 weaned  -He was also treated with bronchodilators and is to continue when necessary nebs  2. NSTEMI/Pulm edema/cardio-renal syndrome  -Cardiology was consulted and saw patient and recommended med management only for NSTEMI due to advanced age/CKD  -pt was also volume overloaded, diuresed with IV Lasix initially and followup change to [facility] she is to continue upon discharge. -An echocardiogram was done EF 55-60%  -He was maintained on beta blocker,statin, Aggrenox initially but this was [facility]  1/24 following goals of care and pt placed on Coumadin as per cardiology recommendations if family wanting aggressive care>> as patient's/family's desire is to treat all that can be treated to get him better.   3. Aspiration pneumonia  -completed 7 days of abx  4. ARF on CKD 4  -suspect ATN/cardiorenal syndrome, renal was consulted and followed patient in the hospital  -urine output fair  -he was being gently hydrated for 2 days, without improvement in creatinine  -  Renal followed and he was diuresed as appropriate-he was volume overloaded as above but diuresis limited by his poor renal function and him not been a dialysis candidate as per renal -he is not a dialysis candidate as ready mentioned, Dr Jomarie LongsJoseph called and explained this to wife on 1/19.  -I discussed with Renal on 1/22 and as above renal recommends palliative consultation fo  goals-discussed with patient and I have consulted palliative  -Creatinine has gradually been trending down and today 3.7, patient is to follow up outpatient with renal. -Also per patient to followup with palliative care outpatient as per goals of care. 5. Afib, HR improved  -continue diltiazem and metoprolol, his rate has been controlled on this  -has been on aggrenox started per Neuro last month after CVA but per cardiology patient will need long-term treatment if aggressive treatments desired.  -As discussed above patient was placed on Coumadin on 1/24 cardiology recommendations and Aggrenox was dc'ed 1/24. Avoiding NOAC in this patient>65 with very poor renal function  -Continue Coumadin INR 1.31, and his to continue Coumadin and daily INR recommended nursing facility till INR is therapeutic-further Coumadin dosing per SNF M.D.  6. DM  -stable, SSI  7. H/o CVA  - as above and continue Coumadin, Fu with outpatient with neuro as needed 8.Anemia  -likely chronic disease associated with his CKD  -hgb 8.3 s/p transfusion of PRBC on 1/24  -Patient also given IV iron/Feraheme today her to discharge as per renal recommendations. 9. Constipation  -Continue current bowel regimen with Senokot, and Dulcolax when necessary     Procedures:  Echocardiogram Study Conclusions  - Left ventricle: The cavity size was normal. Wall thickness was increased in a pattern of moderate LVH. Systolic function was normal. The estimated ejection fraction was in the range of 55% to 60%. There is hypokinesis of the apical myocardium. Doppler parameters are consistent with abnormal left ventricular relaxation (grade 1 diastolic dysfunction). Doppler parameters are consistent with high ventricular filling pressure. - Aortic valve: Valve mobility was restricted. There was mild stenosis. Trivial regurgitation. - Mitral valve: Calcified annulus. Mild regurgitation. - Left atrium: The atrium was moderately  dilated. - Right atrium: The atrium was mildly dilated. - Pulmonary arteries: Systolic pressure was mildly to moderately increased. PA peak pressure: 50mm Hg (S). - Pericardium, extracardiac: There was a left pleural effusion. Impressions:  - Compared to study of 10/06/13, there appears to be mild apical hypokinesis (? related to pacing); otherwise, findings are similar.  LINES / TUBES:  ETT 1/12 >>1/14   Consultations: Cards  Renal  Palliative care   Discharge Exam: Filed Vitals:   11/22/13 0440  BP: 127/82  Pulse: 68  Temp: 97.9 F (36.6 C)  Resp: 16   Exam:  General: alert, oriented to self, place, in NAD  Cardiovascular: S1S2/RRR  Respiratory: Decreased breath sounds at bases, no wheezes  Abdomen: soft, Nt, BS present  Musculoskeletal: No edema, no cyanosis   Discharge Instructions  Discharge Orders   Future Appointments Provider Department Dept Phone   01/12/2014 10:00 AM [facility] [phone]   Future Orders Complete By Expires   Diet renal 60/70-11-28-1198  As directed    Increase activity slowly  As directed        Medication List    STOP taking these medications       dipyridamole-aspirin 200-25 MG per 12 hr capsule  Commonly known as:  AGGRENOX     ibuprofen 600 MG tablet  Commonly known as:  ADVIL,MOTRIN      TAKE these medications       albuterol (2.5 MG/3ML) 0.083% nebulizer solution  Commonly known as:  PROVENTIL  Take 3 mLs (2.5 mg total) by nebulization every 4 (four) hours as needed for wheezing or shortness of breath.     antiseptic oral rinse Liqd  15 mLs by Mouth Rinse route 2 times daily at 12 noon and 4 pm.     atorvastatin 20 MG tablet  Commonly known as:  LIPITOR  Take 1 tablet (20 mg total) by mouth daily at 6 PM.     beta carotene w/minerals tablet  Take 1 tablet by mouth daily.     bisacodyl 10 MG suppository  Commonly known as:  DULCOLAX  Place 1 suppository (10 mg  total) rectally daily as needed for moderate constipation.     diltiazem 180 MG 24 hr capsule  Commonly known as:  CARDIZEM CD  Take 180 mg by mouth daily.     doxazosin 2 MG tablet  Commonly known as:  CARDURA  Take 1 mg by mouth at bedtime.     feeding supplement (RESOURCE BREEZE) Liqd  Take 1 Container by mouth daily at 6 PM.     ferrous sulfate 325 (65 FE) MG tablet  Take 325 mg by mouth daily.     fluticasone 50 MCG/ACT nasal spray  Commonly known as:  FLONASE  Place 2 sprays into the nose daily as needed. For allergies     hydrALAZINE 25 MG tablet  Commonly known as:  APRESOLINE  Take 1 tablet (25 mg total) by mouth every 8 (eight) hours.     hydroxypropyl methylcellulose 2.5 % ophthalmic solution  Commonly known as:  ISOPTO TEARS  Place 1 drop into both eyes 3 (three) times daily as needed for dry eyes.     insulin aspart 100 UNIT/ML injection  Commonly known as:  novoLOG  Inject 0-5 Units into the skin at bedtime.     insulin aspart 100 UNIT/ML injection  Commonly known as:  novoLOG  Inject 0-9 Units into the skin 3 (three) times daily with meals.     metoprolol 50 MG tablet  Commonly known as:  LOPRESSOR  Take 50 mg by mouth 2 (two) times daily.     nitroGLYCERIN 0.4 MG/SPRAY spray  Commonly known as:  NITROLINGUAL  Place 1 spray under the tongue every 5 (five) minutes x 3 doses as needed for chest pain (use as directed).     omeprazole 40 MG capsule  Commonly known as:  PRILOSEC  Take 40 mg by mouth daily as needed.     polyvinyl alcohol 1.4 % ophthalmic solution  Commonly known as:  LIQUIFILM TEARS  Place 1 drop into both eyes 3 (three) times daily as needed for dry eyes.     Saw Palmetto 450 MG Caps  Take 1 capsule by mouth daily.     senna 8.6 MG Tabs tablet  Commonly known as:  SENOKOT  Take 2 tablets by mouth daily as needed for mild constipation.     torsemide 20 MG tablet  Commonly known as:  DEMADEX  Take 20 mg by mouth daily.      triamcinolone 0.1 % paste  Commonly known as:  KENALOG  Use as directed in the mouth or throat 2 (two) times daily.     vitamin B-12 500 MCG tablet  Commonly known as:  CYANOCOBALAMIN  Take 500 mcg by mouth daily.  Vitamin D 2000 UNITS tablet  Take 2,000 Units by mouth daily.     warfarin 2.5 MG tablet  Commonly known as:  COUMADIN  Take 1 tablet (2.5 mg total) by mouth daily at 6 PM.       Allergies  Allergen Reactions  . Lasix [Furosemide] Itching    Scalp broke out  . Bystolic [Nebivolol Hcl] Rash  . Plavix [Clopidogrel Bisulfate] Rash      The results of significant diagnostics from this hospitalization (including imaging, microbiology, ancillary and laboratory) are listed below for reference.    Significant Diagnostic Studies: Dg Esophagus  11/10/2013   CLINICAL DATA:  History of dysphagia and dysmotility. Evaluate for aspiration. Recent extubation.  EXAM: ESOPHOGRAM/BARIUM SWALLOW  TECHNIQUE: Single contrast examination was performed using  thin barium.  COMPARISON:  None.  FLUOROSCOPY TIME:  59 seconds  FINDINGS: The patient has limited mobility. Esophagus was initially evaluated in the RPO position with the head rotated towards the right, providing near lateral evaluation of the proximal esophagus. No laryngeal penetration or aspiration was observed.  Additional barium was administered. There is presbyesophagus with a decreased primary stripping wave. There is no evidence of focal ulceration or stricture. A 13 mm barium tablet was administered, and this passed without significant delay into the stomach.  IMPRESSION: 1. No evidence of laryngeal penetration or tracheal aspiration. 2. Presbyesophagus. 3. No mucosal ulceration or stricture identified.   Electronically Signed   By: Roxy Horseman M.D.   On: 11/10/2013 13:03   US Renal Port  11/14/2013   CLINICAL DATA:  Acute on chronic renal disease.  EXAM: RENAL/URINARY TRACT ULTRASOUND COMPLETE  COMPARISON:  Renal ultrasound  05/04/2009.  FINDINGS: Right Kidney:  Length: 9.5 cm. No stone, mass or hydronephrosis is identified. Increased cortical echogenicity is noted.  Left Kidney:  Length: 10.8 cm. No stone, mass or hydronephrosis is identified. Increased cortical echogenicity is noted.  Bladder:  Incompletely distended but otherwise unremarkable.  IMPRESSION: Negative for hydronephrosis or other acute abnormality.  Increased cortical echogenicity of the kidneys compatible with medical renal disease.   Electronically Signed   By: Drusilla Kanner M.D.   On: 11/14/2013 19:55   Dg Chest Port 1 View  11/09/2013   CLINICAL DATA:  Pneumonia versus CHF.  ETT positioning.  EXAM: PORTABLE CHEST - 1 VIEW  COMPARISON:  Chest x-ray from yesterday.  FINDINGS: Endotracheal tube ends in the mid thoracic trachea. An orogastric tube continues into the stomach.  Stable cardiomegaly. Distorted mediastinal contours at least partly due to rightward rotation. Hazy appearance of the lower chest suggest pleural fluid or atelectasis. The apical lungs argues against pulmonary edema.  IMPRESSION: 1. Endotracheal and orogastric tubes in good position. 2. Similar appearance of bibasilar opacities, pleural effusions or atelectasis at least. 3. Cardiomegaly and aortic tortuosity or enlargement. Evaluation of the mediastinum limited by rotation.   Electronically Signed   By: Tiburcio Pea M.D.   On: 11/09/2013 05:38   Dg Chest Port 1 View  11/08/2013   CLINICAL DATA:  Pneumonia.  EXAM: PORTABLE CHEST - 1 VIEW  COMPARISON:  11/07/2013.  FINDINGS: Endotracheal tube and NG tube in stable position. Cardiomegaly with normal pulmonary vascularity. Pacemaker noted in stable position. Pulmonary infiltrate on the right is improved slightly. Persistent infiltrate remains. Small pleural effusions and left base atelectasis appear to be present.  IMPRESSION: 1. Improving but persistent right lung infiltrate. Small bilateral pleural effusions and mild left base atelectasis.   2.  Stable line and tube  positions.   Electronically Signed   By: Maisie Fus  Register   On: 11/08/2013 07:09   Dg Chest Port 1 View  11/07/2013   CLINICAL DATA:  Respiratory distress, intubation.  EXAM: PORTABLE CHEST - 1 VIEW  COMPARISON:  Chest radiograph October 05, 2013  FINDINGS: Cardiac silhouette remains moderately enlarged, tortuous, mildly ectatic calcified aorta. Right greater than left lung patchy airspace opacities in a background of increasing interstitial prominence and new subsegmental atelectasis. No pleural effusions. No pneumothorax. Mild central pulmonary vasculature congestion.  Endotracheal tube tip projects 5.6 cm above the carina. Dual lead left cardiac pacemaker in situ. Multiple EKG lines overlie the patient and may obscure subtle underlying pathology. soft tissue planes and included osseous structures are nonsuspicious.  IMPRESSION: Stable cardiomegaly, interval increase atelectasis, interstitial and alveolar airspace opacities, which may reflect pulmonary edema or pneumonia.   Electronically Signed   By: Awilda Metro   On: 11/07/2013 05:23   Dg Abd Portable 1v  11/07/2013   CLINICAL DATA:  80 year old male status post NG tube placement. Initial encounter.  EXAM: PORTABLE ABDOMEN - 1 VIEW  COMPARISON:  Upper GI series 329 2006.  FINDINGS: Portable AP supine view at 1428 hrs. Enteric tube in place, side hole projects at the level of the distal thoracic esophagus or gastroesophageal junction. The tip of the tube is at the level of the gastric cardia. Visualized bowel gas pattern is non obstructed. Cardiac pacemaker leads partially visible. Degenerative changes in the spine.  IMPRESSION: NG tube tip at the proximal stomach. Advance 8 cm for more optimal placement.   Electronically Signed   By: Augusto Gamble M.D.   On: 11/07/2013 14:43    Microbiology: No results found for this or any previous visit (from the past 240 hour(s)).   Labs: Basic Metabolic Panel:  Recent Labs Lab  11/18/13 0445 11/19/13 0537 11/20/13 0515 11/21/13 0529 11/22/13 0523  NA 134* 135* 137 139 143  K 4.7 4.4 4.3 4.3 4.3  CL 96 98 100 100 106  CO2 21 20 21  18* 22  GLUCOSE 117* 122* 110* 118* 130*  BUN 105* 109* 118* 119* 122*  CREATININE 4.22* 4.02* 4.09* 3.87* 3.70*  CALCIUM 8.6 8.8 8.6 9.2 9.1   Liver Function Tests: No results found for this basename: AST, ALT, ALKPHOS, BILITOT, PROT, ALBUMIN,  in the last 168 hours No results found for this basename: LIPASE, AMYLASE,  in the last 168 hours No results found for this basename: AMMONIA,  in the last 168 hours CBC:  Recent Labs Lab 11/18/13 0705 11/20/13 0515  WBC 9.0 8.1  HGB 7.9* 8.3*  HCT 24.4* 25.4*  MCV 91.7 91.4  PLT 311 359   Cardiac Enzymes: No results found for this basename: CKTOTAL, CKMB, CKMBINDEX, TROPONINI,  in the last 168 hours BNP: BNP (last 3 results)  Recent Labs  10/05/13 1344 11/07/13 0444  PROBNP 3785.0* 8303.0*   CBG:  Recent Labs Lab 11/21/13 0725 11/21/13 1141 11/21/13 1703 11/21/13 2052 11/22/13 0738  GLUCAP 106* 125* 117* 147* 113*       Signed:  Kaelene Elliston C  [facility] 11/22/2013, 10:38 AM

## 2013-11-22 NOTE — Progress Notes (Addendum)
Patient cleared for discharge. Packet copied and placed in [place]. CSW called patient's wife, June, left voicemail informing of discharge to [facility]. Spoke with Kristin Bruins at [facility]. They are ready for patient at any time. Met with patient and patient's daughter at bedside. Informed of transfer. They are aware and agreeable to transfer to [place] center. They are glad patient is ready to go after lengthy hospital stay. ptar called for transportation.  Chazz Philson C. [place] MSW, [place]

## 2013-11-22 NOTE — ED Notes (Signed)
Late note:  Dr Onalee Hua in to see patient prior to transport to room 321

## 2013-11-23 ENCOUNTER — Inpatient Hospital Stay (HOSPITAL_COMMUNITY): Payer: Medicare Other

## 2013-11-23 ENCOUNTER — Encounter (HOSPITAL_COMMUNITY): Payer: Self-pay | Admitting: Internal Medicine

## 2013-11-23 DIAGNOSIS — I5033 Acute on chronic diastolic (congestive) heart failure: Principal | ICD-10-CM

## 2013-11-23 DIAGNOSIS — I10 Essential (primary) hypertension: Secondary | ICD-10-CM

## 2013-11-23 DIAGNOSIS — D649 Anemia, unspecified: Secondary | ICD-10-CM | POA: Diagnosis present

## 2013-11-23 DIAGNOSIS — I4891 Unspecified atrial fibrillation: Secondary | ICD-10-CM | POA: Diagnosis present

## 2013-11-23 DIAGNOSIS — J90 Pleural effusion, not elsewhere classified: Secondary | ICD-10-CM | POA: Diagnosis present

## 2013-11-23 DIAGNOSIS — E119 Type 2 diabetes mellitus without complications: Secondary | ICD-10-CM

## 2013-11-23 HISTORY — DX: Pleural effusion, not elsewhere classified: J90

## 2013-11-23 HISTORY — DX: Type 2 diabetes mellitus without complications: E11.9

## 2013-11-23 LAB — BASIC METABOLIC PANEL
BUN: 118 mg/dL — AB (ref 6–23)
CHLORIDE: 105 meq/L (ref 96–112)
CO2: 22 meq/L (ref 19–32)
Calcium: 9.7 mg/dL (ref 8.4–10.5)
Creatinine, Ser: 3.67 mg/dL — ABNORMAL HIGH (ref 0.50–1.35)
GFR calc Af Amer: 15 mL/min — ABNORMAL LOW (ref >90)
GFR calc non Af Amer: 13 mL/min — ABNORMAL LOW (ref >90)
Glucose, Bld: 113 mg/dL — ABNORMAL HIGH (ref 70–99)
Potassium: 4.1 mEq/L (ref 3.7–5.3)
Sodium: 143 mEq/L (ref 137–147)

## 2013-11-23 LAB — GLUCOSE, CAPILLARY
GLUCOSE-CAPILLARY: 104 mg/dL — AB (ref 70–99)
GLUCOSE-CAPILLARY: 157 mg/dL — AB (ref 70–99)
Glucose-Capillary: 134 mg/dL — ABNORMAL HIGH (ref 70–99)
Glucose-Capillary: 147 mg/dL — ABNORMAL HIGH (ref 70–99)

## 2013-11-23 LAB — TROPONIN I
Troponin I: <0.3 ng/mL (ref <0.30)
Troponin I: <0.3 ng/mL (ref <0.30)

## 2013-11-23 MED ORDER — POTASSIUM CHLORIDE CRYS ER 10 MEQ PO TBCR
10.0000 meq | EXTENDED_RELEASE_TABLET | Freq: Two times a day (BID) | ORAL | Status: DC
  Administered 2013-11-23 – 2013-11-24 (×3): 10 meq via ORAL
  Filled 2013-11-23 (×3): qty 1

## 2013-11-23 MED ORDER — TORSEMIDE 50 MG/5ML IV SOLN: 20.0000 mg | Freq: Two times a day (BID) | INTRAVENOUS | Status: DC

## 2013-11-23 MED ORDER — INSULIN ASPART 100 UNIT/ML ~~LOC~~ SOLN: 0.0000 [IU] | Freq: Three times a day (TID) | SUBCUTANEOUS | Status: DC

## 2013-11-23 MED ORDER — BUMETANIDE 0.25 MG/ML IJ SOLN
1.0000 mg | Freq: Two times a day (BID) | INTRAMUSCULAR | Status: DC
  Administered 2013-11-23 (×2): 1 mg via INTRAVENOUS
  Filled 2013-11-23 (×2): qty 4

## 2013-11-23 MED ORDER — INSULIN ASPART 100 UNIT/ML ~~LOC~~ SOLN: 0.0000 [IU] | Freq: Every day | SUBCUTANEOUS | Status: DC

## 2013-11-23 MED ORDER — WARFARIN SODIUM 5 MG PO TABS
5.0000 mg | ORAL_TABLET | Freq: Once | ORAL | Status: AC
  Administered 2013-11-23: 5 mg via ORAL
  Filled 2013-11-23: qty 1

## 2013-11-23 MED ORDER — WARFARIN - PHARMACIST DOSING INPATIENT: Status: DC

## 2013-11-23 NOTE — Progress Notes (Signed)
[facility] PROGRESS NOTE  JAJUAN EWALT XMI:[id number] DOB: 09-May-1919 DOA: 11/22/2013 PCP: Colette Ribas, MD   Code Status: DO NOT RESUSCITATE Family Communication: Family not available Disposition Plan: Discharge the skilled nursing facility when clinically appropriate.   Consultants:  None  Procedures:  Thoracentesis pending  Antibiotics:  None  HPI/Subjective:  This is a 80 year old man who was recently hospitalized at [facility] from 11/07/2013 through 11/22/2013 for treatment of acute hypoxic respiratory failure from a combination of aspiration pneumonia, diastolic heart failure, and non-ST elevation myocardial infarction. He was intubated and successfully extubated. Treatment of his heart failure was limited by his worsening renal function as cardio renal syndrome was suspected. He also developed paroxysmal atrial fibrillation and was therefore started on Coumadin. Both the cardiologist and nephrologist recommended medical management without escalation in care and without hemodialysis.. Palliative care consult was pursued. He is chronically bedbound. He is a DO NOT RESUSCITATE status. The patient returned on 11/22/2013 with chest pain and shortness of breath. His chest x-ray revealed worsening bilateral pleural effusions.  This morning, the patient denies shortness of breath or chest pain.  Objective: Filed Vitals:   11/23/13 0554  BP: 162/91  Pulse: 69  Temp: 97.4 F (36.3 C)  Resp: 20    Intake/Output Summary (Last 24 hours) at 11/23/13 0949 Last data filed at 11/23/13 0100  Gross per 24 hour  Intake      0 ml  Output    300 ml  Net   -300 ml   Filed Weights   11/22/13 2122 11/23/13 0500  Weight: 85.1 kg (187 lb 9.8 oz) 85.1 kg (187 lb 9.8 oz)    Exam:   General:  Debilitated 80 year old Caucasian man who appears ill, but in no acute distress.  Cardiovascular: Irregular, irregular.  Respiratory: Clear anteriorly with decreased breath  sounds in the bases. No audible wheezes or crackles currently. Breathing is nonlabored.  Abdomen: Positive bowel sounds, soft, nontender, nondistended.  Musculoskeletal: No acute hot joints.  Neuro/psychiatric: He is asleep, but becomes awake enough to answer questions appropriately. He is aware that he is in the hospital. He is oriented to himself. His speech is clear. He has a flat affect. Strength not assessed.   Data Reviewed: Basic Metabolic Panel:  Recent Labs Lab 11/20/13 0515 11/21/13 0529 11/22/13 0523 11/22/13 1557 11/23/13 0247  NA 137 139 143 142 143  K 4.3 4.3 4.3 4.0 4.1  CL 100 100 106 102 105  CO2 21 18* 22 22 22   GLUCOSE 110* 118* 130* 129* 113*  BUN 118* 119* 122* 122* 118*  CREATININE 4.09* 3.87* 3.70* 3.68* 3.67*  CALCIUM 8.6 9.2 9.1 9.7 9.7   Liver Function Tests: No results found for this basename: AST, ALT, ALKPHOS, BILITOT, PROT, ALBUMIN,  in the last 168 hours No results found for this basename: LIPASE, AMYLASE,  in the last 168 hours No results found for this basename: AMMONIA,  in the last 168 hours CBC:  Recent Labs Lab 11/18/13 0705 11/20/13 0515 11/22/13 1557  WBC 9.0 8.1 9.2  NEUTROABS  --   --  7.4  HGB 7.9* 8.3* 8.8*  HCT 24.4* 25.4* 26.8*  MCV 91.7 91.4 93.1  PLT 311 359 403*   Cardiac Enzymes:  Recent Labs Lab 11/22/13 1557 11/22/13 2152 11/23/13 0247 11/23/13 0858  TROPONINI <0.30 <0.30 <0.30 <0.30   BNP (last 3 results)  Recent Labs  10/05/13 1344 11/07/13 0444 11/22/13 1557  PROBNP 3785.0* 8303.0* 27104.0*   CBG:  Recent Labs Lab 11/21/13 2052 11/22/13 0738 11/22/13 1204 11/22/13 2305 11/23/13 0802  GLUCAP 147* 113* 114* 104* 104*    No results found for this or any previous visit (from the past 240 hour(s)).   Studies: Dg Chest 1 View  11/22/2013   CLINICAL DATA:  Chest pain  EXAM: CHEST - 1 VIEW  COMPARISON:  Prior chest x-ray 11/09/2013  FINDINGS: Compared to the prior chest x-ray, the patient has  been extubated and the nasogastric tube removed. Stable appearance of left subclavian approach cardiac rhythm maintenance device with leads projecting over the right atrium and right ventricle. Interval development of a moderately large layering left pleural effusion and associated left basilar opacity. Probable small right pleural effusion as well. Stable cardiomegaly and mediastinal contours. Atherosclerotic and mildly ectatic thoracic aorta. No pneumothorax or edema. No acute osseous abnormality.  IMPRESSION: 1. Interval development of a moderately large layering left pleural effusion and associated left basilar atelectasis versus infiltrate. 2. Probable small right pleural effusion and associated atelectasis without significant interval change.   Electronically Signed   By: Malachy Moan M.D.   On: 11/22/2013 17:40    Scheduled Meds: . antiseptic oral rinse  15 mL Mouth Rinse q12n4p  . atorvastatin  20 mg Oral q1800  . beta carotene w/minerals  1 tablet Oral Daily  . diltiazem  180 mg Oral Daily  . doxazosin  1 mg Oral QHS  . feeding supplement (RESOURCE BREEZE)  1 Container Oral q1800  . ferrous sulfate  325 mg Oral Daily  . hydrALAZINE  25 mg Oral Q8H  . insulin aspart  0-5 Units Subcutaneous QHS  . insulin aspart  0-9 Units Subcutaneous TID WC  . metoprolol  50 mg Oral BID  . pantoprazole  80 mg Oral Daily  . sodium chloride  3 mL Intravenous Q12H  . torsemide  20 mg Oral Daily  . triamcinolone   Mouth/Throat BID   Continuous Infusions:   Assessment:  Principal Problem:   Pleural effusion, bilateral Active Problems:   Chronic kidney disease (CKD), stage IV (severe)   Pacemaker - Medtronic adapta dual-chamber implanted 2010   Acute on chronic diastolic heart failure   Atrial fibrillation   CAD (coronary artery disease)   CVA (cerebral infarction)   HTN (hypertension)   AAA (abdominal aortic aneurysm)   Myocardial infarction   Anemia  1. Bilateral pleural effusions,  likely secondary to decompensated diastolic heart failure with superimposed chronic kidney disease. He is on torsemide as previously recommended by cardiology and nephrology during the previous hospitalization. It appeared that increasing the dosing of diuretic therapy worsened his renal function. He is not a dialysis candidate. Therefore, a thoracentesis has been ordered for treatment. We'll consider holding torsemide in starting IV Demadex, as his pro BNP is greater than 27,000.  Acute on chronic diastolic heart failure. As above.  Atrial fibrillation, recently diagnosed. Coumadin is on hold pending the thoracentesis. It will be restarted per pharmacy dosing. His rate is currently controlled on diltiazem and metoprolol.  Recent non-STEMI/CAD/hypertension. Continue Coumadin, metoprolol, diltiazem, and hydralazine.  Stage IV chronic kidney disease. His creatinine is currently stable and slightly improved compared to last week during the previous hospitalization.  Anemia, likely secondary to chronic kidney disease. His hemoglobin is currently stable. He was transfused one unit of packed red blood cells and given IV iron during the previous hospitalization.   Diabetes mellitus. Currently stable on sliding scale NovoLog.  Chronic debilitation. Apparently the patient is DO NOT  RESUSCITATE, but the family continues to want active treatment per Dr. Onalee Huaavid. I have not talked with the family yet. Apparently, the palliative care team was consulted during the previous hospitalization.     Plan: 1. Planned thoracentesis today. 2. Give torsemide IV and watch renal function. 3. Discuss with family when they arrive.  Time spent: 40 minutes.    [facility] Mccord Adolescent Treatment FacilityFISHER,Andre Swander  [facility] Pager [phone] If 7PM-7AM, please contact night-coverage at [email or web address], password [email or web address] 11/23/2013, 9:49 AM  LOS: 1 day

## 2013-11-23 NOTE — Clinical Social Work Psychosocial (Signed)
Clinical Social Work Department BRIEF PSYCHOSOCIAL ASSESSMENT 11/23/2013  Patient:  DEAIRE, MCWHIRTER     Account Number:  [id number]     Admit date:  11/22/2013  Clinical Social Worker:  Wyatt Haste  Date/Time:  11/23/2013 10:35 AM  Referred by:  CSW  Date Referred:  11/23/2013 Referred for  SNF Placement   Other Referral:   Interview type:  Patient Other interview type:    PSYCHOSOCIAL DATA Living Status:  WIFE Admitted from facility:   Level of care:   Primary support name:  June Primary support relationship to patient:  SPOUSE Degree of support available:   supportive    CURRENT CONCERNS Current Concerns  Post-Acute Placement   Other Concerns:    SOCIAL WORK ASSESSMENT / PLAN CSW met with pt at bedside. Pt alert and oriented. He was d/c from [facility] yesterday after an extended hospital stay to [facility] for rehab. Pt began experiencing chest pain and was brought to ED prior to completing admission paperwork at [facility] He reports that prior to hospitalization, pt was living with his wife. Their home is about 5 minutes away and [facility] is very convenient for them. Pt has been at [facility] in the past as well for rehab. Pt admitted with bilateral pleural effusions. He was seen by palliative care at [facility]. Notes indicate family wanted to give pt chance for rehabilitation first. He is a DNR. CSW spoke with Sondra Barges and Kerri at [facility] regarding pt. Okay to accept when stable. Will continue to keep them updated on pt's progress with his permission.   Assessment/plan status:  Psychosocial Support/Ongoing Assessment of Needs Other assessment/ plan:   Information/referral to community resources:   [facility]    PATIENT'S/FAMILY'S RESPONSE TO PLAN OF CARE: Pt agreeable to above plan and would like to transfer to [facility] for rehab when medically stable. CSW will continue to follow.       Benay Pike, [place]

## 2013-11-23 NOTE — Progress Notes (Signed)
Utilization Review Complete  

## 2013-11-23 NOTE — Progress Notes (Addendum)
ANTICOAGULATION CONSULT NOTE - Initial Consult  Pharmacy Consult for Coumadin Indication: atrial fibrillation  Allergies  Allergen Reactions  . Lasix [Furosemide] Itching    Scalp broke out  . Bystolic [Nebivolol Hcl] Rash  . Plavix [Clopidogrel Bisulfate] Rash    Patient Measurements: Height: 5\' 10"  (177.8 cm) Weight: 187 lb 9.8 oz (85.1 kg) IBW/kg (Calculated) : 73  Vital Signs: Temp: 97.4 F (36.3 C) (01/28 1444) Temp src: Oral (01/28 1444) BP: 135/75 mmHg (01/28 1549) Pulse Rate: 61 (01/28 1549)  Labs:  Recent Labs  11/21/13 0529 11/22/13 0523  11/22/13 1557 11/22/13 2152 11/23/13 0247 11/23/13 0858  HGB  --   --   --  8.8*  --   --   --   HCT  --   --   --  26.8*  --   --   --   PLT  --   --   --  403*  --   --   --   LABPROT 14.5 16.0*  --   --   --   --   --   INR 1.15 1.31  --   --   --   --   --   CREATININE 3.87* 3.70*  --  3.68*  --  3.67*  --   TROPONINI  --   --   < > <0.30 <0.30 <0.30 <0.30  < > = values in this interval not displayed.  Estimated Creatinine Clearance: 13.3 ml/min (by C-G formula based on Cr of 3.67).   Medical History: Past Medical History  Diagnosis Date  . Coronary artery disease   . Hypertension   . Pacemaker 05/08/2009    Medtronic  . CKD (chronic kidney disease), stage III   . Complete heart block   . GERD (gastroesophageal reflux disease)   . Diastolic heart failure   . Myocardial infarction 2010  . Stroke 1998, 2005, 2009  . Shingles   . CHF (congestive heart failure)     diastolic  . COPD (chronic obstructive pulmonary disease)   . Arthritis   . Left-sided weakness   . Peripheral edema   . AAA (abdominal aortic aneurysm)     infrarenal 03/12/12- 3.7 x 3.5 cm  . Anemia   . Atrial fibrillation   . Acute respiratory failure 11/07/2013    Ventilator dependent.  . Diabetes mellitus, type 2 11/23/2013    Medications:  Prescriptions prior to admission  Medication Sig Dispense Refill  . atorvastatin (LIPITOR) 20  MG tablet Take 1 tablet (20 mg total) by mouth daily at 6 PM.  30 tablet  1  . beta carotene w/minerals (OCUVITE) tablet Take 1 tablet by mouth daily.      . Cholecalciferol (VITAMIN D) 2000 UNITS tablet Take 2,000 Units by mouth daily.      Marland Kitchen. diltiazem (CARDIZEM CD) 180 MG 24 hr capsule Take 180 mg by mouth daily.      Marland Kitchen. doxazosin (CARDURA) 2 MG tablet Take 1 mg by mouth at bedtime.      . ferrous sulfate 325 (65 FE) MG tablet Take 325 mg by mouth daily.      . fluticasone (FLONASE) 50 MCG/ACT nasal spray Place 2 sprays into the nose daily as needed. For allergies      . hydroxypropyl methylcellulose (ISOPTO TEARS) 2.5 % ophthalmic solution Place 1 drop into both eyes 3 (three) times daily as needed for dry eyes.      . insulin aspart (NOVOLOG) 100 UNIT/ML injection  Inject 0-5 Units into the skin at bedtime.  10 mL  11  . metoprolol (LOPRESSOR) 50 MG tablet Take 50 mg by mouth 2 (two) times daily.      . nitroGLYCERIN (NITROLINGUAL) 0.4 MG/SPRAY spray Place 1 spray under the tongue every 5 (five) minutes x 3 doses as needed for chest pain (use as directed).      Marland Kitchen omeprazole (PRILOSEC) 40 MG capsule Take 40 mg by mouth daily as needed.       . Saw Palmetto 450 MG CAPS Take 1 capsule by mouth daily.      Marland Kitchen senna (SENOKOT) 8.6 MG TABS tablet Take 2 tablets by mouth daily as needed for mild constipation.      . torsemide (DEMADEX) 20 MG tablet Take 20 mg by mouth daily.      . vitamin B-12 (CYANOCOBALAMIN) 500 MCG tablet Take 500 mcg by mouth daily.      Marland Kitchen albuterol (PROVENTIL) (2.5 MG/3ML) 0.083% nebulizer solution Take 3 mLs (2.5 mg total) by nebulization every 4 (four) hours as needed for wheezing or shortness of breath.  75 mL  12  . antiseptic oral rinse (BIOTENE) LIQD 15 mLs by Mouth Rinse route 2 times daily at 12 noon and 4 pm.      . bisacodyl (DULCOLAX) 10 MG suppository Place 1 suppository (10 mg total) rectally daily as needed for moderate constipation.  12 suppository  0  . feeding  supplement, RESOURCE BREEZE, (RESOURCE BREEZE) LIQD Take 1 Container by mouth daily at 6 PM.    0  . hydrALAZINE (APRESOLINE) 25 MG tablet Take 1 tablet (25 mg total) by mouth every 8 (eight) hours.      . insulin aspart (NOVOLOG) 100 UNIT/ML injection Inject 0-9 Units into the skin 3 (three) times daily with meals.  10 mL  11  . polyvinyl alcohol (LIQUIFILM TEARS) 1.4 % ophthalmic solution Place 1 drop into both eyes 3 (three) times daily as needed for dry eyes.  15 mL  0  . triamcinolone (KENALOG) 0.1 % paste Use as directed in the mouth or throat 2 (two) times daily.  5 g  12  . warfarin (COUMADIN) 2.5 MG tablet Take 1 tablet (2.5 mg total) by mouth daily at 6 PM.       Scheduled:  . antiseptic oral rinse  15 mL Mouth Rinse q12n4p  . atorvastatin  20 mg Oral q1800  . beta carotene w/minerals  1 tablet Oral Daily  . bumetanide (BUMEX) IV  1 mg Intravenous Q12H  . diltiazem  180 mg Oral Daily  . doxazosin  1 mg Oral QHS  . feeding supplement (RESOURCE BREEZE)  1 Container Oral q1800  . ferrous sulfate  325 mg Oral Daily  . hydrALAZINE  25 mg Oral Q8H  . insulin aspart  0-5 Units Subcutaneous QHS  . insulin aspart  0-9 Units Subcutaneous TID WC  . metoprolol  50 mg Oral BID  . pantoprazole  80 mg Oral Daily  . potassium chloride  10 mEq Oral BID  . sodium chloride  3 mL Intravenous Q12H  . torsemide  20 mg Oral Daily  . triamcinolone   Mouth/Throat BID    Assessment: 80 yo M on chronic Coumadin 2.5mg  daily for Afib.  This was recently initiated.  INR is rising to goal.  No bleeding noted.  Coumadin was held on admission for thoracentesis today which was done.  Plan resume Coumadin today.   Goal of Therapy:  INR  2-3   Plan:  Coumadin 5mg  po x1 today Daily INR  Elson Clan 11/23/2013,5:06 PM

## 2013-11-23 NOTE — Procedures (Signed)
PreOperative Dx: LEFT pleural effusion Postoperative Dx: LEFT pleural effusion Procedure:   US guided LEFT thoracentesis Radiologist:  Tyron Russell Anesthesia:  7 ml of 1% lidocaine Specimen:  870 ml of amber colored fluid EBL:   None Complications: None

## 2013-11-24 ENCOUNTER — Inpatient Hospital Stay
Admission: RE | Admit: 2013-11-24 | Discharge: 2013-12-25 | Disposition: E | Payer: Medicare Other | Source: Ambulatory Visit | Attending: Internal Medicine | Admitting: Internal Medicine

## 2013-11-24 ENCOUNTER — Encounter (HOSPITAL_COMMUNITY): Payer: Self-pay | Admitting: Internal Medicine

## 2013-11-24 ENCOUNTER — Inpatient Hospital Stay: Admission: RE | Admit: 2013-11-24 | Payer: Medicare Other | Source: Ambulatory Visit | Admitting: Internal Medicine

## 2013-11-24 DIAGNOSIS — R05 Cough: Secondary | ICD-10-CM

## 2013-11-24 DIAGNOSIS — R1310 Dysphagia, unspecified: Principal | ICD-10-CM

## 2013-11-24 DIAGNOSIS — R059 Cough, unspecified: Secondary | ICD-10-CM

## 2013-11-24 DIAGNOSIS — E119 Type 2 diabetes mellitus without complications: Secondary | ICD-10-CM

## 2013-11-24 DIAGNOSIS — I4891 Unspecified atrial fibrillation: Secondary | ICD-10-CM

## 2013-11-24 LAB — PROTIME-INR
INR: 1.66 — ABNORMAL HIGH (ref 0.00–1.49)
Prothrombin Time: 19.1 seconds — ABNORMAL HIGH (ref 11.6–15.2)

## 2013-11-24 LAB — BASIC METABOLIC PANEL
BUN: 115 mg/dL — ABNORMAL HIGH (ref 6–23)
CO2: 22 mEq/L (ref 19–32)
CREATININE: 3.64 mg/dL — AB (ref 0.50–1.35)
Calcium: 9.7 mg/dL (ref 8.4–10.5)
Chloride: 108 mEq/L (ref 96–112)
GFR calc Af Amer: 15 mL/min — ABNORMAL LOW (ref >90)
GFR, EST NON AFRICAN AMERICAN: 13 mL/min — AB (ref >90)
Glucose, Bld: 115 mg/dL — ABNORMAL HIGH (ref 70–99)
Potassium: 4 mEq/L (ref 3.7–5.3)
SODIUM: 147 meq/L (ref 137–147)

## 2013-11-24 LAB — GLUCOSE, CAPILLARY
Glucose-Capillary: 111 mg/dL — ABNORMAL HIGH (ref 70–99)
Glucose-Capillary: 103 mg/dL — ABNORMAL HIGH (ref 70–99)

## 2013-11-24 LAB — CBC
HCT: 26.3 % — ABNORMAL LOW (ref 39.0–52.0)
Hemoglobin: 8.5 g/dL — ABNORMAL LOW (ref 13.0–17.0)
MCH: 30.2 pg (ref 26.0–34.0)
MCHC: 32.3 g/dL (ref 30.0–36.0)
MCV: 93.6 fL (ref 78.0–100.0)
PLATELETS: 368 10*3/uL (ref 150–400)
RBC: 2.81 MIL/uL — AB (ref 4.22–5.81)
RDW: 14.7 % (ref 11.5–15.5)
WBC: 7.3 10*3/uL (ref 4.0–10.5)

## 2013-11-24 MED ORDER — FLUCONAZOLE 100 MG PO TABS
200.0000 mg | ORAL_TABLET | Freq: Once | ORAL | Status: AC
  Administered 2013-11-24: 200 mg via ORAL
  Filled 2013-11-24: qty 2

## 2013-11-24 MED ORDER — INSULIN GLARGINE 100 UNIT/ML ~~LOC~~ SOLN: 10.0000 [IU] | Freq: Every day | SUBCUTANEOUS | Status: AC

## 2013-11-24 MED ORDER — NYSTATIN 100000 UNIT/ML MT SUSP: 5.0000 mL | Freq: Four times a day (QID) | OROMUCOSAL | Status: AC

## 2013-11-24 MED ORDER — WARFARIN SODIUM 2.5 MG PO TABS: 2.5000 mg | ORAL_TABLET | Freq: Once | ORAL | Status: AC

## 2013-11-24 MED ORDER — NYSTATIN 100000 UNIT/ML MT SUSP
5.0000 mL | Freq: Four times a day (QID) | OROMUCOSAL | Status: DC
  Administered 2013-11-24: 500000 [IU] via ORAL
  Filled 2013-11-24: qty 5

## 2013-11-24 MED ORDER — POTASSIUM CHLORIDE CRYS ER 10 MEQ PO TBCR: 10.0000 meq | EXTENDED_RELEASE_TABLET | Freq: Every day | ORAL | Status: DC

## 2013-11-24 MED ORDER — TORSEMIDE 20 MG PO TABS: ORAL_TABLET | ORAL | Status: DC

## 2013-11-24 NOTE — Discharge Summary (Signed)
Physician Discharge Summary  Ashok PallWilliam E Blew UEA:[id number]:[id number] DOB: 1919-06-24 DOA: 11/22/2013  PCP: Colette RibasGOLDING, JOHN CABOT, MD  Admit date: 11/22/2013 Discharge date: 10/30/2013  Time spent: Greater than 30 minutes  Recommendations for Outpatient Follow-up:  1. Recommend palliative care to follow the patient at the skilled nursing facility or obtain a palliative care consult. 2. The patient will need his capillary blood glucose checked 2-3 times daily. 3. The patient's INR will need to be reassessed per protocol at the skilled nursing facility. Adjustments in Coumadin dosing per skilled nursing facility physician.  Discharge Diagnoses:  1. Recent hospitalization for one/09/2014 through 11/22/2013 at [facility] for treatment of acute respiratory failure secondary to aspiration pneumonia, acute diastolic heart failure, and non-ST elevation myocardial infarction. Status post intubation and extubation. 2. Chest pain and shortness of breath, secondary to bilateral pleural effusions, secondary to decompensated diastolic heart failure with superimposed chronic kidney disease. Status post thoracentesis yielding 870 cc of amber colored fluid. 3. Acute on chronic diastolic heart failure. 2-D echocardiogram during previous hospitalization revealed grade 1 diastolic dysfunction and ejection fraction of 55-60%. 4. Atrial fibrillation, recently diagnosed. Now on chronic Coumadin. 5. Recent non-ST elevation myocardial infarction/CAD/hypertension. He is not a candidate for invasive diagnostic workup or treatment. 6. Recent history of a stroke. 7. Status post dual-chamber pacemaker, implanted in 2010. 8. Abdominal aortic aneurysm. 9. Anemia, likely of chronic disease. History of transfusion of one unit of packed red blood cells and IV iron transfusion during the previous hospitalization. 10. Stage IV chronic kidney disease. The patient is not a candidate for hemodialysis per nephrology at [facility]. 11. Oral thrush. 12. Type 2 diabetes mellitus. 13. Chronic debilitation. DO NOT RESUSCITATE status.   Discharge Condition: Stable   Diet recommendation: Heart healthy and carbohydrate modified  Filed Weights   11/22/13 2122 11/23/13 0500 11/08/2013 0300  Weight: 85.1 kg (187 lb 9.8 oz) 85.1 kg (187 lb 9.8 oz) 84.3 kg (185 lb 13.6 oz)    History of present illness:   This is a 80 year old man who was recently hospitalized at [facility] from 11/07/2013 through 11/22/2013 for treatment of acute hypoxic respiratory failure from a combination of aspiration pneumonia, diastolic heart failure, and non-ST elevation myocardial infarction. He was intubated and successfully extubated. Treatment of his heart failure was limited by his worsening renal function as cardio renal syndrome was suspected. He also developed paroxysmal atrial fibrillation and was therefore started on Coumadin. Both the cardiologist and nephrologist recommended medical management without escalation in care and without hemodialysis.. Palliative care consult was pursued. He is chronically bedbound. He is a DO NOT RESUSCITATE status. The patient returned on 11/22/2013 with chest pain and shortness of breath. His chest x-ray revealed worsening bilateral pleural effusions.   Hospital Course:  1. Bilateral pleural effusions/acute on chronic diastolic heart failure.  The patient was treated supportively with oxygen, as needed nitroglycerin, and analgesics. Torsemide was continued but was increased to 20 mg twice a day intravenously. Bumex was given IV for one day, but it was discontinued in favor of IV torsemide. Following the discussion with the patient's family, admitting physician Dr. Onalee Huaavid recommended thoracentesis for treatment of refractory diastolic heart failure in the setting of chronic kidney disease. The family agree. The patient underwent therapeutic thoracentesis which successfully yielded 870 cc of amber colored fluid.  The patient voiced symptomatic relief of chest pain and shortness of breath. He was discharged on 20 mg of torsemide twice a day for 3 days and  then back to 20 mg once daily given that extensive diuresis worsened his renal function during the previous hospitalization. 2. Atrial fibrillation. Recently diagnosed. Coumadin was withheld for one day pending the thoracentesis. It was restarted and dosed by pharmacy. The patient's INR was 1.66 at the time of discharge. Further monitoring and adjustments in dosing will be deferred to the skilled nursing facility physician. His heart rate remained controlled on diltiazem and metoprolol. 3. Recent non-ST elevation myocardial infarction/hypertension. His troponin I was negative x3. He was maintained on Coumadin, metoprolol, diltiazem, and hydralazine. His blood pressure remained stable. 4. Stage IV chronic kidney disease. The patient's creatinine remains stable and even slightly improved compared to several days ago during the previous hospitalization. He is not a candidate for hemodialysis. 5. Anemia secondary to chronic kidney disease. His hemoglobin was essentially stable. 6. Diabetes mellitus. Sliding scale NovoLog was started. His capillary blood glucose ranged in the low 100s to the 130s. Sliding scale NovoLog was discontinued upon discharge in favor of Lantus at bedtime. His capillary blood glucose will need to be assessed 2-3 times daily or per the discretion of the primary care physician. 7. Oral thrush. Triamcinolone paste was discontinued in favor of nystatin solution. He was also given one oral dose of Diflucan. 8. Chronic debilitation. The patient is a DO NOT RESUSCITATE status. He was evaluated by the palliative care team during the previous hospitalization. It was recommended that palliative care conversation continues at the skilled nursing facility.    Procedures:  Thoracentesis on 11/23/2013. Yielded 870 cc of amber color  fluid.  Consultations:  None  Discharge Exam: Filed Vitals:   2013-12-13 0505  BP: 134/81  Pulse: 67  Temp: 98.6 F (37 C)  Resp: 20    General: Alert, but chronically debilitated-appearing 80 year old Caucasian man in no acute distress. Oropharynx: White exudate on tongue. Cardiovascular: Irregular, irregular Respiratory: Breathing nonlabored, air movement better on the left. Decreased breath sounds in the bases.  Discharge Instructions      Discharge Orders   Future Appointments Provider Department Dept Phone   01/12/2014 10:00 AM [facility] Remotes [facility] [place] Office [phone]   Future Orders Complete By Expires   Diet - low sodium heart healthy  As directed    Diet Carb Modified  As directed    Increase activity slowly  As directed        Medication List    STOP taking these medications       insulin aspart 100 UNIT/ML injection  Commonly known as:  novoLOG     triamcinolone 0.1 % paste  Commonly known as:  KENALOG      TAKE these medications       albuterol (2.5 MG/3ML) 0.083% nebulizer solution  Commonly known as:  PROVENTIL  Take 3 mLs (2.5 mg total) by nebulization every 4 (four) hours as needed for wheezing or shortness of breath.     antiseptic oral rinse Liqd  15 mLs by Mouth Rinse route 2 times daily at 12 noon and 4 pm.     atorvastatin 20 MG tablet  Commonly known as:  LIPITOR  Take 1 tablet (20 mg total) by mouth daily at 6 PM.     beta carotene w/minerals tablet  Take 1 tablet by mouth daily.     bisacodyl 10 MG suppository  Commonly known as:  DULCOLAX  Place 1 suppository (10 mg total) rectally daily as needed for moderate constipation.     diltiazem 180 MG 24  hr capsule  Commonly known as:  CARDIZEM CD  Take 180 mg by mouth daily.     doxazosin 2 MG tablet  Commonly known as:  CARDURA  Take 1 mg by mouth at bedtime.     feeding supplement (RESOURCE BREEZE) Liqd  Take 1 Container by mouth daily at 6 PM.      ferrous sulfate 325 (65 FE) MG tablet  Take 325 mg by mouth daily.     fluticasone 50 MCG/ACT nasal spray  Commonly known as:  FLONASE  Place 2 sprays into the nose daily as needed. For allergies     hydrALAZINE 25 MG tablet  Commonly known as:  APRESOLINE  Take 1 tablet (25 mg total) by mouth every 8 (eight) hours.     hydroxypropyl methylcellulose 2.5 % ophthalmic solution  Commonly known as:  ISOPTO TEARS  Place 1 drop into both eyes 3 (three) times daily as needed for dry eyes.     insulin glargine 100 UNIT/ML injection  Commonly known as:  LANTUS  Inject 0.1 mLs (10 Units total) into the skin at bedtime.     metoprolol 50 MG tablet  Commonly known as:  LOPRESSOR  Take 50 mg by mouth 2 (two) times daily.     nitroGLYCERIN 0.4 MG/SPRAY spray  Commonly known as:  NITROLINGUAL  Place 1 spray under the tongue every 5 (five) minutes x 3 doses as needed for chest pain (use as directed).     nystatin 100000 UNIT/ML suspension  Commonly known as:  MYCOSTATIN  Take 5 mLs (500,000 Units total) by mouth 4 (four) times daily. For 7-10 days     omeprazole 40 MG capsule  Commonly known as:  PRILOSEC  Take 40 mg by mouth daily as needed.     polyvinyl alcohol 1.4 % ophthalmic solution  Commonly known as:  LIQUIFILM TEARS  Place 1 drop into both eyes 3 (three) times daily as needed for dry eyes.     potassium chloride 10 MEQ tablet  Commonly known as:  K-DUR,KLOR-CON  Take 1 tablet (10 mEq total) by mouth daily.     Saw Palmetto 450 MG Caps  Take 1 capsule by mouth daily.     senna 8.6 MG Tabs tablet  Commonly known as:  SENOKOT  Take 2 tablets by mouth daily as needed for mild constipation.     torsemide 20 MG tablet  Commonly known as:  DEMADEX  Take 1 tablet twice a day for 3 more days and then back to one tablet once daily.     vitamin B-12 500 MCG tablet  Commonly known as:  CYANOCOBALAMIN  Take 500 mcg by mouth daily.     Vitamin D 2000 UNITS tablet  Take  2,000 Units by mouth daily.     warfarin 2.5 MG tablet  Commonly known as:  COUMADIN  Take 1 tablet (2.5 mg total) by mouth daily at 6 PM.       Allergies  Allergen Reactions  . Lasix [Furosemide] Itching    Scalp broke out  . Bystolic [Nebivolol Hcl] Rash  . Plavix [Clopidogrel Bisulfate] Rash   Follow-up Information   Follow up with [name], MD. Schedule an appointment as soon as possible for a visit in 4 weeks.   Specialty:  Cardiology   Contact information:   [address] [place] [address] [phone]        The results of significant diagnostics from this hospitalization (including imaging, microbiology,  ancillary and laboratory) are listed below for reference.    Significant Diagnostic Studies: Dg Chest 1 View  11/23/2013   CLINICAL DATA:  Left pleural effusion post thoracentesis  EXAM: CHEST - 1 VIEW  COMPARISON:  11/22/2013  FINDINGS: Left subclavian transvenous pacemaker leads project over right atrium and right ventricle.  Enlargement of cardiac silhouette.  Atherosclerotic calcification aorta.  Pulmonary vascularity normal.  Mild persistent left basilar pleural effusion despite prior thoracentesis.  Accompanying compressive atelectasis left base.  No pneumothorax.  Remaining lungs clear.  IMPRESSION: No pneumothorax following left thoracentesis.  Persistent left pleural effusion and basilar atelectasis.   Electronically Signed   By: Ulyses Southward M.D.   On: 11/23/2013 16:04   Dg Chest 1 View  11/22/2013   CLINICAL DATA:  Chest pain  EXAM: CHEST - 1 VIEW  COMPARISON:  Prior chest x-ray 11/09/2013  FINDINGS: Compared to the prior chest x-ray, the patient has been extubated and the nasogastric tube removed. Stable appearance of left subclavian approach cardiac rhythm maintenance device with leads projecting over the right atrium and right ventricle. Interval development of a moderately large layering left pleural effusion and associated left basilar  opacity. Probable small right pleural effusion as well. Stable cardiomegaly and mediastinal contours. Atherosclerotic and mildly ectatic thoracic aorta. No pneumothorax or edema. No acute osseous abnormality.  IMPRESSION: 1. Interval development of a moderately large layering left pleural effusion and associated left basilar atelectasis versus infiltrate. 2. Probable small right pleural effusion and associated atelectasis without significant interval change.   Electronically Signed   By: Malachy Moan M.D.   On: 11/22/2013 17:40   Dg Esophagus  11/10/2013   CLINICAL DATA:  History of dysphagia and dysmotility. Evaluate for aspiration. Recent extubation.  EXAM: ESOPHOGRAM/BARIUM SWALLOW  TECHNIQUE: Single contrast examination was performed using  thin barium.  COMPARISON:  None.  FLUOROSCOPY TIME:  59 seconds  FINDINGS: The patient has limited mobility. Esophagus was initially evaluated in the RPO position with the head rotated towards the right, providing near lateral evaluation of the proximal esophagus. No laryngeal penetration or aspiration was observed.  Additional barium was administered. There is presbyesophagus with a decreased primary stripping wave. There is no evidence of focal ulceration or stricture. A 13 mm barium tablet was administered, and this passed without significant delay into the stomach.  IMPRESSION: 1. No evidence of laryngeal penetration or tracheal aspiration. 2. Presbyesophagus. 3. No mucosal ulceration or stricture identified.   Electronically Signed   By: Roxy Horseman M.D.   On: 11/10/2013 13:03   US Renal Port  11/14/2013   CLINICAL DATA:  Acute on chronic renal disease.  EXAM: RENAL/URINARY TRACT ULTRASOUND COMPLETE  COMPARISON:  Renal ultrasound 05/04/2009.  FINDINGS: Right Kidney:  Length: 9.5 cm. No stone, mass or hydronephrosis is identified. Increased cortical echogenicity is noted.  Left Kidney:  Length: 10.8 cm. No stone, mass or hydronephrosis is identified. Increased  cortical echogenicity is noted.  Bladder:  Incompletely distended but otherwise unremarkable.  IMPRESSION: Negative for hydronephrosis or other acute abnormality.  Increased cortical echogenicity of the kidneys compatible with medical renal disease.   Electronically Signed   By: Drusilla Kanner M.D.   On: 11/14/2013 19:55   Dg Chest Port 1 View  11/09/2013   CLINICAL DATA:  Pneumonia versus CHF.  ETT positioning.  EXAM: PORTABLE CHEST - 1 VIEW  COMPARISON:  Chest x-ray from yesterday.  FINDINGS: Endotracheal tube ends in the mid thoracic trachea. An orogastric tube continues into the  stomach.  Stable cardiomegaly. Distorted mediastinal contours at least partly due to rightward rotation. Hazy appearance of the lower chest suggest pleural fluid or atelectasis. The apical lungs argues against pulmonary edema.  IMPRESSION: 1. Endotracheal and orogastric tubes in good position. 2. Similar appearance of bibasilar opacities, pleural effusions or atelectasis at least. 3. Cardiomegaly and aortic tortuosity or enlargement. Evaluation of the mediastinum limited by rotation.   Electronically Signed   By: Tiburcio Pea M.D.   On: 11/09/2013 05:38   Dg Chest Port 1 View  11/08/2013   CLINICAL DATA:  Pneumonia.  EXAM: PORTABLE CHEST - 1 VIEW  COMPARISON:  11/07/2013.  FINDINGS: Endotracheal tube and NG tube in stable position. Cardiomegaly with normal pulmonary vascularity. Pacemaker noted in stable position. Pulmonary infiltrate on the right is improved slightly. Persistent infiltrate remains. Small pleural effusions and left base atelectasis appear to be present.  IMPRESSION: 1. Improving but persistent right lung infiltrate. Small bilateral pleural effusions and mild left base atelectasis.  2.  Stable line and tube positions.   Electronically Signed   By: Maisie Fus  Register   On: 11/08/2013 07:09   Dg Chest Port 1 View  11/07/2013   CLINICAL DATA:  Respiratory distress, intubation.  EXAM: PORTABLE CHEST - 1 VIEW   COMPARISON:  Chest radiograph October 05, 2013  FINDINGS: Cardiac silhouette remains moderately enlarged, tortuous, mildly ectatic calcified aorta. Right greater than left lung patchy airspace opacities in a background of increasing interstitial prominence and new subsegmental atelectasis. No pleural effusions. No pneumothorax. Mild central pulmonary vasculature congestion.  Endotracheal tube tip projects 5.6 cm above the carina. Dual lead left cardiac pacemaker in situ. Multiple EKG lines overlie the patient and may obscure subtle underlying pathology. soft tissue planes and included osseous structures are nonsuspicious.  IMPRESSION: Stable cardiomegaly, interval increase atelectasis, interstitial and alveolar airspace opacities, which may reflect pulmonary edema or pneumonia.   Electronically Signed   By: Awilda Metro   On: 11/07/2013 05:23   Dg Abd Portable 1v  11/07/2013   CLINICAL DATA:  80 year old male status post NG tube placement. Initial encounter.  EXAM: PORTABLE ABDOMEN - 1 VIEW  COMPARISON:  Upper GI series 329 2006.  FINDINGS: Portable AP supine view at 1428 hrs. Enteric tube in place, side hole projects at the level of the distal thoracic esophagus or gastroesophageal junction. The tip of the tube is at the level of the gastric cardia. Visualized bowel gas pattern is non obstructed. Cardiac pacemaker leads partially visible. Degenerative changes in the spine.  IMPRESSION: NG tube tip at the proximal stomach. Advance 8 cm for more optimal placement.   Electronically Signed   By: Augusto Gamble M.D.   On: 11/07/2013 14:43   US Thoracentesis Asp Pleural Space W/img Guide  11/23/2013   CLINICAL DATA:  Shortness of breath, left pleural effusion  EXAM: ULTRASOUND GUIDED LEFT THORACENTESIS  COMPARISON:  Chest radiograph 11/22/2013  FINDINGS: A total of approximately 170 mL of amber colored fluid was removed. A fluid sample of 120 mL was sent for any needed laboratory analysis.  IMPRESSION: Successful  ultrasound guided left thoracentesis yielding 170 mL of pleural fluid.  PROCEDURE: An ultrasound guided thoracentesis was thoroughly discussed with the patient and questions answered. The benefits, risks, alternatives and complications were also discussed. The patient understands and wishes to proceed with the procedure. Written consent was obtained was obtained from the patient's wife.  Ultrasound was performed to localize and mark an adequate pocket of fluid in the posterior  left chest. The area was then prepped and draped in the normal sterile fashion. 1% Lidocaine was used for local anesthesia. Under ultrasound guidance an 8 French thoracentesis catheter was introduced. Thoracentesis was performed. Thoracentesis was performed utilizing syringe pump. Procedure was terminated once resistance to aspiration encounter de. The catheter was removed and a dressing applied.  Complications: None; no pneumothorax on post procedural chest radiograph.   Electronically Signed   By: Ulyses Southward M.D.   On: 11/23/2013 17:08    Microbiology: No results found for this or any previous visit (from the past 240 hour(s)).   Labs: Basic Metabolic Panel:  Recent Labs Lab 11/21/13 0529 11/22/13 0523 11/22/13 1557 11/23/13 0247 11/05/2013 0529  NA 139 143 142 143 147  K 4.3 4.3 4.0 4.1 4.0  CL 100 106 102 105 108  CO2 18* 22 22 22 22   GLUCOSE 118* 130* 129* 113* 115*  BUN 119* 122* 122* 118* 115*  CREATININE 3.87* 3.70* 3.68* 3.67* 3.64*  CALCIUM 9.2 9.1 9.7 9.7 9.7   Liver Function Tests: No results found for this basename: AST, ALT, ALKPHOS, BILITOT, PROT, ALBUMIN,  in the last 168 hours No results found for this basename: LIPASE, AMYLASE,  in the last 168 hours No results found for this basename: AMMONIA,  in the last 168 hours CBC:  Recent Labs Lab 11/18/13 0705 11/20/13 0515 11/22/13 1557 10/28/2013 0529  WBC 9.0 8.1 9.2 7.3  NEUTROABS  --   --  7.4  --   HGB 7.9* 8.3* 8.8* 8.5*  HCT 24.4* 25.4*  26.8* 26.3*  MCV 91.7 91.4 93.1 93.6  PLT 311 359 403* 368   Cardiac Enzymes:  Recent Labs Lab 11/22/13 1557 11/22/13 2152 11/23/13 0247 11/23/13 0858  TROPONINI <0.30 <0.30 <0.30 <0.30   BNP: BNP (last 3 results)  Recent Labs  10/05/13 1344 11/07/13 0444 11/22/13 1557  PROBNP 3785.0* 8303.0* 27104.0*   CBG:  Recent Labs Lab 11/23/13 0802 11/23/13 1344 11/23/13 1704 11/23/13 2144 10/27/2013 0744  GLUCAP 104* 157* 134* 147* 111*       Signed:  Rainelle Sulewski  [facility] 11/03/2013, 11:44 AM

## 2013-11-24 NOTE — Progress Notes (Signed)
ANTICOAGULATION CONSULT NOTE  Pharmacy Consult for Coumadin Indication: atrial fibrillation  Allergies  Allergen Reactions  . Lasix [Furosemide] Itching    Scalp broke out  . Bystolic [Nebivolol Hcl] Rash  . Plavix [Clopidogrel Bisulfate] Rash    Patient Measurements: Height: 5\' 10"  (177.8 cm) Weight: 185 lb 13.6 oz (84.3 kg) IBW/kg (Calculated) : 73  Vital Signs: Temp: 98.6 F (37 C) (01/29 0505) Temp src: Oral (01/29 0505) BP: 134/81 mmHg (01/29 0505) Pulse Rate: 67 (01/29 0505)  Labs:  Recent Labs  11/22/13 0523  11/22/13 1557 11/22/13 2152 11/23/13 0247 11/23/13 0858 11/16/2013 0529  HGB  --   --  8.8*  --   --   --  8.5*  HCT  --   --  26.8*  --   --   --  26.3*  PLT  --   --  403*  --   --   --  368  LABPROT 16.0*  --   --   --   --   --  19.1*  INR 1.31  --   --   --   --   --  1.66*  CREATININE 3.70*  --  3.68*  --  3.67*  --  3.64*  TROPONINI  --   < > <0.30 <0.30 <0.30 <0.30  --   < > = values in this interval not displayed.  Estimated Creatinine Clearance: 13.4 ml/min (by C-G formula based on Cr of 3.64).   Medical History: Past Medical History  Diagnosis Date  . Coronary artery disease   . Hypertension   . Pacemaker 05/08/2009    [employer]  . CKD (chronic kidney disease), stage III   . Complete heart block   . GERD (gastroesophageal reflux disease)   . Diastolic heart failure   . Myocardial infarction 2010  . Stroke 1998, 2005, 2009  . Shingles   . CHF (congestive heart failure)     diastolic  . COPD (chronic obstructive pulmonary disease)   . Arthritis   . Left-sided weakness   . Peripheral edema   . AAA (abdominal aortic aneurysm)     infrarenal 03/12/12- 3.7 x 3.5 cm  . Anemia   . Atrial fibrillation   . Acute respiratory failure 11/07/2013    Ventilator dependent.  . Diabetes mellitus, type 2 11/23/2013    Medications:  Prescriptions prior to admission  Medication Sig Dispense Refill  . atorvastatin (LIPITOR) 20 MG tablet Take 1  tablet (20 mg total) by mouth daily at 6 PM.  30 tablet  1  . beta carotene w/minerals (OCUVITE) tablet Take 1 tablet by mouth daily.      . Cholecalciferol (VITAMIN D) 2000 UNITS tablet Take 2,000 Units by mouth daily.      Marland Kitchen diltiazem (CARDIZEM CD) 180 MG 24 hr capsule Take 180 mg by mouth daily.      Marland Kitchen doxazosin (CARDURA) 2 MG tablet Take 1 mg by mouth at bedtime.      . ferrous sulfate 325 (65 FE) MG tablet Take 325 mg by mouth daily.      . fluticasone (FLONASE) 50 MCG/ACT nasal spray Place 2 sprays into the nose daily as needed. For allergies      . hydroxypropyl methylcellulose (ISOPTO TEARS) 2.5 % ophthalmic solution Place 1 drop into both eyes 3 (three) times daily as needed for dry eyes.      . insulin aspart (NOVOLOG) 100 UNIT/ML injection Inject 0-5 Units into the skin at bedtime.  10 mL  11  . metoprolol (LOPRESSOR) 50 MG tablet Take 50 mg by mouth 2 (two) times daily.      . nitroGLYCERIN (NITROLINGUAL) 0.4 MG/SPRAY spray Place 1 spray under the tongue every 5 (five) minutes x 3 doses as needed for chest pain (use as directed).      Marland Kitchen. omeprazole (PRILOSEC) 40 MG capsule Take 40 mg by mouth daily as needed.       . Saw Palmetto 450 MG CAPS Take 1 capsule by mouth daily.      Marland Kitchen. senna (SENOKOT) 8.6 MG TABS tablet Take 2 tablets by mouth daily as needed for mild constipation.      . torsemide (DEMADEX) 20 MG tablet Take 20 mg by mouth daily.      . vitamin B-12 (CYANOCOBALAMIN) 500 MCG tablet Take 500 mcg by mouth daily.      Marland Kitchen. albuterol (PROVENTIL) (2.5 MG/3ML) 0.083% nebulizer solution Take 3 mLs (2.5 mg total) by nebulization every 4 (four) hours as needed for wheezing or shortness of breath.  75 mL  12  . antiseptic oral rinse (BIOTENE) LIQD 15 mLs by Mouth Rinse route 2 times daily at 12 noon and 4 pm.      . bisacodyl (DULCOLAX) 10 MG suppository Place 1 suppository (10 mg total) rectally daily as needed for moderate constipation.  12 suppository  0  . feeding supplement, RESOURCE  BREEZE, (RESOURCE BREEZE) LIQD Take 1 Container by mouth daily at 6 PM.    0  . hydrALAZINE (APRESOLINE) 25 MG tablet Take 1 tablet (25 mg total) by mouth every 8 (eight) hours.      . insulin aspart (NOVOLOG) 100 UNIT/ML injection Inject 0-9 Units into the skin 3 (three) times daily with meals.  10 mL  11  . polyvinyl alcohol (LIQUIFILM TEARS) 1.4 % ophthalmic solution Place 1 drop into both eyes 3 (three) times daily as needed for dry eyes.  15 mL  0  . triamcinolone (KENALOG) 0.1 % paste Use as directed in the mouth or throat 2 (two) times daily.  5 g  12  . warfarin (COUMADIN) 2.5 MG tablet Take 1 tablet (2.5 mg total) by mouth daily at 6 PM.       Scheduled:  . antiseptic oral rinse  15 mL Mouth Rinse q12n4p  . atorvastatin  20 mg Oral q1800  . beta carotene w/minerals  1 tablet Oral Daily  . bumetanide (BUMEX) IV  1 mg Intravenous Q12H  . diltiazem  180 mg Oral Daily  . doxazosin  1 mg Oral QHS  . feeding supplement (RESOURCE BREEZE)  1 Container Oral q1800  . ferrous sulfate  325 mg Oral Daily  . hydrALAZINE  25 mg Oral Q8H  . insulin aspart  0-5 Units Subcutaneous QHS  . metoprolol  50 mg Oral BID  . pantoprazole  80 mg Oral Daily  . potassium chloride  10 mEq Oral BID  . sodium chloride  3 mL Intravenous Q12H  . torsemide  20 mg Oral Daily  . triamcinolone   Mouth/Throat BID  . Warfarin - Pharmacist Dosing Inpatient   Does not apply Q24H    Assessment: 80 yo M on chronic Coumadin 2.5mg  daily for Afib.  This was recently initiated.  INR is rising to goal.  No bleeding noted.  Coumadin was resumed s/p thoracentesis 1/28.  Goal of Therapy:  INR 2-3   Plan:  Coumadin 2.5mg  po x1 today Daily INR  Elson ClanLilliston, Chelcee Korpi Michelle 11/08/2013,8:01  AM

## 2013-11-24 NOTE — Clinical Social Work Note (Signed)
Pt d/c today to [facility]. Pt and facility aware and agreeable. D/C summary faxed. Pt to transfer with RN.  Daryle Boyington, LCSW [phone] 

## 2013-11-24 NOTE — Progress Notes (Signed)
Called report to [place], Charity fundraiser at [facility].  Verbalized understanding. Pt transferred to facility via nursing staff. [place], Candelaria Stagers 12-04-2013

## 2013-11-25 ENCOUNTER — Non-Acute Institutional Stay (SKILLED_NURSING_FACILITY): Payer: Medicare Other | Admitting: Internal Medicine

## 2013-11-25 DIAGNOSIS — E119 Type 2 diabetes mellitus without complications: Secondary | ICD-10-CM

## 2013-11-25 DIAGNOSIS — I639 Cerebral infarction, unspecified: Secondary | ICD-10-CM

## 2013-11-25 DIAGNOSIS — I4891 Unspecified atrial fibrillation: Secondary | ICD-10-CM

## 2013-11-25 DIAGNOSIS — I6350 Cerebral infarction due to unspecified occlusion or stenosis of unspecified cerebral artery: Secondary | ICD-10-CM

## 2013-11-25 DIAGNOSIS — I2510 Atherosclerotic heart disease of native coronary artery without angina pectoris: Secondary | ICD-10-CM

## 2013-11-25 DIAGNOSIS — N184 Chronic kidney disease, stage 4 (severe): Secondary | ICD-10-CM

## 2013-11-25 DIAGNOSIS — I509 Heart failure, unspecified: Secondary | ICD-10-CM

## 2013-11-25 DIAGNOSIS — I5032 Chronic diastolic (congestive) heart failure: Secondary | ICD-10-CM

## 2013-11-25 LAB — GLUCOSE, CAPILLARY
GLUCOSE-CAPILLARY: 93 mg/dL (ref 70–99)
Glucose-Capillary: 119 mg/dL — ABNORMAL HIGH (ref 70–99)
Glucose-Capillary: 167 mg/dL — ABNORMAL HIGH (ref 70–99)
Glucose-Capillary: 103 mg/dL — ABNORMAL HIGH (ref 70–99)
Glucose-Capillary: 116 mg/dL — ABNORMAL HIGH (ref 70–99)
Glucose-Capillary: 128 mg/dL — ABNORMAL HIGH (ref 70–99)

## 2013-11-26 LAB — GLUCOSE, CAPILLARY
GLUCOSE-CAPILLARY: 99 mg/dL (ref 70–99)
Glucose-Capillary: 120 mg/dL — ABNORMAL HIGH (ref 70–99)

## 2013-11-26 NOTE — Progress Notes (Signed)
Patient ID: [name], male   DOB: [birth year], 80 y.o.   MRN: [id number]   This is an acute visit.  Level of care skilled.  Facility [facility].  CH   complaint acute visit for acute respiratory failure secondary to aspiration pneumonia acute diastolic heart failure and non-ST elevation MI.  History of present illness.  Patient is a pleasant 80 year old male who was recently hospitalized earlier this month for acute hypoxic respiratory failure from a combination of aspiration pneumonia diastolic CHF and non-ST elevation MI-he was intubated and successfully extubated.  Treatment of his heart failure was limited by his renal function.  He also developed atrial fibrillation and was started on Coumadin.  Cardiologist and nephrologist recommend medical management without escalation in care and without dialysis.  Palliative care consult was pursued.  Apparently patient returned on January 27 with chest pain and shortness of breath and his chest x-ray showed worsening bilateral pleural effusions.  He was treated with oxygen and when necessary nitroglycerin and analgesics-torsemide was increased to 20 mg twice a day IV-Bumex was also given short-term.  Initially patient received a thoracentesis which yielded 870 cc of amber colored fluid.  Apparently patient experienced relief and has been discharged on 20 mg of torsemide twice a day for 3 days and then back to 20 mg once a day.  Coumadin was held for one day secondary to the thoracentesis and was restarted and dosed by pharmacy INR apparently today was 1.66 this will need to be monitored of course.  In regards to his a non-ST MI he continues on metoprolol diltiazem and hydralazine apparently this was stable during his most recent hospitalization.  He continues with stage IV chronic kidney disease and his creatinine today is 3.84 with a BUN of 110 this appears to be relatively baseline.  He is a type II diabetic blood sugars apparently were  fairly well controlled in the hospital sliding scale NovoLog was discontinued in favor of Lantus at at bedtime-blood sugar so far the facility have been in the low 100s.  Patient also has oral thrush and was started on nystatin solution also got a dose of Diflucan.  Patient was evaluated by the palliative care team during his previous hospitalization secondary to chronic debilitation there is recommendations be followed up on in skilled nursing.  Previous medical history.  History of acute respiratory failure multifactorial.  History of diastolic CHF 2-D echo revealed grade 1 diastolic dysfunction and ejection fraction of 55-60%.  History of April fibrillation fairly new diagnosis on chronic Coumadin.  History of recent non-ST elevation MI.  Coronary artery disease.  Hypertension.  History CVA.  Status post total chamber pacemaker implant in 2010.  Abdominal aortic aneurysm.  Anemia most likely chronic disease he did receive one transfusion in the hospital and IV iron transfusion during previous hospitalization.  Stage IV chronic kidney disease deemed not a candidate for dialysis per nephrology.  Type 2 diabetes.  Oral thrush.  Chronic debilitation  Surgical history.  Previous history of hernia repair.  Status post cataract extraction.  Status post pacemaker placement.  Hospital studies.  11/23/2013.  Chest x-ray showed persistent leftl pleural effusion and bibasilar atelectasis  11/23/2013-thoracentesis again yielded 837 mm of amber-colored fluid.  Recent 2-D echo showed an ejection fraction 55-60% with grade 1 diastolic dysfunction.  .  Medications.  Albuterol nebulizers every 4 hours when necessary.  Antiseptic oral rinse twice a day.  Lipitor 20 mg daily.  Multivitamin daily.  Dulcolax 10 mg daily when  necessary.  Cardizem CD 180 mg daily.  Cardura 2 mg each bedtime.  Iron 325 mg daily.  Flonase 2 sprays in no stable he when  necessary.  Hydralazine 25 mg every 8 hours.  Isopto tears 3 times a day when necessary.  Lantus insulin 10 units each bedtime.  Lopressor 50 mg twice a day.  Nitroglycerin when necessary.  Nystatin suspension 5 mL 4 times a day for 7-10 days.  Prilosec 40 mg daily when necessary.  Liquifilm Tears 3 times a day when necessary  potassium 10 mEq daily.  Saw palmetto 450 mg daily.  Senokot 2 tabs daily when necessary.  Torsemide 20 mg twice a day for 2 additional days and then back to once a day.  Vitamin B12 500 mg daily.  Vitamin D 2000 units daily.  Coumadin 2.5 mg daily.  Review of systems  General denies any fever or chills or shortness of breath at this time he is weak.  Skin-does not complaining of any rash or itching.  Head ears eyes nose mouth and throat-does not complaining of any visual changes he does have thrush is receiving nystatin does not complain specifically of throat pain.  Respiratory-does not complaining of any shortness of breath at this time and has a significant history here.  Cardiac-extensive history here as well but does not complaining of chest pain has fairly minimal edema.  GI-says he actually has a decent appetite does not complaining of abdominal pain nausea vomiting diarrhea or constipation.  GU-does not complaining of dysuria.  Muscle skeletal-has generalized weakness but does not complaining of joint pain today.  Neurologic does not complaining of headache dizziness or syncopal-type feelings.  Psych is not complaining of depression or anxiety.  Physical exam.  Temperature is 98.0 pulse 73 respirations 18 blood pressure 146/70-127/69-O2 saturation is in the 90s on 2 L via nasal cannula-weight is 177.4.  In general this is a frail elderly male in no distress lying comfortably in bed.  His skin is warm and dry he does have some violaceous bruising of his right lower abdomen.  Eyes pupils appear equal round reactive light  acuity appears intact sclera and conjunctiva are clear.  Oropharynx he does have a slight white film on the time apparently this is improving mucous membranes are moist.  Chest is clear to auscultation with shallow air entry no labored breathing.  Heart is regular rate and rhythm with a 2/6 systolic murmur he has trace lower extremity edema.  Abdomen is soft nontender with positive bowel sounds.  Muscle skeletal he has general frailty and some slight left-sided weakness status post CVA is able to move all extremities x4 however.  Neurologic is grossly intact except there is some left-sided weakness status post CVA he is able to move and grip however with some forced.  Psych he is alert and oriented x3 pleasant and appropriate.  Labs.  11/25/2013.  INR-1.71.  Sodium 149 potassium 4.2 BUN 110 creatinine 3.84.  Liver function tests within normal limits except bilirubin of 0.2 and albumin of 2.2.  Generally 29 2015.  Sodium 147 potassium 4 BUN 115 creatinine 3.64.  CBC 7.3 hemoglobin 8.5 platelets 368.  Assessment and plan.  #1-history of bilateral pleural effusions acute on chronic diastolic CHF-at this point is very fragile but appears clinically stable he is on torsemide twice a day will reduce this to once a day secondary to his sodium which is trending up-this was discussed with Dr. Leanord Hawking via phone-will need to update metabolic panel  tomorrow as well-monitor weights daily-at this point he is very fragile but does not appear to be unstable.  #2-history of atrial fibrillation-he is on Coumadin INR is trending back up Will continue current dose and recheck this tomorrow-he is on a beta blocker as well as Cardizem.  #3-recent non-ST MI-continues on Coumadin Metroprolol Cardizem and hydralazine will monitor blood pressures there is some variability but I do not see consistent elevations.--He also continues on a statin with history of coronary artery disease  #4-stage IV chronic  kidney disease this is significant deemed not a dialysis candidate will update metabolic panel this appears to be relatively stable but very significant complicating certainly his treatment for diastolic CHF.   #5-history of anemia secondary to chronic kidney disease-hemoglobin has been stable he is status post transfusion in the hospital.  #6-diabetes type 2-he has been started on Lantus so far sugars appear to be satisfactory in the low 100s this will continue to be monitored.  #7-history of thrush this appears to be resolving on nystatin  #8--history of chronic debilitation-recommendation for palliative  care this will need followup during his stay here  #9 past history of CVA-appears to have some mild left-sided weakness which is not new-he is on anticoagulation  [id number]-of note greater than 40 minutes spent assessing patient-reviewing his hospital records-discussing his status with nursing staff-and coordinating and formulating a plan of care-of note greater than 50% of the time spent coordinating plan of care  .

## 2013-11-27 LAB — GLUCOSE, CAPILLARY
GLUCOSE-CAPILLARY: 150 mg/dL — AB (ref 70–99)
GLUCOSE-CAPILLARY: 195 mg/dL — AB (ref 70–99)
Glucose-Capillary: 103 mg/dL — ABNORMAL HIGH (ref 70–99)
Glucose-Capillary: 115 mg/dL — ABNORMAL HIGH (ref 70–99)
Glucose-Capillary: 89 mg/dL (ref 70–99)
Glucose-Capillary: 110 mg/dL — ABNORMAL HIGH (ref 70–99)

## 2013-11-27 DEATH — deceased

## 2013-11-28 ENCOUNTER — Non-Acute Institutional Stay (SKILLED_NURSING_FACILITY): Payer: Medicare Other | Admitting: Internal Medicine

## 2013-11-28 DIAGNOSIS — N184 Chronic kidney disease, stage 4 (severe): Secondary | ICD-10-CM

## 2013-11-28 DIAGNOSIS — I4891 Unspecified atrial fibrillation: Secondary | ICD-10-CM

## 2013-11-28 DIAGNOSIS — I5032 Chronic diastolic (congestive) heart failure: Secondary | ICD-10-CM

## 2013-11-28 DIAGNOSIS — E119 Type 2 diabetes mellitus without complications: Secondary | ICD-10-CM

## 2013-11-28 DIAGNOSIS — I509 Heart failure, unspecified: Secondary | ICD-10-CM

## 2013-11-28 LAB — GLUCOSE, CAPILLARY
GLUCOSE-CAPILLARY: 104 mg/dL — AB (ref 70–99)
Glucose-Capillary: 164 mg/dL — ABNORMAL HIGH (ref 70–99)
Glucose-Capillary: 94 mg/dL (ref 70–99)

## 2013-11-28 NOTE — Progress Notes (Signed)
Patient ID: [name], male   DOB: 1918/12/19, 80 y.o.   MRN: [id number]  Facility; [facility] Chief complaint; admission to SNF post admit to home health from 1/27 to 1/29  History; this was a patient who was just discharged from hospital after staying from 12/20 through 1/27 for treatment of acute respiratory failure secondary to aspiration pneumonia, diastolic heart failure and a non-ST elevation MI. He was actually intubated during this stay as it was suspected that he had cardiorenal syndrome. He developed paroxysmal atrial fib. He was seen by cardiology and nephrology and both agreed that he could not tolerate hemodialysis. A palliative care consult was pursued that. The patient returns on 1/27 with chest pain and shortness of breath his chest x-ray showed worsening pleural effusion superior he was started on IV torsemide to 20 twice a day. He underwent thoracentesis of 870 cc of amber-colored fluid per he had atrial fibrillation which was recently diagnosed upper patient's INR was 1.66 at the time of discharge history pulmonitis were negative on this admission. He has stage IV chronic renal failure with a creatinine in the mid 3 range he was treated for oral thrush  Current labs hemoglobin of 8.8, INR of 1.99, sodium of 153 BUN of 99 creatinine of 3.52  Past Medical History  Diagnosis Date  . Coronary artery disease   . Hypertension   . Pacemaker 05/08/2009    Medtronic  . CKD (chronic kidney disease), stage III   . Complete heart block   . GERD (gastroesophageal reflux disease)   . Diastolic heart failure   . Myocardial infarction 2010  . Stroke 1998, 2005, 2009  . Shingles   . CHF (congestive heart failure)     diastolic  . COPD (chronic obstructive pulmonary disease)   . Arthritis   . Left-sided weakness   . Peripheral edema   . AAA (abdominal aortic aneurysm)     infrarenal 03/12/12- 3.7 x 3.5 cm  . Anemia   . Atrial fibrillation   . Acute respiratory failure 11/07/2013     Ventilator dependent.  . Diabetes mellitus, type 2 11/23/2013  . Pleural effusion, bilateral 11/23/2013   Past Surgical History  Procedure Laterality Date  . Eye surgery      tear duct probing  . Permanent pacemaker insertion  05/08/2009    Medtronic  . Hernia repair  10 yrs ago    [facility]  . Cataract extraction w/phaco  02/03/2012    Procedure: CATARACT EXTRACTION PHACO AND INTRAOCULAR LENS PLACEMENT (IOC);  Surgeon: Loraine Leriche T. Nile Stachowski, MD;  Location: [facility];  Service: Ophthalmology;  Laterality: Left;  CDE=29.12  . Cataract extraction w/phaco  03/23/2012    Procedure: CATARACT EXTRACTION PHACO AND INTRAOCULAR LENS PLACEMENT (IOC);  Surgeon: Loraine Leriche T. Nile Gotto, MD;  Location: [facility];  Service: Ophthalmology;  Laterality: Right;  CDE 22.98  . Nm myocar perf wall motion  06/13/2009    mild to mod ischemia basal inferior & mid inferior regions   Current Outpatient Prescriptions on File Prior to Visit  Medication Sig Dispense Refill  . albuterol (PROVENTIL) (2.5 MG/3ML) 0.083% nebulizer solution Take 3 mLs (2.5 mg total) by nebulization every 4 (four) hours as needed for wheezing or shortness of breath.  75 mL  12  . antiseptic oral rinse (BIOTENE) LIQD 15 mLs by Mouth Rinse route 2 times daily at 12 noon and 4 pm.      . atorvastatin (LIPITOR) 20 MG tablet Take 1 tablet (20 mg total) by  mouth daily at 6 PM.  30 tablet  1  . beta carotene w/minerals (OCUVITE) tablet Take 1 tablet by mouth daily.      . bisacodyl (DULCOLAX) 10 MG suppository Place 1 suppository (10 mg total) rectally daily as needed for moderate constipation.  12 suppository  0  . Cholecalciferol (VITAMIN D) 2000 UNITS tablet Take 2,000 Units by mouth daily.      Marland Kitchen diltiazem (CARDIZEM CD) 180 MG 24 hr capsule Take 180 mg by mouth daily.      Marland Kitchen doxazosin (CARDURA) 2 MG tablet Take 1 mg by mouth at bedtime.      . feeding supplement, RESOURCE BREEZE, (RESOURCE BREEZE) LIQD Take 1 Container by mouth daily at 6 PM.    0  .  ferrous sulfate 325 (65 FE) MG tablet Take 325 mg by mouth daily.      . fluticasone (FLONASE) 50 MCG/ACT nasal spray Place 2 sprays into the nose daily as needed. For allergies      . hydrALAZINE (APRESOLINE) 25 MG tablet Take 1 tablet (25 mg total) by mouth every 8 (eight) hours.      . hydroxypropyl methylcellulose (ISOPTO TEARS) 2.5 % ophthalmic solution Place 1 drop into both eyes 3 (three) times daily as needed for dry eyes.      . insulin glargine (LANTUS) 100 UNIT/ML injection Inject 0.1 mLs (10 Units total) into the skin at bedtime.      . metoprolol (LOPRESSOR) 50 MG tablet Take 50 mg by mouth 2 (two) times daily.      . nitroGLYCERIN (NITROLINGUAL) 0.4 MG/SPRAY spray Place 1 spray under the tongue every 5 (five) minutes x 3 doses as needed for chest pain (use as directed).      . nystatin (MYCOSTATIN) 100000 UNIT/ML suspension Take 5 mLs (500,000 Units total) by mouth 4 (four) times daily. For 7-10 days  60 mL  0  . omeprazole (PRILOSEC) 40 MG capsule Take 40 mg by mouth daily as needed.       . polyvinyl alcohol (LIQUIFILM TEARS) 1.4 % ophthalmic solution Place 1 drop into both eyes 3 (three) times daily as needed for dry eyes.  15 mL  0  . potassium chloride (K-DUR,KLOR-CON) 10 MEQ tablet Take 1 tablet (10 mEq total) by mouth daily.      . Saw Palmetto 450 MG CAPS Take 1 capsule by mouth daily.      Marland Kitchen senna (SENOKOT) 8.6 MG TABS tablet Take 2 tablets by mouth daily as needed for mild constipation.      . torsemide (DEMADEX) 20 MG tablet Take 1 tablet twice a day for 3 more days and then back to one tablet once daily.      . vitamin B-12 (CYANOCOBALAMIN) 500 MCG tablet Take 500 mcg by mouth daily.      Marland Kitchen warfarin (COUMADIN) 2.5 MG tablet Take 1 tablet (2.5 mg total) by mouth daily at 6 PM.       No current facility-administered medications on file prior to visit.    Social ZO:XWRUE at home with his wife. Prior to the admission in December he was able to walk with a walker. He was  independent with ADLs. He appears to be nonambulatory now  reports that he has never smoked. He has never used smokeless tobacco. He reports that he drinks alcohol. He reports that he does not use illicit drugs.   Review of Systems: Respiratory; he is not complaining of shortness of breath Cardiac no chest  pain GI no abdominal pain. He has a large diarrheal bowel movement Extremities lower extremity weakness with no pain. He is nonambulatory   Physical exam Gen. very frail elderly man but not acutely medically 1ll Vitals; O2 sat is 96% pulse rate 86 respirations 24 HEENT. Irritated looking oropharynx probably a combination of intubation, perhaps residual thrush Respiratory decreased air entry bilaterally especially on the left no wheezing work of breathing does not look to be excessive Cardiac: Heart sounds are distant. There is a 2/6 systolic ejection murmur at the aortic area jugular venous pressure is not elevated Abdomen; no liver no spleen no tenderness he was incontinent of a large amount of liquid stool GU; bladder is not distended there is no tenderness no CVA tenderness Neurologic; very weak in the lower extremities although he still has antigravity strength at. He is weaker on the left arm and leg apparently secondary to an old stroke according to his wife. Reflexes are 3+ at both knee jerks plantar response equivocal  Impression/plan #1 end-stage chronic renal failure his creatinine today is 3.52 BUN is 99. His sodium is 153. The sodium would support intervascular volume depletion. Output is Demadex on hold #2 pleural effusion status post thoracentesis. Presumably transudate #3 acute on chronic diastolic heart failure. His volume status is a bit difficult to determine over the sodium elevation would support that he is actually dehydrated #4 atrial fibrillation now on Coumadin according to his wife he has a history of recurrent strokes #5 anemia secondary to chronic renal  failure #6 chronic debilitation; I would agree with this I don't think that he is going to be able to ambulate and as his cardiorespiratory reserve won't be able to handle the work up is required. It is suggested that we discuss end-of-life care and I don't disagree with that #7 urinary frequency and urgency yet he didn't seem able to void I'm going to do a bladder ultrasound   Results for DRE, FORGACS (MRN [id number]) as of 11/28/2013 12:28  Ref. Range 11/22/2013 15:57 11/22/2013 21:52 11/23/2013 02:47 11/23/2013 08:58 12/09/2013 05:29  Sodium Latest Range: 137-147 mEq/L 142  143  147  Potassium Latest Range: 3.7-5.3 mEq/L 4.0  4.1  4.0  Chloride Latest Range: 96-112 mEq/L 102  105  108  CO2 Latest Range: 19-32 mEq/L 22  22  22   BUN Latest Range: 6-23 mg/dL 491 (H)  791 (H)  505 (H)  Creatinine Latest Range: 0.50-1.35 mg/dL 6.97 (H)  9.48 (H)  0.16 (H)  Calcium Latest Range: 8.4-10.5 mg/dL 9.7  9.7  9.7  GFR calc non Af Amer Latest Range: >90 mL/min 13 (L)  13 (L)  13 (L)  GFR calc Af Amer Latest Range: >90 mL/min 15 (L)  15 (L)  15 (L)  Glucose Latest Range: 70-99 mg/dL 553 (H)  748 (H)  270 (H)  Troponin I Latest Range: <0.30 ng/mL <0.30 <0.30 <0.30 <0.30   Pro B Natriuretic peptide (BNP) Latest Range: 0-450 pg/mL 27104.0 (H)      WBC Latest Range: 4.0-10.5 K/uL 9.2    7.3  RBC Latest Range: 4.22-5.81 MIL/uL 2.88 (L)    2.81 (L)  Hemoglobin Latest Range: 13.0-17.0 g/dL 8.8 (L)    8.5 (L)  HCT Latest Range: 39.0-52.0 % 26.8 (L)    26.3 (L)  MCV Latest Range: 78.0-100.0 fL 93.1    93.6  MCH Latest Range: 26.0-34.0 pg 30.6    30.2  MCHC Latest Range: 30.0-36.0 g/dL 78.6    32.3  RDW Latest Range: 11.5-15.5 % 14.7    14.7  Platelets Latest Range: 150-400 K/uL 403 (H)    368  Neutrophils Relative % Latest Range: 43-77 % 81 (H)      Lymphocytes Relative Latest Range: 12-46 % 8 (L)      Monocytes Relative Latest Range: 3-12 % 11      Eosinophils Relative Latest Range: 0-5 % 0      Basophils  Relative Latest Range: 0-1 % 0      NEUT# Latest Range: 1.7-7.7 K/uL 7.4      Lymphocytes Absolute Latest Range: 0.7-4.0 K/uL 0.7      Monocytes Absolute Latest Range: 0.1-1.0 K/uL 1.0      Eosinophils Absolute Latest Range: 0.0-0.7 K/uL 0.0      Basophils Absolute Latest Range: 0.0-0.1 K/uL 0.0      Prothrombin Time Latest Range: 11.6-15.2 seconds     19.1 (H)  INR Latest Range: 0.00-1.49      1.66 (H)     Left subclavian transvenous pacemaker leads project over right atrium and right ventricle.   Enlargement of cardiac silhouette.   Atherosclerotic calcification aorta.   Pulmonary vascularity normal.   Mild persistent left basilar pleural effusion despite prior thoracentesis.   Accompanying compressive atelectasis left base.   No pneumothorax.   Remaining lungs clear.   IMPRESSION: No pneumothorax following left thoracentesis.   Persistent left pleural effusion and basilar atelectasis.     Electronically Signed   By: Ulyses Southward M.D.   On: 11/23/2013 16:04      ------------------------------------------------------------ Transthoracic Echocardiography  Patient:    [name], [name] MR #:       [id number] Study Date: 11/08/2013 Gender:     M Age:        89 Height:     182.9cm Weight:     84.4kg BSA:        2.20m^2 Pt. Status: Room:       1233    ATTENDING    Cyril Mourning  SONOGRAPHER  Dewitt Hoes, RDCS  ADMITTING    Zubelevitskiy, Daralene Milch, Christopher  REFERRING    McAlhany, Christopher  PERFORMING   Chmg, Inpatient cc:  ------------------------------------------------------------ LV EF: 55% -   60%  ------------------------------------------------------------ Indications:      MI - acute 410.91.  ------------------------------------------------------------ History:   PMH:   Coronary artery disease.  Congestive heart failure.  Stroke.  PMH:   Myocardial infarction.  Risk factors:  CKD stage IV. AAA. Acute respiratory  failure. Lethargy. Dehydration. Acute sinusitis. Acute renal failure. Hypertension. Dyslipidemia.  ------------------------------------------------------------ Study Conclusions  - Left ventricle: The cavity size was normal. Wall thickness   was increased in a pattern of moderate LVH. Systolic   function was normal. The estimated ejection fraction was   in the range of 55% to 60%. There is hypokinesis of the   apical myocardium. Doppler parameters are consistent with   abnormal left ventricular relaxation (grade 1 diastolic   dysfunction). Doppler parameters are consistent with high   ventricular filling pressure. - Aortic valve: Valve mobility was restricted. There was   mild stenosis. Trivial regurgitation. - Mitral valve: Calcified annulus. Mild regurgitation. - Left atrium: The atrium was moderately dilated. - Right atrium: The atrium was mildly dilated. - Pulmonary arteries: Systolic pressure was mildly to   moderately increased. PA peak pressure: 50mm Hg (S). - Pericardium, extracardiac: There was a left pleural   effusion. Impressions:  - Compared  to study of 10/06/13, there appears to be mild   apical hypokinesis (? related to pacing); otherwise,   findings are similar.  ------------------------------------------------------------ Labs, prior tests, procedures, and surgery: Permanent pacemaker system implantation (2010).    Currently implanted device: Medtronic.  Transthoracic echocardiography.  M-mode, complete 2D, spectral Doppler, and color Doppler.  Height:  Height: 182.9cm. Height: 72in.  Weight:  Weight: 84.4kg. Weight: 185.6lb.  Body mass index:  BMI: 25.2kg/m^2.  Body surface area:    BSA: 2.6846m^2.  Blood pressure:     133/78.  Patient status:  Inpatient.  Location:  ICU/CCU  ------------------------------------------------------------  ------------------------------------------------------------ Left ventricle:  The cavity size was normal. Wall  thickness was increased in a pattern of moderate LVH. Systolic function was normal. The estimated ejection fraction was in the range of 55% to 60%.  Regional wall motion abnormalities:   There is hypokinesis of the apical myocardium. Doppler parameters are consistent with abnormal left ventricular relaxation (grade 1 diastolic dysfunction). Doppler parameters are consistent with high ventricular filling pressure.  ------------------------------------------------------------ Aortic valve:   Trileaflet; mildly calcified leaflets. Valve mobility was restricted.  Doppler:   There was mild stenosis.    Trivial regurgitation.    Indexed valve area: 0.83cm^2/m^2 (VTI). Peak velocity ratio of LVOT to aortic valve: 0.47. Indexed valve area: 0.71cm^2/m^2 (Vmax). Mean gradient: 17mm Hg (S). Peak gradient: 31mm Hg (S).  ------------------------------------------------------------ Aorta:  Aortic root: The aortic root was normal in size.  ------------------------------------------------------------ Mitral valve:   Calcified annulus. Mobility was not restricted.  Doppler:  Transvalvular velocity was within the normal range. There was no evidence for stenosis.  Mild regurgitation.    Peak gradient: 2mm Hg (D).  ------------------------------------------------------------ Left atrium:  The atrium was moderately dilated.  ------------------------------------------------------------ Right ventricle:  The cavity size was normal. Pacer wire or catheter noted in right ventricle. Systolic function was normal.  ------------------------------------------------------------ Pulmonic valve:    Doppler:  Transvalvular velocity was within the normal range. There was no evidence for stenosis.   ------------------------------------------------------------ Tricuspid valve:   Structurally normal valve.    Doppler: Transvalvular velocity was within the normal range.   Mild regurgitation.  ------------------------------------------------------------ Pulmonary artery:   Systolic pressure was mildly to moderately increased.  ------------------------------------------------------------ Right atrium:  The atrium was mildly dilated. Pacer wire or catheter noted in right atrium.  ------------------------------------------------------------ Pericardium:  There was no pericardial effusion.  ------------------------------------------------------------ Systemic veins: Inferior vena cava: The vessel was mildly dilated.  ------------------------------------------------------------ Pleura:  There was a left pleural effusion.

## 2013-11-29 LAB — GLUCOSE, CAPILLARY
GLUCOSE-CAPILLARY: 95 mg/dL (ref 70–99)
GLUCOSE-CAPILLARY: 179 mg/dL — AB (ref 70–99)
Glucose-Capillary: 97 mg/dL (ref 70–99)
Glucose-Capillary: 123 mg/dL — ABNORMAL HIGH (ref 70–99)

## 2013-11-30 ENCOUNTER — Non-Acute Institutional Stay (SKILLED_NURSING_FACILITY): Payer: Medicare Other | Admitting: Internal Medicine

## 2013-11-30 DIAGNOSIS — E870 Hyperosmolality and hypernatremia: Secondary | ICD-10-CM

## 2013-11-30 DIAGNOSIS — N184 Chronic kidney disease, stage 4 (severe): Secondary | ICD-10-CM

## 2013-11-30 LAB — GLUCOSE, CAPILLARY
GLUCOSE-CAPILLARY: 91 mg/dL (ref 70–99)
GLUCOSE-CAPILLARY: 129 mg/dL — AB (ref 70–99)
Glucose-Capillary: 155 mg/dL — ABNORMAL HIGH (ref 70–99)
Glucose-Capillary: 143 mg/dL — ABNORMAL HIGH (ref 70–99)

## 2013-11-30 NOTE — Progress Notes (Signed)
Patient ID: [name], male   DOB: [birth year], 80 y.o.   MRN: [id number] Facility; [facility] Chief complaint; followup chronic renal insufficiency, dehydration, hypernatremia History this is a patient I admitted to the facility 2 days ago. He had a complicated recent admission to hospital initially a prolonged admission for respiratory failure secondary to aspiration pneumonia diastolic heart failure and a non-ST elevation MI. He was then sent home but readmitted with increasing shortness of breath with a pleural effusion. He was diuresed with torsemide 20 mg twice a day IV. He came to [place] on oral torsemide 20 twice a day.  Lab work on 2/2 showed a BUN of 99 and a creatinine of 3.5 to his sodium was 153 INR of 1.99 hemoglobin of 8.82. Today his INR is 2.03, BUN of 87, creatinine of 3.57 his sodium is down to 150. His wife states he is eating and drinking well.  Review of systems Resp; oxygen on he is not complaining of shortness of breath although he is coughing  Cardiac; heart sounds are normal he still appears on the dry side of the poor skin turgor dry mucous membranes Abdomen no liver no spleen no tenderness.  Impression/plan #1 hyper natremia. His torsemide is currently on him and his sodium appears to be improving. I am going to keep this on hold and restart him on torsemide at 20 mg a day on Friday. #2 atrial fibrillation recently placed on Coumadin his INR is therapeutic I see no reason to change this dose currently at 2.5 mg #3 diastolic heart failure. He is a known EF of 55-60%. He had pleural tap related yielding 870 cc of fluid presumably transudate of this since he was diuresised.

## 2013-12-01 ENCOUNTER — Non-Acute Institutional Stay (SKILLED_NURSING_FACILITY): Payer: Medicare Other | Admitting: Internal Medicine

## 2013-12-01 ENCOUNTER — Ambulatory Visit (HOSPITAL_COMMUNITY): Payer: No Typology Code available for payment source | Attending: Internal Medicine

## 2013-12-01 DIAGNOSIS — E870 Hyperosmolality and hypernatremia: Secondary | ICD-10-CM

## 2013-12-01 DIAGNOSIS — R05 Cough: Secondary | ICD-10-CM | POA: Diagnosis present

## 2013-12-01 DIAGNOSIS — I5032 Chronic diastolic (congestive) heart failure: Secondary | ICD-10-CM

## 2013-12-01 DIAGNOSIS — J90 Pleural effusion, not elsewhere classified: Secondary | ICD-10-CM | POA: Insufficient documentation

## 2013-12-01 DIAGNOSIS — R059 Cough, unspecified: Secondary | ICD-10-CM | POA: Insufficient documentation

## 2013-12-01 DIAGNOSIS — I509 Heart failure, unspecified: Secondary | ICD-10-CM

## 2013-12-01 DIAGNOSIS — R0989 Other specified symptoms and signs involving the circulatory and respiratory systems: Secondary | ICD-10-CM | POA: Insufficient documentation

## 2013-12-01 DIAGNOSIS — I4891 Unspecified atrial fibrillation: Secondary | ICD-10-CM

## 2013-12-01 LAB — GLUCOSE, CAPILLARY
GLUCOSE-CAPILLARY: 122 mg/dL — AB (ref 70–99)
GLUCOSE-CAPILLARY: 161 mg/dL — AB (ref 70–99)
Glucose-Capillary: 86 mg/dL (ref 70–99)
Glucose-Capillary: 151 mg/dL — ABNORMAL HIGH (ref 70–99)

## 2013-12-01 NOTE — Progress Notes (Signed)
Patient ID: [name], male   DOB: November 25, 1918, 80 y.o.   MRN: [id number]   Facility; [facility]  Chief complaint; acute visit secondary to respiratory congestion-increasing weakness   History this is a patient who was admitted to this facility earlier this week. He had a complicated recent admission to hospital initially a prolonged admission for respiratory failure secondary to aspiration pneumonia diastolic heart failure and a non-ST elevation MI. He was then sent home but readmitted with increasing shortness of breath with a pleural effusion. He was diuresed with torsemide 20 mg twice a day IV. He came to [place] on oral torsemide 20 twice a day.  Lab work on 2/2 showed a BUN of 99 and a creatinine of 3.5 to his sodium was 153 INR of 1.99 hemoglobin of 8.82. TYesterday his INR -- 2.03, BUN of 87, creatinine of 3.57 his sodium was down to 150. Nursing staff states he is eating and drinking well This morning there was some concern about some increase chest congestion and an x-ray was ordered which showed a persistent atelectasis and/or consolidation in the left lower lobe with small to moderate left pleural effusion--vital signs are stable O2 saturations are stable-however nursing staff he just appears to be becoming somewhat increasingly weak He is not complaining of shortness of breath     Review of systems.  Somewhat limited since patient does not verbalize.  Gen. no fever chills noted.  Respiratory he does not complaining of shortness of breath does have a cough that is productive.  Cardiac-denies any chest pain.  GI no nausea vomiting diarrhea constipation noted does not complaining of abdominal pain.   Marland Kitchen  Physical exam T-. 98.3 pulse 76 respirations 18 blood pressure 156/84 O2 saturation is 96% on oxygen his weight is 168 this is down about 3 pounds since January 31  Gen. frail elderly male who appears quite weak but in no distress Skin warm and dry.  Eyes pupils appear  reactive Resp; --appears to have some expiratory rhonchi on the left-no labored breathing   Cardiac; heart sounds are normal he still appears on the dry side of the poor skin turgor dry mucous membranes  Abdomen no liver no spleen no tenderness Extremities-appears to have some mild lower extremity edema this does not appear to be changed Muscle-skeletall-not noted any deformities however continues to be generally weak.  Labs.  11/30/2013.  INR 2.03.  Sodium 150 potassium 4.2 BUN 87 creatinine 3.57.  He fillet second 2015.  WBC 7.3 hemoglobin 8.8 platelets 298.  Sodium 153 potassium 4.4 BUN 99 creatinine 3.52.    Impression/plan - #1-chest congestion with x-ray showing atelectasis versus consolidation--this was discussed with Dr. Leanord Hawking via phone Dr. Leanord Hawking did see him yesterday-Will start Avelox 400 mg daily x7 days-also probiotic twice a day x7 days-continues with albuterol nebulizers when necessary -- monitor closely vital signs pulse ox every 4 hours x2  And then Qshift--Robitussin 10 mL every 6 hours routine x48 hours and then when necessary x5 additional days--will need to monitor INR #2 hyper natremia. His torsemide is currently on him and his sodium appears to be improving. This will be restarted at 20 mg a day tomorrow and update BMP has been ordered for Monday.  #3 atrial fibrillation recently placed on Coumadin his INR is therapeutic - currently at 2.5 mg bid INR has been ordered for Monday as well- since he is on Avelox will check this on Saturday as well- #4 diastolic heart failure. He is a  known EF of 55-60%. He had pleural tap related yielding 870 cc of fluid presumably transudate of this since he was diuresised--x-ray continues to show a somewhat mild left pleural effusion.  [id number]

## 2013-12-02 ENCOUNTER — Non-Acute Institutional Stay (SKILLED_NURSING_FACILITY): Payer: Medicare Other | Admitting: Internal Medicine

## 2013-12-02 DIAGNOSIS — I509 Heart failure, unspecified: Secondary | ICD-10-CM

## 2013-12-02 DIAGNOSIS — I5032 Chronic diastolic (congestive) heart failure: Secondary | ICD-10-CM

## 2013-12-02 DIAGNOSIS — I4891 Unspecified atrial fibrillation: Secondary | ICD-10-CM

## 2013-12-02 DIAGNOSIS — R0989 Other specified symptoms and signs involving the circulatory and respiratory systems: Secondary | ICD-10-CM

## 2013-12-02 DIAGNOSIS — Z7901 Long term (current) use of anticoagulants: Secondary | ICD-10-CM

## 2013-12-02 LAB — GLUCOSE, CAPILLARY
Glucose-Capillary: 107 mg/dL — ABNORMAL HIGH (ref 70–99)
Glucose-Capillary: 159 mg/dL — ABNORMAL HIGH (ref 70–99)
Glucose-Capillary: 119 mg/dL — ABNORMAL HIGH (ref 70–99)
Glucose-Capillary: 161 mg/dL — ABNORMAL HIGH (ref 70–99)

## 2013-12-02 NOTE — Progress Notes (Signed)
Patient ID: Darius PallWilliam E Castillo, male   DOB: 11-21-1918, 80 y.o.   MRN: [id number]   Facility; [facility]   Chief complaint; acute visit followup respiratory congestion-increasing weakness   History this is a patient who was admitted to this facility earlier this week. He had a complicated recent admission to hospital initially a prolonged admission for respiratory failure secondary to aspiration pneumonia diastolic heart failure and a non-ST elevation MI. He was then sent home but readmitted with increasing shortness of breath with a pleural effusion. He was diuresed with torsemide 20 mg twice a day IV. He came to us on oral torsemide 20 twice a day.  Lab work on 2/2 showed a BUN of 99 and a creatinine of 3.5 to his sodium was 153 INR of 1.99 hemoglobin of 8.82. Two days ago his INR -- 2.03, BUN of 87, creatinine of 3.57 his sodium was down to 150. Nursing staff states he is eating and drinking well  Yesterday there was some concern about some increase chest congestion and an x-ray was ordered which showed a persistent atelectasis and/or consolidation in the left lower lobe with small to moderate left pleural effusion--vital signs were stable as well as O2 saturation-however there was concern about his clinical status and possibly developing pneumonia and he has been started on Avelox.  he appears to be tolerating this well he is actually more alert today more interactive says he feels better --he does not complaining of shortness of breath  H Family medical social history reviewed per admission note onFebuary second 2015  Med[facility].     Review of systems.  .  Gen. no fever chills noted--says he feels better.  Respiratory he does not complaining of shortness of breath does have a cough that is productive.  Cardiac-denies any chest pain.  GI no nausea vomiting diarrhea constipation noted does not complaining of abdominal pain.  Marland Kitchen.  Physical exam   Temperature 97.9  pulse 72 respirations 20 blood pressure 113/69 O2 saturation of been in the 90s weight is 169 this appears to be relatively stable over the past several days Gen. frail elderly male who appears quite weak but is more inter active appears somewhat stronger than yesterday  Skin warm and dry.  Eyes pupils appear reactive  Resp; --somewhat reduced breath sounds on the left but I cannot really appreciate any wheezing or rhonchi today-no labored breathing  Cardiac; heart sounds are normal regular rate and rhythm  Abdomen no liver no spleen no tenderness  Extremities-appears to have some mild lower extremity edema this does not appear to be changed  Muscle-skeletall-not noted any deformities however continues to be generally weak.   Labs.  11/30/2013.  INR 2.03.  Sodium 150 potassium 4.2 BUN 87 creatinine 3.57.  He fillet second 2015.  WBC 7.3 hemoglobin 8.8 platelets 298.  Sodium 153 potassium 4.4 BUN 99 creatinine 3.52.  Impression/plan -  #1-chest congestion with x-ray showing atelectasis versus consolidation-this appears to be somewhat improved from yesterday less congestion noted he appears to be more alert a bit stronger-continues on Avelox day 2 of 7 also is receiving Robitussin as well as nebulizers-vital signs appear to be stable  #2 hyper natremia. His torsemide was on hold but is being restarted today and his sodium appears to be improving. per most recent lab-  T update BMP has been ordered for Monday.  #3 atrial fibrillation recently placed on Coumadin his INR is therapeutic - currently at 2.5 mg--INR  on the 24th was 2.03-we'll have to keep a close eye on this since he is on Avelox Will check an INR tomorrow --  #4 diastolic heart failure. He is a known EF of 55-60%. He had pleural tap related yielding 870 cc of fluid presumably transudate of this since he was diuresised--x-ray continues to show a somewhat mild left pleural effusion.  [id number]

## 2013-12-03 ENCOUNTER — Non-Acute Institutional Stay (SKILLED_NURSING_FACILITY): Payer: Medicare Other | Admitting: Internal Medicine

## 2013-12-03 DIAGNOSIS — Z7901 Long term (current) use of anticoagulants: Secondary | ICD-10-CM

## 2013-12-03 DIAGNOSIS — I4891 Unspecified atrial fibrillation: Secondary | ICD-10-CM

## 2013-12-03 DIAGNOSIS — R0989 Other specified symptoms and signs involving the circulatory and respiratory systems: Secondary | ICD-10-CM

## 2013-12-03 DIAGNOSIS — I5033 Acute on chronic diastolic (congestive) heart failure: Secondary | ICD-10-CM

## 2013-12-03 LAB — GLUCOSE, CAPILLARY
GLUCOSE-CAPILLARY: 127 mg/dL — AB (ref 70–99)
Glucose-Capillary: 93 mg/dL (ref 70–99)
Glucose-Capillary: 135 mg/dL — ABNORMAL HIGH (ref 70–99)
Glucose-Capillary: 117 mg/dL — ABNORMAL HIGH (ref 70–99)

## 2013-12-03 NOTE — Progress Notes (Signed)
Patient ID: [name], male   DOB: 1919/06/04, 80 y.o.   MRN: [id number]   this is an acute visit.  Level of care skilled.  Facility [facility]   Chief complaint; acute visit followup respiratory congestion-increasing weakness   History this is a patient who was admitted to this facility earlier this week. He had a complicated recent admission to hospital initially a prolonged admission for respiratory failure secondary to aspiration pneumonia diastolic heart failure and a non-ST elevation MI. He was then sent home but readmitted with increasing shortness of breath with a pleural effusion. He was diuresed with torsemide 20 mg twice a day IV. He came to [place] on oral torsemide 20 twice a day.  Lab work on 2/2 showed a BUN of 99 and a creatinine of 3.5 to his sodium was 153 INR of 1.99 hemoglobin of 8.82. Three days ago his INR -- 2.03, BUN of 87, creatinine of 3.57 his sodium was down to 150. Nursing staff states he is eating and drinking well --this continues today per discussion with nursing-- Two days ago there was some concern about some increase chest congestion and an x-ray was ordered which showed a persistent atelectasis and/or consolidation in the left lower lobe with small to moderate left pleural effusion--vital signs were stable as well as O2 saturation-however there was concern about his clinical status and possibly developing pneumonia and he has been started on Avelox.  he appears to be tolerating this well however  INR today is elevated at 3.7-his Coumadin will have to be held. His vital signs continued to be stable O2 saturations 95% on chronic oxygen. Does have albuterol nebulizers every 4 hours when necessary he is also on Robitussin routine today as well as when necessary starting tomorrow  He also was weighed today and his weight was 190 however apparently there is scale variation here-his weights earlier this week have been in the high 160s and relatively stable-apparently he was  using a chair weightearlier in the week but he had a bed weight today which complicates matters since apparently his bedding was included on the weight today Could explain the weight gain   H  Family medical social history reviewed per admission note onFebuary second 2015   Medications have been reviewed per [facility].  Review of systems.  .  Gen. no fever chills noted--  Respiratory he does not complaining of shortness of breath does have a cough that is productive.  Cardiac-denies any chest pain.  GI no nausea vomiting diarrhea constipation noted does not complaining of abdominal pain. Muscle skeletal-is not complaining of joint pain but has significant weakness here.  Neurologic does not complaining of any headache or dizziness  .  Physical exam   Temp 97.0 pulse 75 respirations 20 blood pressure 100/70 O2 saturation 95% on oxygen Gen. frail elderly male who appears quite weak -he is alert and responsive this looks to be improved from 2 days ago but appears a bit weaker today than he did yesterday  Skin warm and dry.  Eyes pupils appear reactive  Resp; he does have some bilateral expiratory rhonchi coarse breath sounds-no labored breathing  Cardiac; heart sounds are normal regular rate and rhythm  Abdomen no liver no spleen no tenderness  Extremities-appears to have some mild lower extremity edema this does not appear to be changed  Muscle-skeletall-not noted any deformities however continues to be generally weak.   Labs  12/03/2013 .  INR-3.7.    11/30/2013.  INR 2.03.  Sodium 150 potassium 4.2 BUN 87 creatinine 3.57.  Feb--t second 2015.  WBC 7.3 hemoglobin 8.8 platelets 298.  Sodium 153 potassium 4.4 BUN 99 creatinine 3.52.   Impression/plan -  #1-chest congestion with x-ray showing atelectasis versus consolidation-t-continues on Avelox day 3 of 7 also is receiving Robitussin as well as nebulizers-vital signs appear to be stable still has some congestion---will write  order for routine albuterol neb treatments every 4-6 hours x48 hours--also extend routine Robitussin every 6 hours x48 hours  #2 hyper natremia. His torsemide was on hold but was restarted yesterday-  T update BMP has been ordered for Monday.  #3 atrial fibrillation recently placed on Coumadin his INR is  supra therapeutic - currently at 3.7--we'll hold his Coumadin tonight and recheck INR tomorrow #4 diastolic heart failure. He is a known EF of 55-60%. He had pleural tap related yielding 870 cc of fluid --x-ray continues to show a somewhat mild left pleural effusion. --Staff reports a significant weight gain today however I suspect this may he do to a different method of weighingas noted above  Note I did recheck patient clinically he appeared stable a bit more alert than when I initially saw him --staff also re\re weighed him and apparently there has been no significant weight gain since yesterday-I suspect this was all a scale variation clinically he does not appear to be putting on any significant edema    [id number]

## 2013-12-04 LAB — GLUCOSE, CAPILLARY
GLUCOSE-CAPILLARY: 146 mg/dL — AB (ref 70–99)
Glucose-Capillary: 93 mg/dL (ref 70–99)
Glucose-Capillary: 153 mg/dL — ABNORMAL HIGH (ref 70–99)
Glucose-Capillary: 148 mg/dL — ABNORMAL HIGH (ref 70–99)

## 2013-12-05 ENCOUNTER — Ambulatory Visit (HOSPITAL_COMMUNITY)
Admission: RE | Admit: 2013-12-05 | Discharge: 2013-12-05 | Disposition: A | Discharge status: Home / Self Care | Payer: No Typology Code available for payment source | Source: Skilled Nursing Facility | Attending: Internal Medicine | Admitting: Internal Medicine

## 2013-12-05 ENCOUNTER — Ambulatory Visit (HOSPITAL_COMMUNITY)
Admission: RE | Admit: 2013-12-05 | Discharge: 2013-12-05 | Disposition: A | Discharge status: Home / Self Care | Payer: Medicare Other | Source: Ambulatory Visit | Attending: Family Medicine | Admitting: Family Medicine

## 2013-12-05 DIAGNOSIS — J4489 Other specified chronic obstructive pulmonary disease: Secondary | ICD-10-CM | POA: Insufficient documentation

## 2013-12-05 DIAGNOSIS — R1310 Dysphagia, unspecified: Secondary | ICD-10-CM | POA: Insufficient documentation

## 2013-12-05 DIAGNOSIS — R059 Cough, unspecified: Secondary | ICD-10-CM | POA: Diagnosis not present

## 2013-12-05 DIAGNOSIS — I10 Essential (primary) hypertension: Secondary | ICD-10-CM | POA: Insufficient documentation

## 2013-12-05 DIAGNOSIS — R05 Cough: Secondary | ICD-10-CM | POA: Insufficient documentation

## 2013-12-05 DIAGNOSIS — IMO0001 Reserved for inherently not codable concepts without codable children: Secondary | ICD-10-CM | POA: Insufficient documentation

## 2013-12-05 DIAGNOSIS — J449 Chronic obstructive pulmonary disease, unspecified: Secondary | ICD-10-CM | POA: Insufficient documentation

## 2013-12-05 DIAGNOSIS — E119 Type 2 diabetes mellitus without complications: Secondary | ICD-10-CM | POA: Insufficient documentation

## 2013-12-05 LAB — GLUCOSE, CAPILLARY
GLUCOSE-CAPILLARY: 133 mg/dL — AB (ref 70–99)
GLUCOSE-CAPILLARY: 100 mg/dL — AB (ref 70–99)
Glucose-Capillary: 98 mg/dL (ref 70–99)
Glucose-Capillary: 140 mg/dL — ABNORMAL HIGH (ref 70–99)

## 2013-12-05 NOTE — Procedures (Signed)
Objective Swallowing Evaluation: Modified Barium Swallowing Study   Patient Details  Name: [name] MRN: [id number] Date of Birth: 10/02/1919  Today's Date: 12/05/2013 Time: 11:05 AM   - 11:36 AM    Past Medical History:  Past Medical History  Diagnosis Date  . Coronary artery disease   . Hypertension   . Pacemaker 05/08/2009    [employer]  . CKD (chronic kidney disease), stage III   . Complete heart block   . GERD (gastroesophageal reflux disease)   . Diastolic heart failure   . Myocardial infarction 2010  . Stroke 1998, 2005, 2009  . Shingles   . CHF (congestive heart failure)     diastolic  . COPD (chronic obstructive pulmonary disease)   . Arthritis   . Left-sided weakness   . Peripheral edema   . AAA (abdominal aortic aneurysm)     infrarenal 03/12/12- 3.7 x 3.5 cm  . Anemia   . Atrial fibrillation   . Acute respiratory failure 11/07/2013    Ventilator dependent.  . Diabetes mellitus, type 2 11/23/2013  . Pleural effusion, bilateral 11/23/2013   Past Surgical History:  Past Surgical History  Procedure Laterality Date  . Eye surgery      tear duct probing  . Permanent pacemaker insertion  05/08/2009    [employer]tr[employer]  . Hernia repair  10 yrs ago    [facility]  . Cataract extraction w/phaco  02/03/2012    Procedure: CATARACT EXTRACTION PHACO AND INTRAOCULAR LENS PLACEMENT (IOC);  Surgeon: Loraine Leriche T. Nile Nield, MD;  Location: [facility];  Service: Ophthalmology;  Laterality: Left;  CDE=29.12  . Cataract extraction w/phaco  03/23/2012    Procedure: CATARACT EXTRACTION PHACO AND INTRAOCULAR LENS PLACEMENT (IOC);  Surgeon: Loraine Leriche T. Nile Bentz, MD;  Location: [facility];  Service: Ophthalmology;  Laterality: Right;  CDE 22.98  . Nm myocar perf wall motion  06/13/2009    mild to mod ischemia basal inferior & mid inferior regions   HPI:  [name] is a 80 yo male who was referred by Dr. Leanord Hawking from the [facility] for MBSS due to increased coughing and congestion with po intake.  Pt was advanced to thin liquids and spiked a temp and was then downgraded back to nectar-thick liquids. PMH is significant for CVA with oropharyngeal dysphagia (2005). He was most recently admitted to [facility] for suspected aspiration pneumonia requiring intubatation. BSE was completed on 11/10/2013 with SLP recommendation for Barium Swallow completed on 11/10/2013 showed: No evidence of laryngeal penetration or tracheal aspiration. Presbyesophagus. No mucosal ulceration or stricture identified. Prior to that hospitalization, he was admitted to [facility] in December 2014 with suspected subcortical infarct (extension of old right CVA), however neurologically he returned to baseline.  Symptoms/Limitations Symptoms: Increased coughing and congestion with po intake, worse over course of meal. Special Tests: MBSS  Recommendation/Prognosis  Clinical Impression:   Dysphagia Diagnosis: Mild oral phase dysphagia;Mild pharyngeal phase dysphagia;Moderate pharyngeal phase dysphagia Clinical impression: [name] presents with mild oral phase and mi/mod pharyngeal phase dysphagia characterized by prolonged oral transit in setting of xerostomia, mild premature spilllage with delay in swallow initiation (significantly delayed with solids likely due to dry mouth-remains in valleculae for 10+ seconds), decreased pharyngeal pressure, and decreased closure of laryngeal vestibule resulting in penetration (trace amounts) of thins into laryngeal vestibule without sensation and pt required cueing to cough and clear (difficult for pt to cough on demand). Nectars were not penetrated but did have greater pooling/residuals left pharynx.  Pt  presented with congested cough througout the study. Pt is at risk for aspiration despite diet modifications and showed signs of fatigue over the course of the study (increase in penetration and increase in residuals), however risks are minimized with strategies. Recommend D3/mech soft  with nectar-thick liquids with cues for small sips, repeat swallow, and periodic throat clear. Trials of thin with SLP and possible upgrades as pt's endurance, strength, and voluntary cough improves.   Swallow Evaluation Recommendations:  Diet Recommendations: Dysphagia 3 (Mechanical Soft);Nectar-thick liquid Liquid Administration via: Cup Medication Administration: Whole meds with puree Supervision: Full supervision/cueing for compensatory strategies Compensations: Slow rate;Small sips/bites;Multiple dry swallows after each bite/sip;Clear throat intermittently Postural Changes and/or Swallow Maneuvers: Out of bed for meals;Seated upright 90 degrees;Upright 30-60 min after meal Oral Care Recommendations: Oral care BID;Staff/trained caregiver to provide oral care Other Recommendations: Order thickener from pharmacy;Clarify dietary restrictions Follow up Recommendations: Skilled Nursing facility    Prognosis:   Good   Individuals Consulted: Consulted and Agree with Results and Recommendations: Patient;Other (Comment) (treating SLP) Report Sent to : Facility (Comment) [facility])      SLP Assessment/Plan Clinical Impression:   Dysphagia Diagnosis: Mild oral phase dysphagia;Mild pharyngeal phase dysphagia;Moderate pharyngeal phase dysphagia Clinical impression: [name] presents with mild oral phase and mi/mod pharyngeal phase dysphagia characterized by prolonged oral transit in setting of xerostomia, mild premature spilllage with delay in swallow initiation (significantly delayed with solids likely due to dry mouth-remains in valleculae for 10+ seconds), decreased pharyngeal pressure, and decreased closure of laryngeal vestibule resulting in penetration (trace amounts) of thins into laryngeal vestibule without sensation and pt required cueing to cough and clear (difficult for pt to cough on demand). Nectars were not penetrated but did have greater pooling/residuals left pharynx.  Pt  presented with congested cough througout the study. Pt is at risk for aspiration despite diet modifications and showed signs of fatigue over the course of the study (increase in penetration and increase in residuals), however risks are minimized with strategies. Recommend D3/mech soft with nectar-thick liquids with cues for small sips, repeat swallow, and periodic throat clear. Trials of thin with SLP and possible upgrades as pt's endurance, strength, and voluntary cough improves.   Plan:          General: Date of Onset: 11/28/13 Type of Study: Modified Barium Swallowing Study Reason for Referral: Objectively evaluate swallowing function Previous Swallow Assessment: Esophagram January 2015: no evidence of laryngeal penetration/aspiration, presbyesophagus, and no mucosal ulceration or stricture identified. Diet Prior to this Study: Dysphagia 3 (soft);Nectar-thick liquids Temperature Spikes Noted: No Respiratory Status: Nasal cannula History of Recent Intubation: No Behavior/Cognition: Alert;Cooperative;Requires cueing Oral Cavity - Dentition: Adequate natural dentition Oral Motor / Sensory Function: Within functional limits Self-Feeding Abilities: Able to feed self with adaptive devices;Needs set up Patient Positioning: Upright in chair Baseline Vocal Quality: Clear Volitional Cough: Strong;Congested Volitional Swallow: Able to elicit Anatomy: Within functional limits Pharyngeal Secretions: Not observed secondary MBS   Reason for Referral:   Objectively evaluate swallowing function    Oral Phase: Oral Preparation/Oral Phase Oral Phase: Impaired Oral - Solids Oral - Puree: Weak lingual manipulation;Lingual/palatal residue;Delayed oral transit;Piecemeal swallowing Oral - Regular: Weak lingual manipulation;Lingual/palatal residue;Delayed oral transit;Piecemeal swallowing Oral Phase - Comment Oral Phase - Comment: Oral cavity was extremely dry   Pharyngeal Phase:  Pharyngeal  Phase Pharyngeal Phase: Impaired Pharyngeal - Nectar Pharyngeal - Nectar Cup: Delayed swallow initiation;Premature spillage to valleculae;Premature spillage to pyriform sinuses;Reduced pharyngeal peristalsis;Pharyngeal residue - valleculae;Pharyngeal residue - posterior pharnyx;Lateral  channel residue Pharyngeal - Thin Pharyngeal - Thin Teaspoon: Delayed swallow initiation;Premature spillage to valleculae;Premature spillage to pyriform sinuses;Reduced pharyngeal peristalsis;Pharyngeal residue - valleculae;Pharyngeal residue - posterior pharnyx;Lateral channel residue;Reduced airway/laryngeal closure Pharyngeal - Thin Cup: Delayed swallow initiation;Premature spillage to valleculae;Premature spillage to pyriform sinuses;Reduced pharyngeal peristalsis;Pharyngeal residue - valleculae;Pharyngeal residue - posterior pharnyx;Lateral channel residue;Reduced airway/laryngeal closure;Penetration/Aspiration during swallow;Trace aspiration Penetration/Aspiration details (thin cup): Material does not enter airway;Material enters airway, remains ABOVE vocal cords and not ejected out;Material enters airway, CONTACTS cords and not ejected out Pharyngeal - Solids Pharyngeal - Puree: Delayed swallow initiation;Premature spillage to valleculae;Reduced pharyngeal peristalsis;Pharyngeal residue - posterior pharnyx Pharyngeal - Mechanical Soft: Premature spillage to valleculae Pharyngeal - Regular: Premature spillage to valleculae Pharyngeal - Pill: Within functional limits (penetrated liquid, but pill passed without incident)   Cervical Esophageal Phase  Cervical Esophageal Phase Cervical Esophageal Phase: WFL    G Codes: Swallowing via MBSS: CJ, CI, CJ  Thank you,  Havery MorosDabney Helma Argyle, CCC-SLP [phone]   [name] 12/05/2013, 12:56 PM                                                     Physician Treatment Plan  Your signature is required to indicate approval of the treatment plan/progress as stated  above. Please make a copy of this report for your files and return this physician signed original in the self-addressed envelope or fax to [phone]. COMMENTS/CHANGES:__________________________________________________________________________________________________________________________   ____________________________________                      ____________________ Darius StainPHYSICIAN SIGNATURE                                                    DATE

## 2013-12-06 ENCOUNTER — Non-Acute Institutional Stay (SKILLED_NURSING_FACILITY): Payer: Medicare Other | Admitting: Internal Medicine

## 2013-12-06 DIAGNOSIS — R5381 Other malaise: Secondary | ICD-10-CM

## 2013-12-06 DIAGNOSIS — J9600 Acute respiratory failure, unspecified whether with hypoxia or hypercapnia: Secondary | ICD-10-CM

## 2013-12-06 DIAGNOSIS — J90 Pleural effusion, not elsewhere classified: Secondary | ICD-10-CM

## 2013-12-06 DIAGNOSIS — I6350 Cerebral infarction due to unspecified occlusion or stenosis of unspecified cerebral artery: Secondary | ICD-10-CM

## 2013-12-06 DIAGNOSIS — J069 Acute upper respiratory infection, unspecified: Secondary | ICD-10-CM

## 2013-12-06 DIAGNOSIS — I10 Essential (primary) hypertension: Secondary | ICD-10-CM

## 2013-12-06 DIAGNOSIS — I4891 Unspecified atrial fibrillation: Secondary | ICD-10-CM

## 2013-12-06 DIAGNOSIS — I639 Cerebral infarction, unspecified: Secondary | ICD-10-CM

## 2013-12-06 DIAGNOSIS — R5383 Other fatigue: Secondary | ICD-10-CM

## 2013-12-06 LAB — GLUCOSE, CAPILLARY
GLUCOSE-CAPILLARY: 126 mg/dL — AB (ref 70–99)
Glucose-Capillary: 109 mg/dL — ABNORMAL HIGH (ref 70–99)
Glucose-Capillary: 162 mg/dL — ABNORMAL HIGH (ref 70–99)

## 2013-12-06 NOTE — Progress Notes (Signed)
Patient ID: [name], male   DOB: Dec 29, 1918, 80 y.o.   MRN: [id number]   This is an acute visit.  Level of care skilled.  Facility [facility].  He complaint-acute visit secondary to lethargy.  History of present illness.  Patient is a pleasant elderly resident with a very complex medical history including recent hospitalization for respiratory failure secondary to aspiration pneumonia and diastolic CHF and a non-ST elevation MI.  He was sent home but readmitted with increased shortness of breath with pleural effusion -he was diuresed with torsemide 20 mg twice a day initially IV and this was switched to by mouth.  This is complicated with a history of renal insufficiency most recent creatinine on February 4 was 3.57 BUN of 87 this appears to be relatively baseline his sodium also was elevated at 153 his torsemide was put on hold-in sodium did come down to 150 and an update BMP is pending for tomorrow.  He also had increased chest congestion recently chest x-ray showed persistent atelectasis or consolidation in the left lower lobe with a small to moderate left pleural effusion--- he is completing course of Avelox.  Patient continues to have fairly good days and other days are more challenging.  Apparently earlier today he was somewhat more lethargic not speaking much and not doing well with therapy.  I did evaluate him and during the evaluation he appeared to be somewhat more responsive especially when he was repositioned to listen to his back  he became more talkative and appeared to be more at his baseline--he continues to deny any increased shortness of breath or chest pain although he appears to be very weak.  His vital signs including O2 saturations have been satisfactory he is afebrile.  His weight today 163.7 this appears to be a loss of about 5 pounds over the past several days.  Family medical social history has been reviewed per admission note on 11/28/2013.  Medications have  been reviewed per [facility] his torsemide has been restarted he also also completing a course of Avelox he will be on this until February 11.  Is Coumadin currently is on hold secondary to an INR yesterday of 3.96 and INR has been ordered for tomorrow.  Review of systems somewhat limited since patient does not speak a whole lot.  General he is a 9 any fever or chills.  Respiratory does not complaining of any increased shortness of breath does have some cough.  Cardiac does not complaining of chest pain has scant lower extremity edema.  Neurologic denies any headache or dizziness.  Physical exam.  Temperature 97.4 pulse 88 respirations 20 blood pressure 149/83 O2 saturation is 96% on oxygen his weight is 163.7  In general this is a very frail elderly male initially appeared to be somewhat lethargic but appeared to be more at his baseline by the end of exam talking although very weak.  Skin is warm and dry do not see any increased bruising or bleeding from baseline.  Eyes sclera and conjunctiva are clear pupils appear to be reactive to light.  Chest he does have coarse breath sounds more anterior lung fields-there is no labored breathing posterior lung fields there was some mild rhonchi noted at the bases on expiration      Abdomen is soft nontender with positive bowel sounds.  Extremities continues with left-sided weakness which is not new right sided strength appears to be intact has a very good grip strength has generalized weakness and frailty.  He has  scant lower extremity edema no sacral edema  Her logic as stated above left-sided weakness he does have a mild droop which is not new.  Psych-he appears to be alert and oriented and in accurate historian--continues to be pleasant although very weak  Labs.  12/05/2013.  INR 3.96.  12/04/2013.  INR 3.76.  Severe 7 2015.  INR 3.74.  Fillet fourth 2015.  Sodium 150 potassium 4.2 BUN 87 creatinine 3.57.  Feb-- second  2015.  CBC 7.3 hemoglobin 8.8 platelets 298.  Assessment and plan.  #1 left lethargy-patient appears very weak but at his baseline this afternoon he is responsive talking appears to be alert and mental status appears to be at baseline-vital signs are stable we'll continue to monitor.  Of note he does have a history of hypernatremia that appears to be trending down an update a metabolic panel is ordered for tomorrow also will obtain a CBC for followup here.  In regards to possible pneumonia he is completing a course of Avelox2 saturations are good still has some congestion--h as Robitussin as well as albuterol nebulizer certainly encouraged use of both of these will write order to make albuterol nebulizers routine every 4 hours for 48 additional hours.  Of note a bit later his nursing tech said she got a blood pressure of 145/113-however I did reassess him and checked his blood pressure manually and it was 110/70 I suspect this was more of a machine variation--from the automated blood pressure reading.  He is on diltiazem as well as Lopressor with a history of atrial fibrillation which appears to be rate controlled  Clinically he appeared to be at baseline.  [id number]    Labs.  .Marland Kitchen

## 2013-12-07 ENCOUNTER — Non-Acute Institutional Stay (SKILLED_NURSING_FACILITY): Payer: Medicare Other | Admitting: Internal Medicine

## 2013-12-07 DIAGNOSIS — E870 Hyperosmolality and hypernatremia: Secondary | ICD-10-CM

## 2013-12-07 DIAGNOSIS — N184 Chronic kidney disease, stage 4 (severe): Secondary | ICD-10-CM

## 2013-12-07 LAB — GLUCOSE, CAPILLARY
GLUCOSE-CAPILLARY: 116 mg/dL — AB (ref 70–99)
GLUCOSE-CAPILLARY: 110 mg/dL — AB (ref 70–99)
Glucose-Capillary: 111 mg/dL — ABNORMAL HIGH (ref 70–99)

## 2013-12-08 ENCOUNTER — Non-Acute Institutional Stay (SKILLED_NURSING_FACILITY): Payer: Medicare Other | Admitting: Internal Medicine

## 2013-12-08 ENCOUNTER — Encounter: Payer: Self-pay | Admitting: Internal Medicine

## 2013-12-08 DIAGNOSIS — Z7901 Long term (current) use of anticoagulants: Secondary | ICD-10-CM

## 2013-12-08 DIAGNOSIS — I5032 Chronic diastolic (congestive) heart failure: Secondary | ICD-10-CM

## 2013-12-08 DIAGNOSIS — E870 Hyperosmolality and hypernatremia: Secondary | ICD-10-CM

## 2013-12-08 LAB — GLUCOSE, CAPILLARY
GLUCOSE-CAPILLARY: 100 mg/dL — AB (ref 70–99)
GLUCOSE-CAPILLARY: 151 mg/dL — AB (ref 70–99)
GLUCOSE-CAPILLARY: 128 mg/dL — AB (ref 70–99)
Glucose-Capillary: 144 mg/dL — ABNORMAL HIGH (ref 70–99)
Glucose-Capillary: 112 mg/dL — ABNORMAL HIGH (ref 70–99)

## 2013-12-08 NOTE — Progress Notes (Signed)
Patient ID: [name], male   DOB: Dec 16, 1918, 80 y.o.   MRN: [id number]   This is an acute visit.  Level of care skilled.  Facility [facility]   Chief  complaint-acute visit secondary to lethargy.   History of present illness.  Patient is a pleasant elderly resident with a very complex medical history including recent hospitalization for respiratory failure secondary to aspiration pneumonia and diastolic CHF and a non-ST elevation MI.  He was sent home but readmitted with increased shortness of breath with pleural effusion -he was diuresed with torsemide 20 mg twice a day initially IV and this was switched to by mouth.  This is complicated with a history of renal insufficiency with baseline creatinine in the mid-threes.  Recently his sodium rose to 153 on February 2 Dr. Leanord Hawking felt his Demadex he is on this for a history of diastolic CHF and his sodium did trend down to 150 however an updated metabolic panel yesterday showed sodium had risen back to 156 with a creatinine of 4.52-Dr. Leanord Hawking had a fairly extensive discussion with the family apparently and he was started on IV half-normal saline at 100 cc an hour through tomorrow.  I'm following up on this.  His vital signs continued to be stable today however he is somewhat lethargic which he has periods of-he appears to be declining gradually.  Of note his weight today is 167.7 it scales her do believe this is a gain of about 5 pounds over the past day-however this is essentially his baseline weight with what it was on on February 8 and-he has tended to run in the high 160 s previously.  He does not really complain of any increased shortness of breath although he is somewhat of a poor historian and is not verbalizing much today.  Of note his INR also has been elevated he is on Avelox and has just finished a course of this for possible left lobe pneumonia-he does have a history of a pleural effusion here as well-  .  Family medical social  history has been reviewed per admission note on 11/28/2013.   Medications have been reviewed per [facility] his torsemide is being held and he is receiving IV fluids.  Is Coumadin currently is on hold secondary to an INR of 3.16 today-had been 5.38 yesterday his Coumadin has been on hold for several days he did receive vitamin K yesterday.    Review of systems somewhat limited since patient does not speak a whole lot.  General nofever or chills.  Respiratory does not complaining of any increased shortness of breath does have some cough.  Cardiac does not complaining of chest pain has a small amountt lower extremity edema.  Neurologic denies any headache or dizziness .  Physical exam.   Temperature 97.6 pulse 73 respirations 16 blood pressure 113/65 O2 saturation is in the high 90s on oxygen  In general this is a very frail elderly male who is somewhat lethargic but responsive does answer questions although minimally he appears to gradually be weaker .  Skin is warm and dry do not see any increased bruising or bleeding from baseline.  Eyes sclera and conjunctiva are clear pupils appear to be reactive to light.  Chest he does have coarse breath sounds more anterior lung fields-there is no labored breathing--this is baseline with previous exams posterior lung fields -respiratory effort was somewhat poor this afternoon  Abdomen is soft nontender with positive bowel sounds.  Extremities continues with left-sided weakness which  is not new right sided strength appears to be fully intact.  He has scant lower extremity edema  Neurologic as stated above left-sided weakness he does have a mild droop which is not new.  Psych-limited since patient is very weak-his granddaughter was in the room he appeared to know who she was but couldn't really verbalize her name  Labs. 12/08/2013.  INR 3.16.  12/07/2013.  CBC 9.6 hemoglobin 8.9 platelets 213.  Sodium 156 potassium 4.9 BUN 95 creatinine 4.52.      12/05/2013.  INR 3.96.  12/04/2013.  INR 3.76.  Severe 7 2015.  INR 3.74.  2-fourth 2015.  Sodium 150 potassium 4.2 BUN 87 creatinine 3.57.  Feb-- second 2015.  CBC 7.3 hemoglobin 8.8 platelets 298 .  Assessment and plan.  #1 hypernatremia-this is a challenging situation with patient's history of diastolic CHF-he has gained some weight although I do not see a grossly increased amount of edema or chest congestion-Will update weight tomorrow--notify provider gain greater than 2 pounds-at this point will continue IV fluid secondary to significant hypernatremia renal issues-patient appears to gradually be declining apparently Dr. Leanord Hawkingobson did discuss hospice care with family yesterday --apparently they are considering this at some point.  Patient's vital signs continued to be stable Will monitor with vital signs every 4 hours-he does continue on albuterol nebulizers as well again he has completed his antibiotic and he will need an updated INR tomorrow as well.  #2-history of supratherapeutic INR-this is trending down status post vitamin K-Will update INR tomorrow it is still slightly supratherapeutic however with recent elevated INRs would be hesitant to be real aggressive with Coumadin right now--his atrial fibrillation appears rate controlled on beta blocker and diltiazem .  [id number]- of note more than 25 minutes spent assessing patient-discussing his status and nursing staff-in coordinating plan of care-of note greater than 50% of time spent coordinating plan of care-I did speak with Dr. Chilton SiGreen via phone about the plan .  .Marland Kitchen

## 2013-12-09 LAB — GLUCOSE, CAPILLARY
Glucose-Capillary: 77 mg/dL (ref 70–99)
Glucose-Capillary: 103 mg/dL — ABNORMAL HIGH (ref 70–99)
Glucose-Capillary: 105 mg/dL — ABNORMAL HIGH (ref 70–99)
Glucose-Capillary: 180 mg/dL — ABNORMAL HIGH (ref 70–99)

## 2013-12-10 LAB — GLUCOSE, CAPILLARY
GLUCOSE-CAPILLARY: 121 mg/dL — AB (ref 70–99)
GLUCOSE-CAPILLARY: 103 mg/dL — AB (ref 70–99)
GLUCOSE-CAPILLARY: 159 mg/dL — AB (ref 70–99)
Glucose-Capillary: 164 mg/dL — ABNORMAL HIGH (ref 70–99)

## 2013-12-11 ENCOUNTER — Non-Acute Institutional Stay (SKILLED_NURSING_FACILITY): Payer: Medicare Other | Admitting: Internal Medicine

## 2013-12-11 DIAGNOSIS — Z7901 Long term (current) use of anticoagulants: Secondary | ICD-10-CM

## 2013-12-11 DIAGNOSIS — I4891 Unspecified atrial fibrillation: Secondary | ICD-10-CM

## 2013-12-11 DIAGNOSIS — E870 Hyperosmolality and hypernatremia: Secondary | ICD-10-CM

## 2013-12-11 DIAGNOSIS — I5032 Chronic diastolic (congestive) heart failure: Secondary | ICD-10-CM

## 2013-12-11 DIAGNOSIS — N184 Chronic kidney disease, stage 4 (severe): Secondary | ICD-10-CM

## 2013-12-11 LAB — GLUCOSE, CAPILLARY
GLUCOSE-CAPILLARY: 103 mg/dL — AB (ref 70–99)
Glucose-Capillary: 158 mg/dL — ABNORMAL HIGH (ref 70–99)
Glucose-Capillary: 140 mg/dL — ABNORMAL HIGH (ref 70–99)
Glucose-Capillary: 123 mg/dL — ABNORMAL HIGH (ref 70–99)

## 2013-12-11 NOTE — Progress Notes (Signed)
Patient ID: [name], male   DOB: 1919/04/25, 80 y.o.   MRN: [id number]   This is an acute visit.  Level of care skilled.  Facility [facility] .  Chief complaint-acute visit followup lethargy past with history of hypernatremia-chronic kidney disease-CHF History of present illness.  Patient is a pleasant elderly resident with a very complex medical history including recent hospitalization for respiratory failure secondary to aspiration pneumonia and diastolic CHF and a non-ST elevation MI.  He was sent home but readmitted with increased shortness of breath with pleural effusion -he was diuresed with torsemide 20 mg twice a day initially IV and this was switched to by mouth.  This is complicated with a history of renal insufficiency with baseline creatinine in the mid-threes.  Recently his sodium rose to 153 on February 2 Dr. Leanord Hawking held his [facility] he is on this for a history of diastolic CHF and his sodium did trend down to 150 however an updated metabolic panel ye showed sodium had risen back to 156 with a creatinine of 4.52-Dr. Leanord Hawking had a fairly extensive discussion with the family and patient  was started on IV half-normal saline at 100 cc an hour --subsequent lab on February 13 showed sodium was 149 with a creatinine of 4.63 and BUN of 96-we have been monitoring his weights in his weight appear to be trending up so his IV was reduced to 50 cc an hour with an updated lab slated for yesterday-that actually showed sodium had risen again to 151 with a creatinine of 4.71 BUN of 89.  Patient's IV was continued and lab was ordered for today which shows the sodium has improved slightly to 149 with a BUN of 85 and creatinine of 4.3.  It appeared he has been gaining weight with weight today 174.2 his baseline had been in the high 160s --on call provider did give an order to hold IV fluids today and do stat blood work in the morning including a BMP and BNP and chest x-ray  Clinically actually patient  appears to be doing a bit better he is more alert responsive talking more today than I have seen on the previous couple exam.  Patient continues to be in a very fragile status with his history of diastolic CHF complicated with significant chronic kidney disease and failure to thrive.   He does not complaining of any increased shortness of breath today.  Of note his INR also had been elevated he was on Avelox and has just finished a course of this for possible left lobe pneumonia-he does have a history of a pleural effusion here as well- His Coumadin actually was started yesterday secondary to INR coming down to 2.11-he was given 2.5 mg INR today is 2.44 -- .  Family medical social history has been reviewed per admission note on 11/28/2013 .  Medications have been reviewed per MAR  .  Review of systems somewhat limited .  General nofever or chills.  Respiratory does not complaining of any increased shortness of breath does have some cough.  Cardiac does not complaining of chest pain has some slightly increased t lower extremity edema.  Neurologic denies any headache or dizziness  .  Physical exam.   Temperature is 98.6 pulse 72 respirations 20 blood pressure 119/90 weight is 174.2. 0-2 stat-95  on O-2    In general this is a very frail elderly male who appears a  more alert and responsive today than he did a couple days ago but  still very frail   .  Skin is warm and dry do not see any increased bruising or bleeding from baseline.  Eyes sclera and conjunctiva are clear pupils appear to be reactive to light.  Chest -Shallow air entry some coarse breath sounds at the bases left greater than right there is no labored breathing--  Heart-distant heart sounds regular rate and rhythm back-- appears to have some mildly increased edema from when I last examined him-- lower legs bilaterally Abdomen is soft nontender with positive bowel sounds.  Extremities continues with left-sided weakness which  is not new right sided strength appears to be fully intact.    Neurologic as stated above left-sided weakness he does have a mild droop which is not new.  Psych-continues to be pleasant more talkative appears to have a bit more energy today than he did late last week    Labs. 12/11/2013.  INR 2.44.  Sodium 149 potassium 4.5 BUN 85 creatinine 4.3.  12/10/2013.  INR-2.11.  Sodium 151 potassium 4.9 BUN 89 creatinine 4.71  12/09/2013.  INR 2.39.  Sodium 149 potassium 4.6 BUN 96 creatinine 4.63   12/08/2013.  INR 3.16.  12/07/2013.  CBC 9.6 hemoglobin 8.9 platelets 213.  Sodium 156 potassium 4.9 BUN 95 creatinine 4.52.  12/05/2013.  INR 3.96.  12/04/2013.  INR 3.76.  Severe 7 2015.  INR 3.74.  2-fourth 2015.  Sodium 150 potassium 4.2 BUN 87 creatinine 3.57.  Feb-- second 2015.  CBC 7.3 hemoglobin 8.8 platelets 298  .  Assessment and plan.  #1 hypernatremia complicated with history of diastolic CHF-patient does appear to be gaining some weight-edema here gradually-IV fluids have been held-updated lab work has been ordered for tomorrow including a BMP and BNP as well as a chest x-ray-patient actually appears a bit more comfortable today than he did late last week-continues with significant chronic renal failure issues and hypernatremia which  appears to have improved slightly Clinically he continues to be in a very fragile state-family has expressed desires for no hospitalizations-at this point we'll continue to monitor with vital signs and pulse ox Will hold the IV fluids.--And await followup lab work continue to monitor weight closely  #2-atrial fibrillation this appears rate controlled on Lopressor and diltiazem-INR had been supratherapeutic but did normalize he has been restarted on Coumadin however INR is trending up again we'll reduce the dose from 2-1/2 mg a day to-2 milligrams and recheck this tomorrow-    .  #  .  [id number]   .  .Marland Kitchen

## 2013-12-12 ENCOUNTER — Encounter: Payer: Self-pay | Admitting: Internal Medicine

## 2013-12-12 ENCOUNTER — Ambulatory Visit (HOSPITAL_COMMUNITY): Payer: Medicare Other | Attending: Internal Medicine

## 2013-12-12 ENCOUNTER — Non-Acute Institutional Stay (SKILLED_NURSING_FACILITY): Payer: Medicare Other | Admitting: Internal Medicine

## 2013-12-12 DIAGNOSIS — Z7901 Long term (current) use of anticoagulants: Secondary | ICD-10-CM

## 2013-12-12 DIAGNOSIS — I4891 Unspecified atrial fibrillation: Secondary | ICD-10-CM

## 2013-12-12 DIAGNOSIS — I5032 Chronic diastolic (congestive) heart failure: Secondary | ICD-10-CM

## 2013-12-12 DIAGNOSIS — E870 Hyperosmolality and hypernatremia: Secondary | ICD-10-CM

## 2013-12-12 DIAGNOSIS — J90 Pleural effusion, not elsewhere classified: Secondary | ICD-10-CM | POA: Insufficient documentation

## 2013-12-12 DIAGNOSIS — R059 Cough, unspecified: Secondary | ICD-10-CM | POA: Insufficient documentation

## 2013-12-12 DIAGNOSIS — R05 Cough: Secondary | ICD-10-CM | POA: Insufficient documentation

## 2013-12-12 DIAGNOSIS — N184 Chronic kidney disease, stage 4 (severe): Secondary | ICD-10-CM

## 2013-12-12 LAB — GLUCOSE, CAPILLARY
GLUCOSE-CAPILLARY: 80 mg/dL (ref 70–99)
GLUCOSE-CAPILLARY: 80 mg/dL (ref 70–99)
GLUCOSE-CAPILLARY: 161 mg/dL — AB (ref 70–99)
Glucose-Capillary: 210 mg/dL — ABNORMAL HIGH (ref 70–99)

## 2013-12-12 NOTE — Progress Notes (Signed)
Patient ID: [name], male   DOB: 11-13-1918, 80 y.o.   MRN: [id number]   this is an acute visit.  Level of care skilled.  Facility [facility].  Chief complaint-acute visit followup hypernatremia-with history of diastolic CHF and chronic kidney disease.  History of present illness.  Patient is a pleasant 80 year old male with a very complex medical history including recent hospitalization for respiratory failure secondary to aspiration pneumonia and diastolic CHF and a non-ST MI.  Initially he was sent home but readmitted with shortness of breath with a pleural effusion that was diureses torsemide-this is complicated with a history of renal insufficiency with his baseline creatinine in the mid-threes.  Recently his sodium rose to the low 150s and his Demadex was held-he is on diuretic for a history of diastolic CHF.  He has been on IV fluids and his sodium did start trending down however over the weekend he did start to gain some weight and edema approximately 5 pounds and his IV was held yesterday and update the labs were done today which shows his sodium is going back up at 153.  His creatinine also was 4.17 it was 4.63 yesterday this has been somewhat elevated from his baseline as well.  A chest x-ray also was ordered which shows increased atelectasis versus consolidation and pleural effusion at left lung base apparently this has increased since the previous x-ray about 10 days   weight is stable with yesterday's weight.  He recently did complete a course of antibiotics for ? consolidation   His Coumadin also was restarted secondary to his history of atrial fibrillation he is on 2 mg and INR is steadily going up currently 2.9 I did discuss this with Dr. Leanord Hawking via phone and we will discontinue the Coumadin-since this has been quite a challenge to regulate recently and at this point would be of the opinion that the risk outweighs the benefit.  With his numerous  comorbidities.  Clinically he appears to be stable his blood pressure is somewhat low I got 95/60 manually-he is on numerous agents including Cardizem 180 a day Cardura 2 mg a day Lopressor 50 mg twice a day and hydralazine 25 mg 3 times a day.  He is alert he clinically appears to be somewhat improved from late last week he is more responsive although again an extremely frail fragile individual-family did express desires for no hospitalization over the weekend.  Family medical social history has been reviewed per admission note on 11/28/2013.  Medications have been reviewed per MAR.  Review of systems this is somewhat limited.  Gen. he does not complaining of any shortness of breath fever or chills.  Cardiac does not complaining of chest pain.  Respiratory as stated above does not complaining of shortness of breath has occasional cough very  Neurologic is not complaining of any headache or dizziness does have significant weakness.  Physical exam.  Temperature is 98.6 pulse 59 respirations 18 blood pressure 95/60 taken manually weight is 174.2.--O2 saturations continued to be in the 90s on O-2  In general this is a very frail elderly male he is alert and responsive talking and friendly which is his baseline.  His skin is warm and dry he has some chronic bruising he has some edema of his left arm which is not new.  Oropharynx is clear mucous membranes appear fairly moist.  His chest there is no rhonchi rales or wheezes no labored breathing he does have reduced breath sounds especially left lobes.  Heart somewhat distant heart sounds from what I could hear was regular rate and rhythm and has some mild lower extremity edema this does not appear to be increased from yesterday.  His abdomen soft nontender with positive bowel sounds.  Extremities has left-sided weakness this is not new.  Psych he is pleasant talkative alert although extremely weak which limits him on amount  talking  he is able to do  Labs.  12/12/2013.  INR 2.91.  Sodium 142 potassium 4.5 BUN 86 creatinine 4.17.  12/11/2013.  INR-2.39.  Sodium 149 potassium 4.6 BUN 99 creatinine 4.63.  Assessment and plan.  #1-history of diastolic CHF complicated with hypernatremia and chronic kidney disease-this is a extremely challenging situation family has made patient no hospitalization-prognosis here remains a quite poor-he does not appear to be in any distress which is encouraging he is alert responsive.  This was discussed with Dr. Leanord Hawkingobson via phone at this point we'll continue to monitor-unfortunately being more aggressive with IVs I suspect will exasperate his CHF issues-at this point he appears to be relatively comfortable and will have to be monitored closely.  In regards to somewhat low blood pressures will discontinue his hydralazine.  Also in regards to his anticoagulation management since patient's Coumadin has been quite difficult to manage we will discontinue it this is complicated with his numerous comorbidities- this was discussed with Dr. Leanord Hawkingobson via phone as well.  At this point continue to monitor vital signs closely with pulse ox every 4 hours-he does have nebulizer treatments.  Also will update a metabolic panel tomorrow.  [id number]

## 2013-12-12 NOTE — Progress Notes (Signed)
Patient ID: [name], male   DOB: 10/05/1919, 80 y.o.   MRN: [id number] Facility: [facility] Chief complaint: deteriorating condition;  History; this is a gentleman who originally came to [place] after a protracted stay at [facility] from 12/20 through 1/27. He had acute respiratory failure secondary to aspiration pneumonia, diastolic heart failure, and a non-ST elevation MI. 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 He left the hospital with a sodium of 147 and we stopped his diuretic at that point. I then resumed his diuretic at 20 mg a day. It would appear that his baseline creatinine is 3.50 with a BUN of 99.   Lab work from today shows a sodium of 156, BUN of 95 and a creatinine of 4.52. In addition his INR continues to be elevated at 5.38 in spite of the fact he has received no Coumadin since 2/7.  Review of systems; really not possible from the patient the day however staff report he is eating and drinking minimally. He is listless and having difficulty swallowing.   Physical examination Gen. the patient really has declined in the 10 days to 2 weeks that I last saw him. He is leaning off to the left and not really verbalizing Respiratory; he is not in any overt distress however I believe he has some degree of bronchial breathing at the base. Cardiac; heart sounds are distant. He had does appear to be dehydrated. Abdomen; mildly distended spleen or tenderness is noted  Impression/plan #1 hypernatremia; #2 acute on chronic renal failure probably secondary to prerenal factors #3 his elevated INR probably reflects advanced starvation #4 end-of-life issues. I did discuss things with his wife, although she expressed understanding about the gravity of his condition she wanted [place] to try and push on apparently daughter is out of town. He is no code  I started him on half normal saline at  100 cc an hour and I'll see how he responds to this.

## 2013-12-13 LAB — GLUCOSE, CAPILLARY
Glucose-Capillary: 82 mg/dL (ref 70–99)
Glucose-Capillary: 103 mg/dL — ABNORMAL HIGH (ref 70–99)
Glucose-Capillary: 182 mg/dL — ABNORMAL HIGH (ref 70–99)
Glucose-Capillary: 151 mg/dL — ABNORMAL HIGH (ref 70–99)

## 2013-12-14 ENCOUNTER — Non-Acute Institutional Stay (SKILLED_NURSING_FACILITY): Payer: Medicare Other | Admitting: Internal Medicine

## 2013-12-14 DIAGNOSIS — E870 Hyperosmolality and hypernatremia: Secondary | ICD-10-CM

## 2013-12-14 DIAGNOSIS — N184 Chronic kidney disease, stage 4 (severe): Secondary | ICD-10-CM

## 2013-12-14 DIAGNOSIS — I5032 Chronic diastolic (congestive) heart failure: Secondary | ICD-10-CM

## 2013-12-14 LAB — GLUCOSE, CAPILLARY
GLUCOSE-CAPILLARY: 96 mg/dL (ref 70–99)
Glucose-Capillary: 87 mg/dL (ref 70–99)
Glucose-Capillary: 167 mg/dL — ABNORMAL HIGH (ref 70–99)
Glucose-Capillary: 132 mg/dL — ABNORMAL HIGH (ref 70–99)

## 2013-12-14 NOTE — Progress Notes (Signed)
Patient ID: [name], male   DOB: 09/08/1919, 80 y.o.   MRN: [id number] Facility: [facility] Chief complaint: deteriorating condition;  History; this is a gentleman who originally came to [place] after a protracted stay at [facility] from 12/20 through 1/27. He had acute respiratory failure secondary to aspiration pneumonia, diastolic heart failure, and a non-ST elevation MI. 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 He left the hospital with a sodium of 147 and we stopped his diuretic at that point. I then resumed his diuretic at 20 mg a day. It would appear that his baseline creatinine is 3.50 with a BUN of 99.   Lab work from 2/11 shows a sodium of 156, BUN of 95 and a creatinine of 4.52. We gave him one half normal saline. And by 2/13 the sodium was 149, BUN 96, creatinine 4.63. There has also been an increase in his weight and today's lab values were , BUN of 78, creatinine of 3.95  Review of systems; really not possible from the patient the day however staff report he is eating and drinking minimally. He is listless and having difficulty swallowing.   Physical examination Gen. the patient  is much improved from that I saw him last week. He is more alert response Respiratory; he is not in any overt distress however I believe he has some degree of bronchial breathing at the base. Cardiac; heart sounds are distant. He had does appear to be dehydrated and appears euvolemic Abdomen; mildly distended spleen or tenderness is noted line Extremities; minimal edema  Impression/plan #1 hypernatremia; #2 acute on chronic renal failure probably secondary to prerenal factors. I will correct the serum sodium with D5W. The patient appears much better although he is still eating and drinking poorly #3 his elevated INR probably reflects advanced starvation. I have taken him off his anticoagulation at  this point although I may need to revisit this #4 end-of-life issues. I had another extensive discussion with his wife today about end-of-life issues. He is already a no code at the issues of whether we can have him under hospice care in the facility. The wife states she could not take him home  I started him on D5W. Repeat his base metabolic panel. At that point a small dose of diuretic will be likely

## 2013-12-15 LAB — GLUCOSE, CAPILLARY
GLUCOSE-CAPILLARY: 108 mg/dL — AB (ref 70–99)
GLUCOSE-CAPILLARY: 143 mg/dL — AB (ref 70–99)
Glucose-Capillary: 193 mg/dL — ABNORMAL HIGH (ref 70–99)
Glucose-Capillary: 247 mg/dL — ABNORMAL HIGH (ref 70–99)
Glucose-Capillary: 93 mg/dL (ref 70–99)

## 2013-12-16 LAB — GLUCOSE, CAPILLARY
GLUCOSE-CAPILLARY: 77 mg/dL (ref 70–99)
Glucose-Capillary: 74 mg/dL (ref 70–99)
Glucose-Capillary: 89 mg/dL (ref 70–99)

## 2013-12-17 LAB — GLUCOSE, CAPILLARY
Glucose-Capillary: 86 mg/dL (ref 70–99)
Glucose-Capillary: 94 mg/dL (ref 70–99)
Glucose-Capillary: 125 mg/dL — ABNORMAL HIGH (ref 70–99)
Glucose-Capillary: 142 mg/dL — ABNORMAL HIGH (ref 70–99)

## 2013-12-18 ENCOUNTER — Encounter: Payer: Self-pay | Admitting: Internal Medicine

## 2013-12-18 ENCOUNTER — Non-Acute Institutional Stay (SKILLED_NURSING_FACILITY): Payer: Medicare Other | Admitting: Internal Medicine

## 2013-12-18 DIAGNOSIS — T17908A Unspecified foreign body in respiratory tract, part unspecified causing other injury, initial encounter: Secondary | ICD-10-CM

## 2013-12-18 DIAGNOSIS — R627 Adult failure to thrive: Secondary | ICD-10-CM

## 2013-12-18 LAB — GLUCOSE, CAPILLARY
GLUCOSE-CAPILLARY: 83 mg/dL (ref 70–99)
GLUCOSE-CAPILLARY: 128 mg/dL — AB (ref 70–99)
Glucose-Capillary: 89 mg/dL (ref 70–99)

## 2013-12-18 MED ORDER — ATROPINE SULFATE 1 % OP SOLN: 2.0000 [drp] | Freq: Every day | OPHTHALMIC | Status: AC

## 2013-12-18 MED ORDER — ALBUTEROL SULFATE HFA 108 (90 BASE) MCG/ACT IN AERS: 2.0000 | INHALATION_SPRAY | RESPIRATORY_TRACT | Status: AC | PRN

## 2013-12-18 NOTE — Progress Notes (Signed)
Patient ID: [name], male   DOB: 04-02-1919, 80 y.o.   MRN: [id number]   This is an acute visit.  Level of care skilled.  Facility [facility].  Chief complaint-acute visit secondary to likely aspiration.  History of present illness.  Patient is a pleasant elderly resident with a very complex medical history including hospitalization for respiratory failure secondary to aspiration pneumonia complicated with diastolic CHF non-ST elevation MI-most recently was admitted to the hospital with shortness of breath with pleural effusions that required diuresis with torsemide this was complicated with severe renal lucency with a baseline creatinine in the mid-threes.  Patient has continued to gradually decline and family has expressed wishes for no hospitalization.  He is also had issues with elevated sodium and recently received another course of IV fluids he's completed this and lab done on February 20 showed sodium had come down to 147 had been 153 on February 18 his creatinine also slightly improved from previous 3.87--he had been on Demadex but this was discontinued secondary to his renal issues  This afternoon however patient took a turn for the worse with sudden onset of congestion thought to be most likely aspiration related he appeared to be somewhat uncomfortable he was suctioned and nebulizer treatments were given this did increase his O2 sats from the 70s to the 90s oxygen was increased to 6 L as well.  Currently he appears to be in moderate distress with use of accessory muscles and significant congestion  Family medical social history is reviewed most recently 12/14/2013.  Medications have been reviewed per MAR.  Review of systems.  Unattainable   Physical exam.  Temperature 97.6 pulse 80 respirations 26 blood pressure 161/86 O2 saturation did rise up to 96% on 6 L  In general this is a very frail elderly male who appears to be in mild/moderate distress.  His skin is warm and  dry.  Chest has diffuse inspiratory and expiratory congestion with some use of accessory muscles.  Heart distant heart sounds secondary to the chest congestion.  Abdomen is soft does not appear to be acutely tender with positive bowel sounds.  Muscle skeletal does have some mild edema this does not appear to be grossly changed from baseline.  Psych-patient normally is alert and oriented again is just able to speak fairly minimally secondary to respiratory effort.  Labs.  12-16-2013  Sodium 147 potassium 5.3 BUN 69 creatinine 3.87  12/07/2013.  WBC 9.6 hemoglobin 8.9 platelets 213.  Sodium 146 potassium 4.9 BUN 95 creatinine 4.52.  Plan.  #1 respiratory distress with most likely aspiration-I did speak with his daughter who is at bedside nursing staff also  talked with his other family as well as wife- essentially want Comfort Care - no hospitalization-- at this point patient appears to be entering most likely terminal stage-emphasis will be on comfort Will start Roxanol 5 mg every 2 hours when necessary-also atropine drops sublingual every 4 hours this will hopefully help with secretions-continue oxygen to keep sats above 90%. And albuterol nebulizers every 2 hours routine-as well as continue suctioning.  I did discuss this plan with his daughter who is at bedside and they are in agreement with this.  [id number]

## 2013-12-19 ENCOUNTER — Non-Acute Institutional Stay (SKILLED_NURSING_FACILITY): Payer: Medicare Other | Admitting: Internal Medicine

## 2013-12-19 DIAGNOSIS — I5032 Chronic diastolic (congestive) heart failure: Secondary | ICD-10-CM

## 2013-12-19 DIAGNOSIS — N184 Chronic kidney disease, stage 4 (severe): Secondary | ICD-10-CM

## 2013-12-19 LAB — GLUCOSE, CAPILLARY
GLUCOSE-CAPILLARY: 85 mg/dL (ref 70–99)
GLUCOSE-CAPILLARY: 100 mg/dL — AB (ref 70–99)
Glucose-Capillary: 109 mg/dL — ABNORMAL HIGH (ref 70–99)

## 2013-12-19 NOTE — Progress Notes (Signed)
Patient ID: [name], male   DOB: 1919/06/28, 80 y.o.   MRN: [id number] Facility; [facility] History; this is a patient with end-stage chronic renal failure. Recently has been the dehydrated and received some free fluid however lab work from today shows a sodium of 151 BUN 67 and a creatinine of 4.12 this is gradually increasing from values last week at which time he was receiving fluid. Over the weekend and comfort care measures were initiated he apparently is not eating and drinking. There is some respiratory distress.  Review of systems; not possible from the patient however he is eating and drinking poorly. When he is awake enough to ask he does not complain of pain  Impression/plan #1 acute on chronic renal failure, probable aspiration pneumonitis. I'm going to change his Roxanol to routine every 4 hours and every 2 when necessary. Albuterol nebulizers were ordered are appropriate. I spoke to the daughter at length about hospice care and I've asked the social workers to [employer] him about this.

## 2013-12-25 DEATH — deceased

## 2015-11-19 IMAGING — CT CT HEAD W/O CM
1 series · 15 of 30 positions shown, 19 images · non-contrast
Comparison: 08/03/2011

CLINICAL DATA: Dizziness, weakness

EXAM:
CT HEAD WITHOUT CONTRAST
TECHNIQUE: Contiguous axial images were obtained from the base of the skull
through the vertex without intravenous contrast.

[Series 2: headseq 4.8 h37s · axial · 0.44mm/px · z∈[+1211,+1366]mm · 15 of 36 slices shown, 19 images]
[im 2/36  brain]
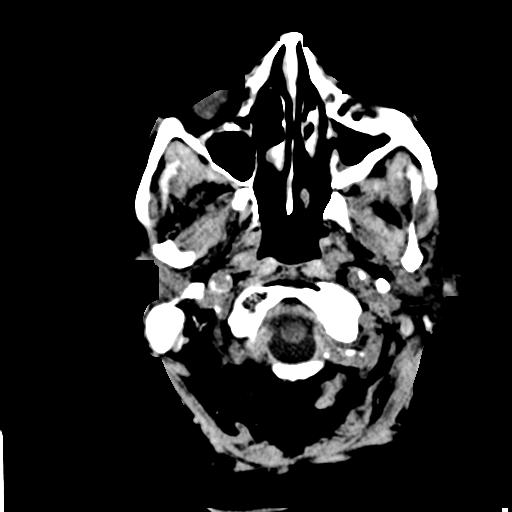
[im 2/36  bone]
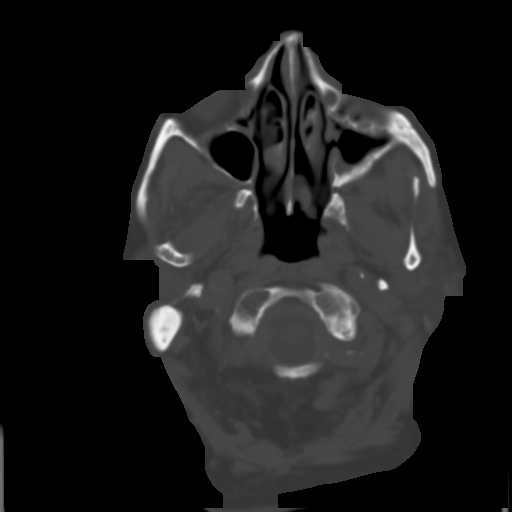
[im 4/36  brain]
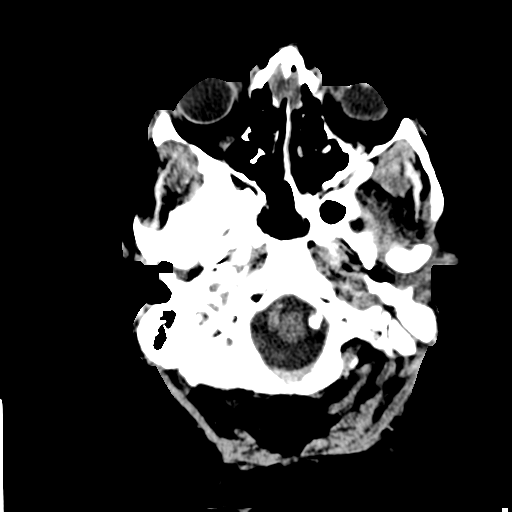
[im 7/36  brain]
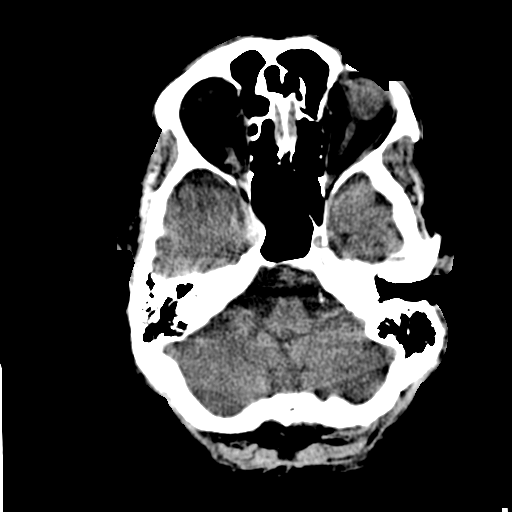
[im 9/36  brain]
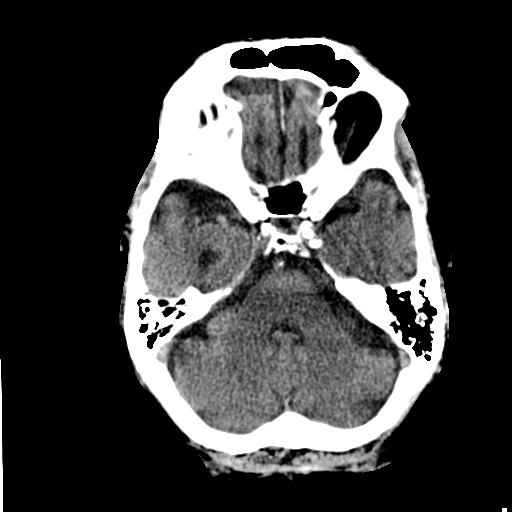
[im 11/36  brain]
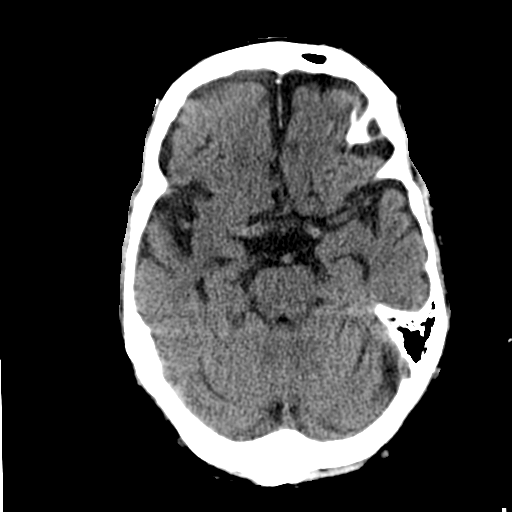
[im 11/36  bone]
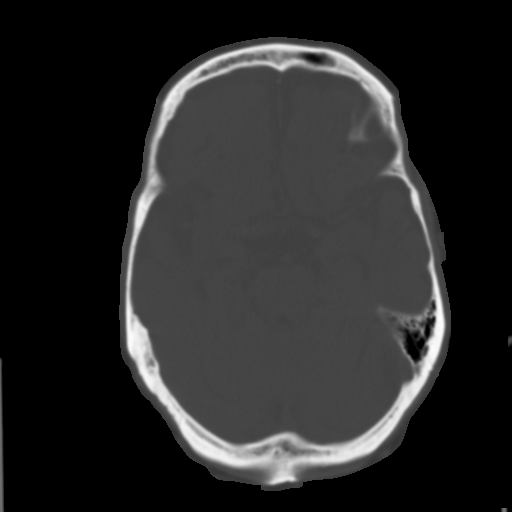
[im 14/36  brain]
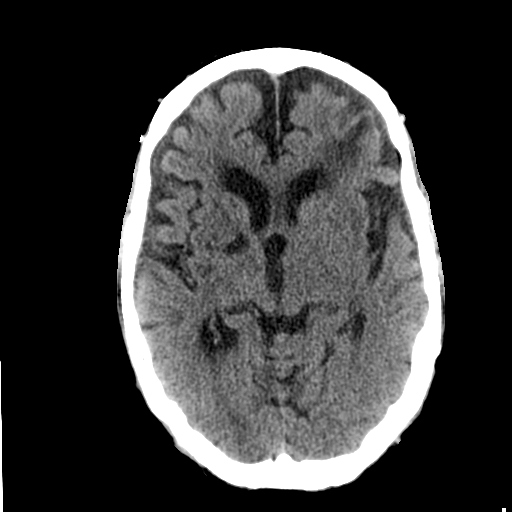
[im 16/36  brain]
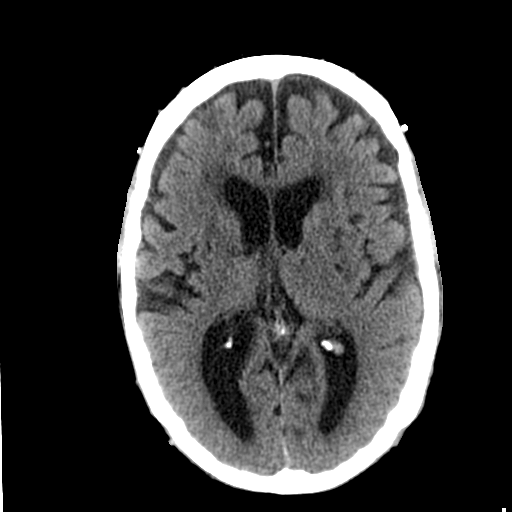
[im 19/36  brain]
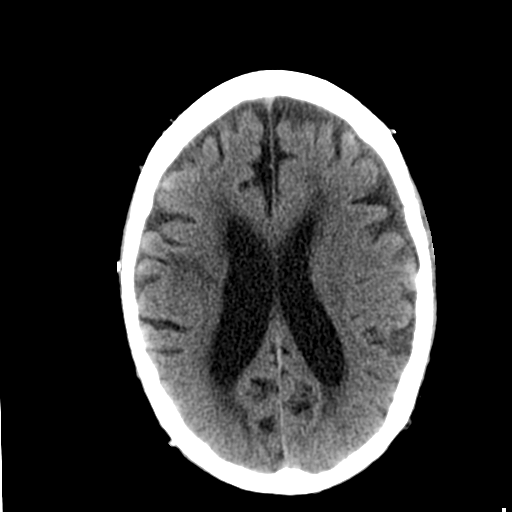
[im 20/36  brain]
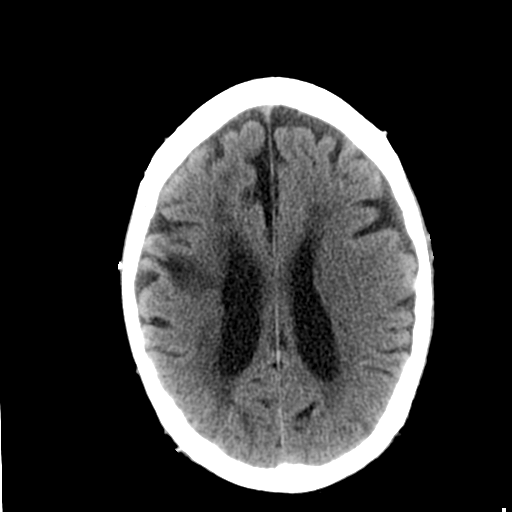
[im 20/36  bone]
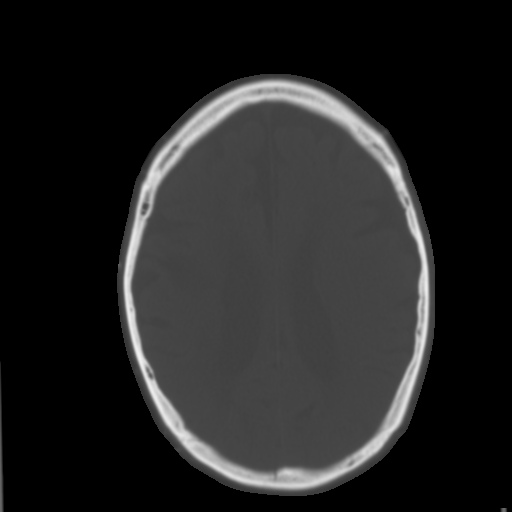
[im 22/36  brain]
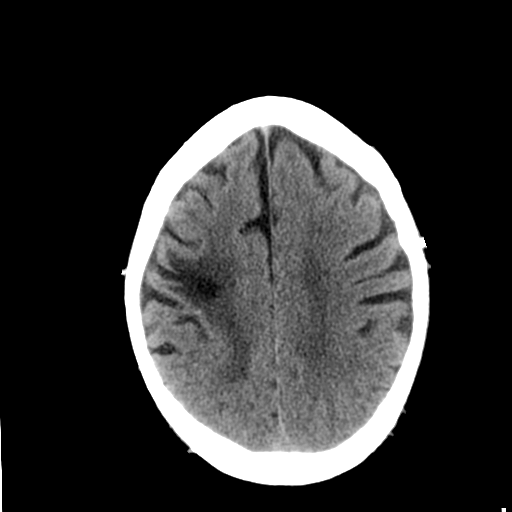
[im 25/36  brain]
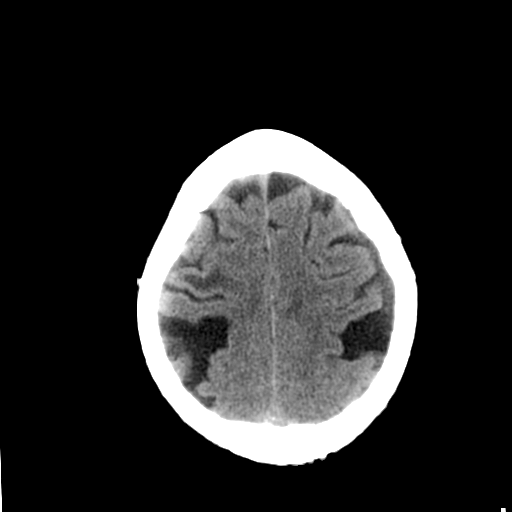
[im 27/36  brain]
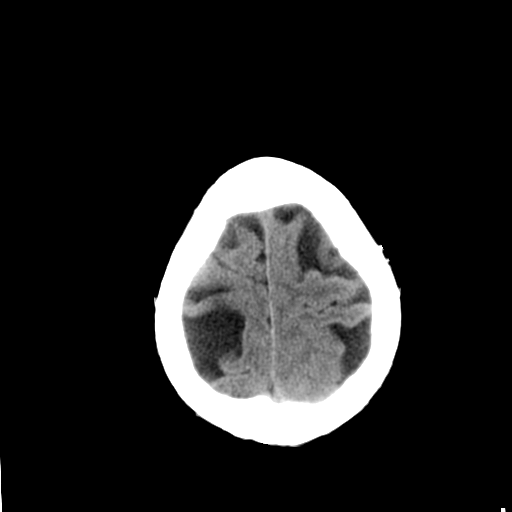
[im 29/36  brain]
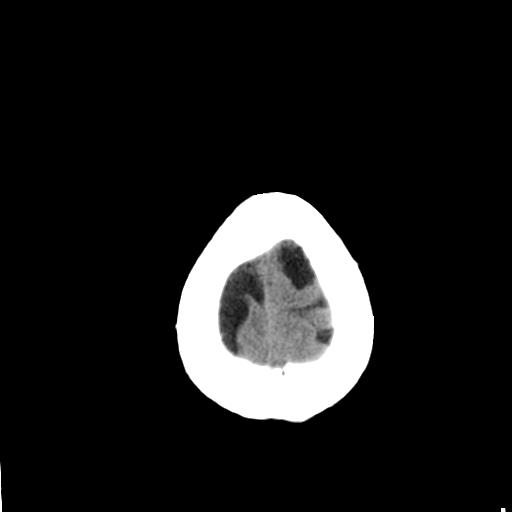
[im 29/36  bone]
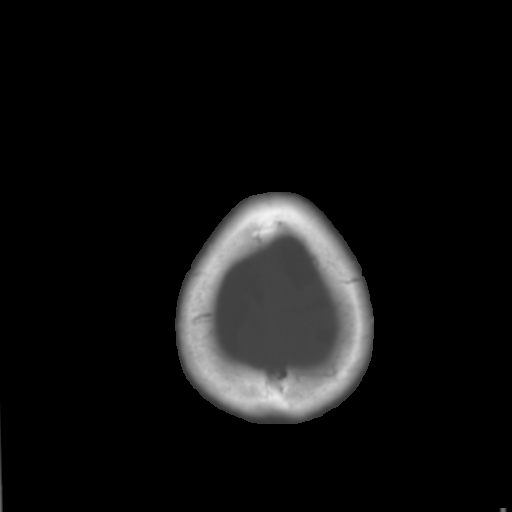
[im 32/36  brain]
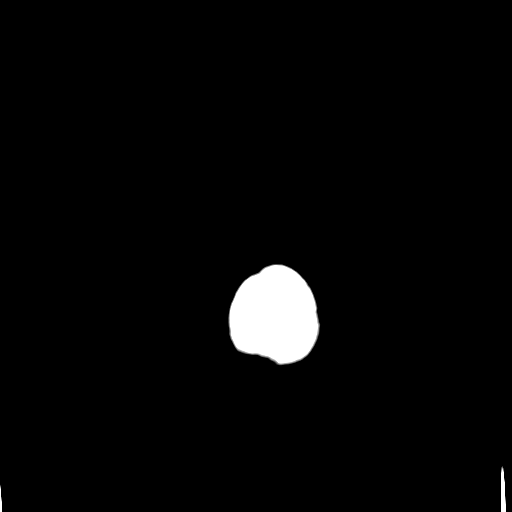
[im 34/36  brain]
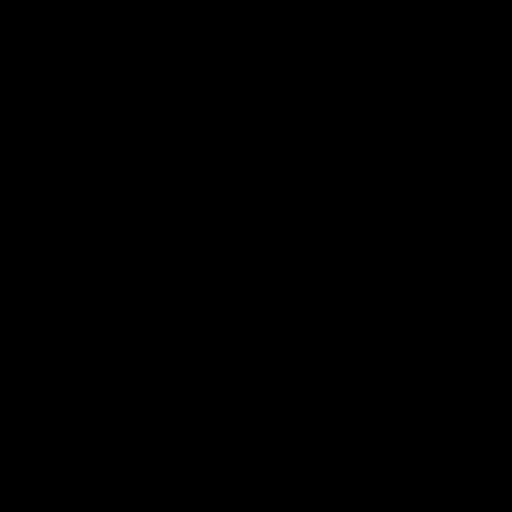

[15 of 30 positions shown; findings below may reference images not displayed]

FINDINGS: No skull fracture is noted. No intracranial hemorrhage, mass effect
or midline shift. There is mucosal thickening left maxillary sinus.
The mastoid air cells are unremarkable. Stable cerebral atrophy.
Periventricular and patchy subcortical white matter decreased
attenuation consistent with chronic small vessel ischemic changes.
Again noted encephalomalacia due to old infarcts bilateral frontal
lobe, right parietal lobe. There is interval lacunar infarct in
right basal ganglia of indeterminate age. No acute cortical
infarction. No mass lesion is noted on this unenhanced scan.
IMPRESSION: No acute intracranial abnormality. Stable atrophy and chronic white
matter disease.Again noted encephalomalacia due to old infarcts
bilateral frontal lobe, right parietal lobe. There is interval
lacunar infarct in right basal ganglia of indeterminate age. No
acute cortical infarction.

## 2015-11-19 IMAGING — US US CAROTID DUPLEX BILAT
1 series · 13 of 24 positions shown · non-contrast
Comparison: August 04, 2011

CLINICAL DATA: Dizziness; evidence of prior CVA

EXAM:
BILATERAL CAROTID DUPLEX ULTRASOUND
TECHNIQUE: Gray scale imaging, color Doppler and duplex ultrasound were
performed of bilateral carotid and vertebral arteries in the neck.

[Series 1: us carotid duplex bilat · 0.06mm/px · 13 of 78 slices shown]
[im 1/78]
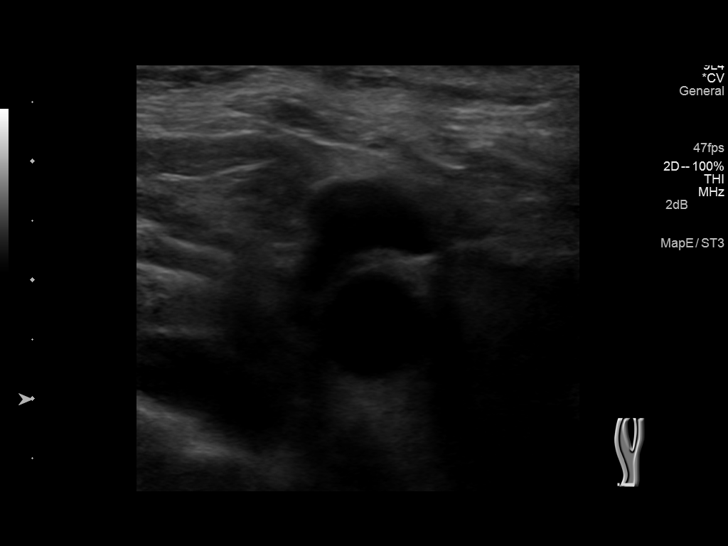
[im 7/78]
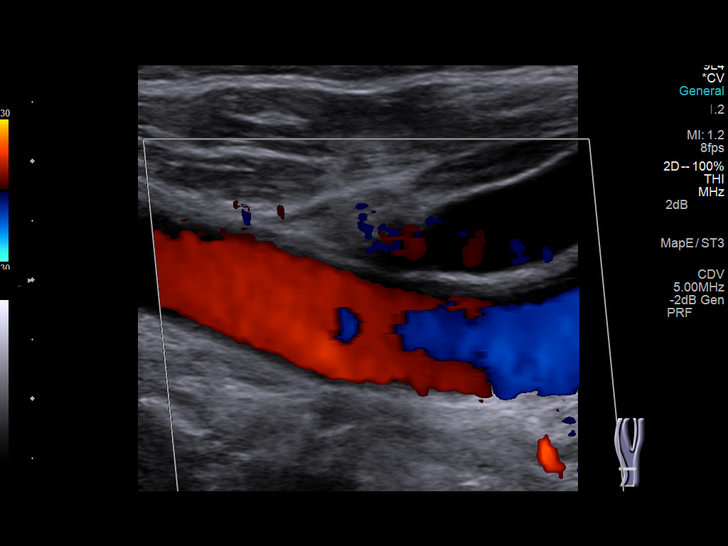
[im 14/78]
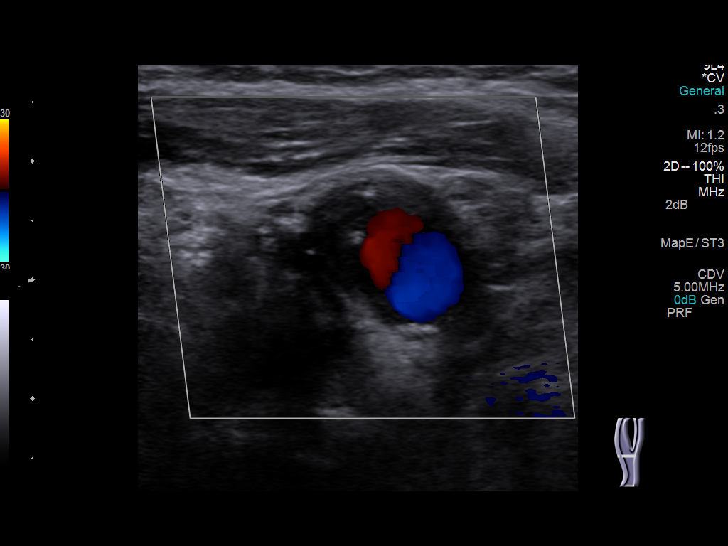
[im 21/78]
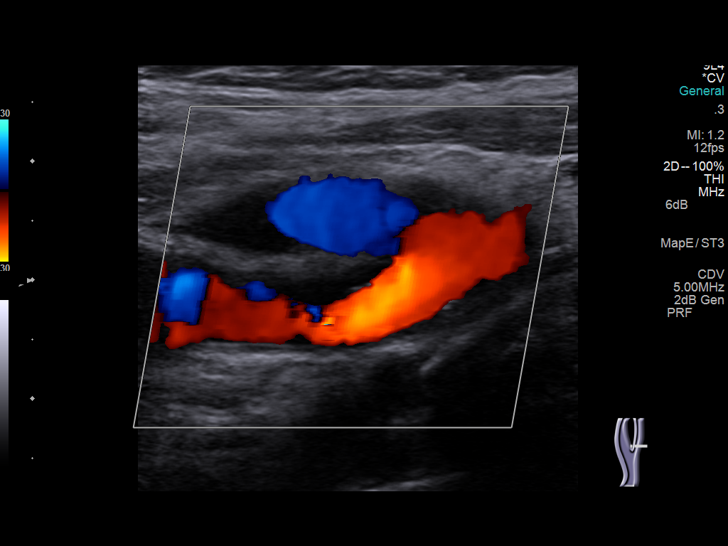
[im 27/78]
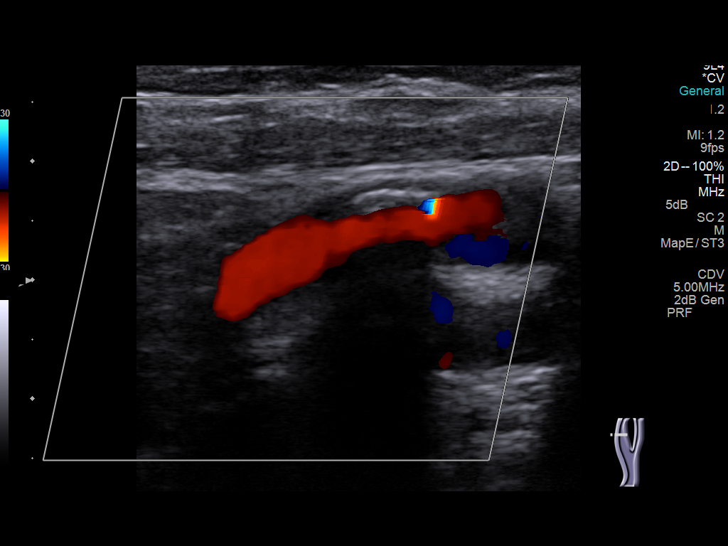
[im 34/78]
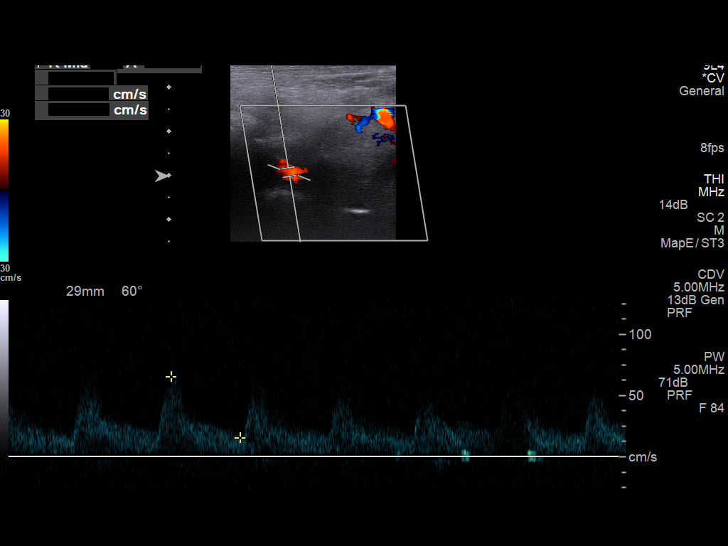
[im 41/78]
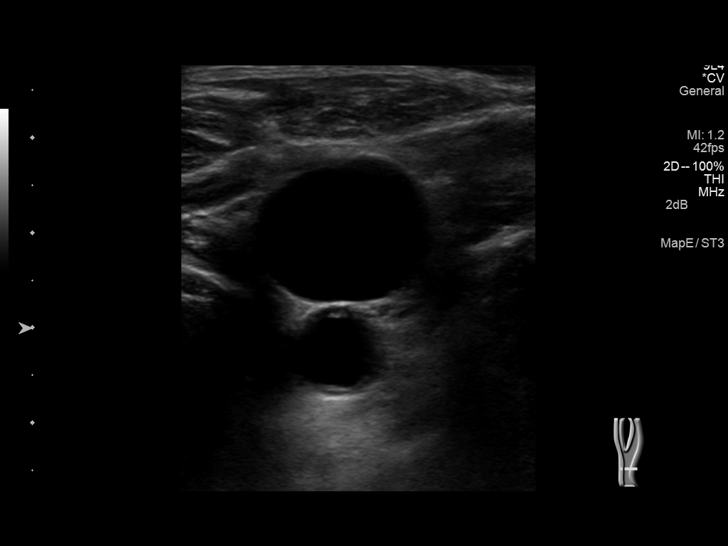
[im 44/78]
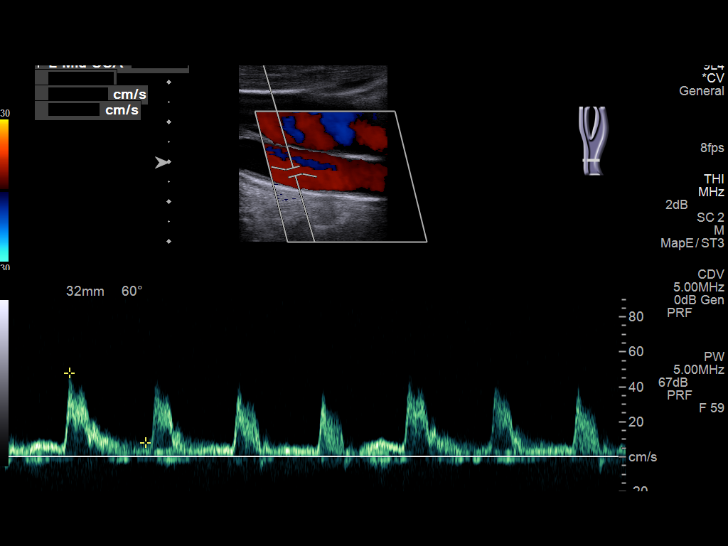
[im 51/78]
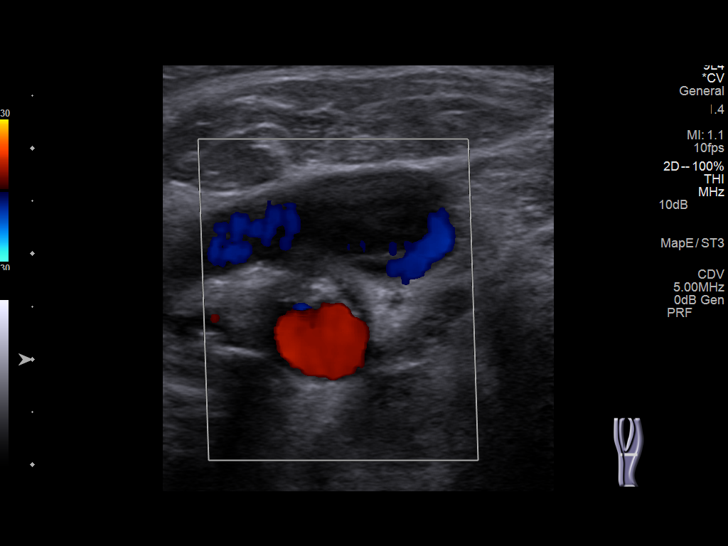
[im 57/78]
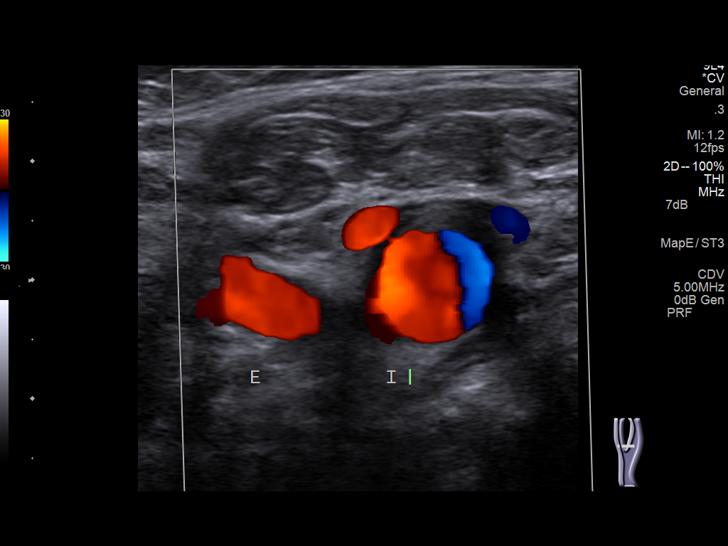
[im 64/78]
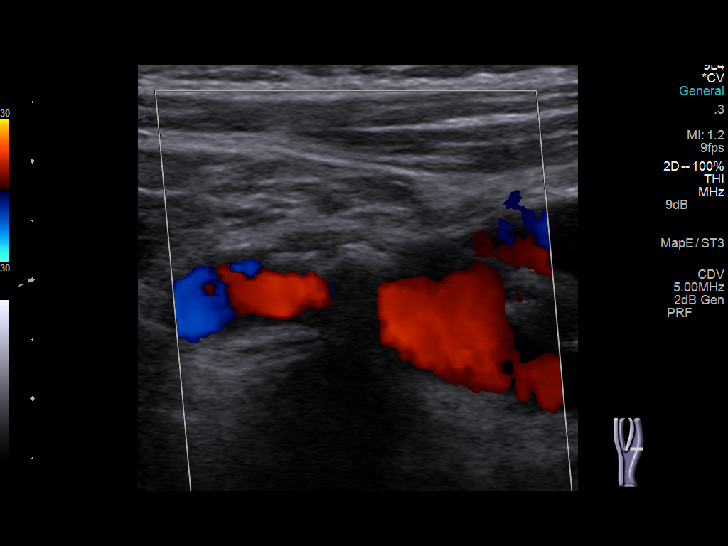
[im 71/78]
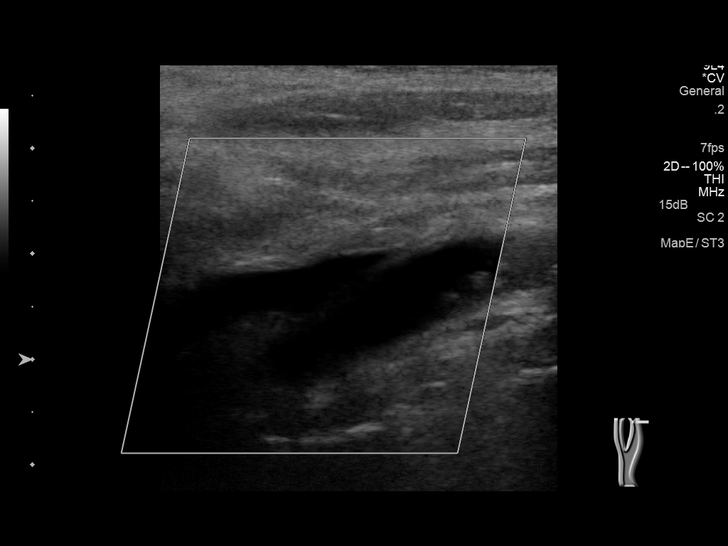
[im 78/78]
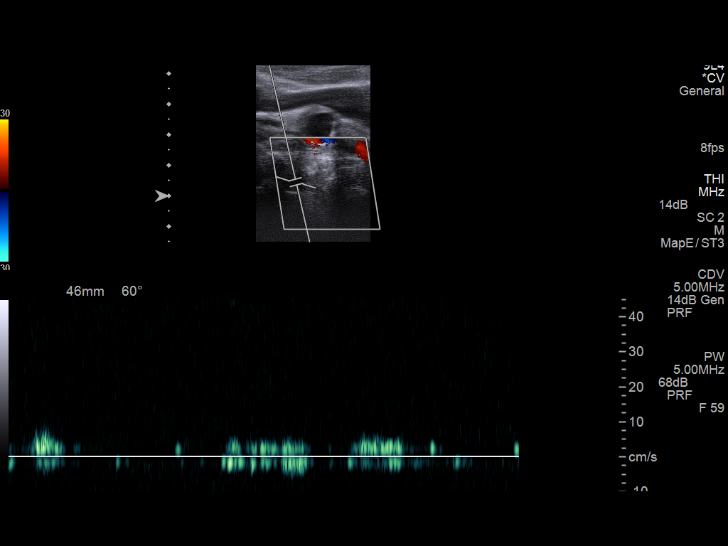

[13 of 24 positions shown; findings below may reference images not displayed]

FINDINGS: Criteria: Quantification of carotid stenosis is based on velocity
parameters that correlate the residual internal carotid diameter
with NASCET-based stenosis levels, using the diameter of the distal
internal carotid lumen as the denominator for stenosis measurement.

The following velocity measurements were obtained:

RIGHT

ICA:  75/20 cm/sec

CCA:  47/8 cm/sec

SYSTOLIC ICA/CCA RATIO:

DIASTOLIC ICA/CCA RATIO:

ECA:  112 cm/sec peak systolic

LEFT

ICA:  83/26 cm/sec

CCA:  48/8 cm/sec

SYSTOLIC ICA/CCA RATIO:

DIASTOLIC ICA/CCA RATIO:

ECA:  108  cm/sec peak systolic

RIGHT CAROTID ARTERY: There is diffuse intimal thickening. There is
irregular heterogeneous echogenicity plaque throughout the
bifurcation and proximal right internal carotid artery. There are
areas of aliasing with a color flow suggesting hemodynamically
significant obstruction. By real-time interrogation, there appears
to be approximately 70% diameter stenosis in the proximal right
internal carotid artery.

RIGHT VERTEBRAL ARTERY:  Flow is antegrade.

LEFT CAROTID ARTERY: There is extensive plaque throughout the
bifurcation and proximal internal carotid artery. This plaque is
heterogeneous in echotexture. Areas of color flow a aliasing are
seen in this vessel. There are multiple areas of greater than 50%
diameter stenosis in the bifurcation and proximal internal carotid
artery. There is an area of that appears to represent approximately
85% diameter stenosis slightly beyond the bifurcation in the
proximal left internal carotid artery.

LEFT VERTEBRAL ARTERY:  Nonvisualized.
IMPRESSION: By real-time interrogation, there appears to be hemodynamically
significant obstruction in both proximal internal carotid arteries,
somewhat more severe on the left than on the right. Color flow
Doppler aliasing suggests that there is indeed hemodynamically
significant stenosis in these vessels. The peak systolic velocities
do not indicate obstruction of this degree; there reason for this
discrepancy is frankly uncertain. The

Flow in the right vertebral artery is in the anatomic direction. The
left vertebral artery could not be visualized.

Given the discrepancy between real-time interrogation an velocity
measurements as well as nonvisualization of the left vertebral
artery, correlation with CT or MR angiography of the extracranial
carotid and vertebral circulation is felt to be advisable.

## 2015-12-22 IMAGING — CR DG ABD PORTABLE 1V
1 series · 1 of 1 positions shown · non-contrast
Comparison: Upper GI series [DATE].

CLINICAL DATA: [AGE] male status post NG tube placement.
Initial encounter.

EXAM:
PORTABLE ABDOMEN - 1 VIEW

[ap (kub)]
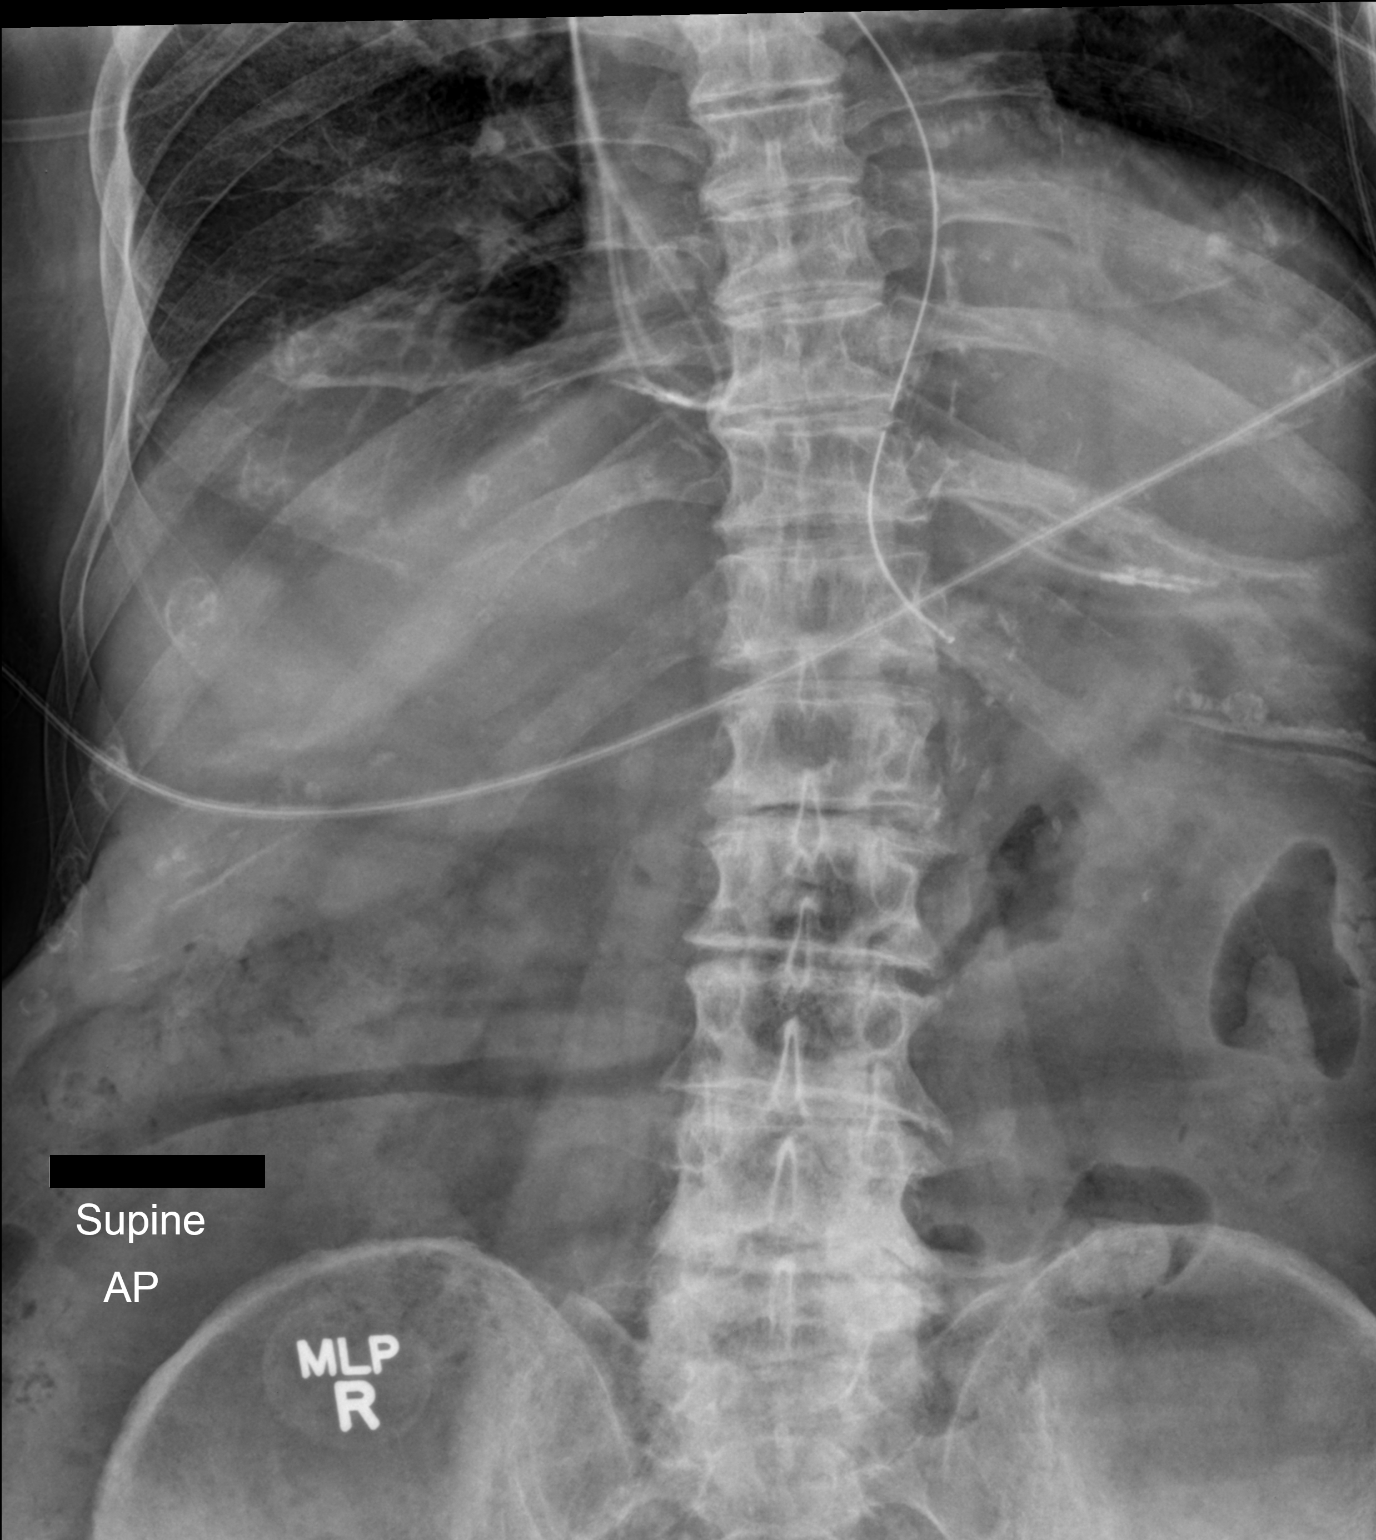

[1 of 1 positions shown; findings below may reference images not displayed]

FINDINGS: Portable AP supine view at 2801 hrs. Enteric tube in place, side
hole projects at the level of the distal thoracic esophagus or
gastroesophageal junction. The tip of the tube is at the level of
the gastric cardia. Visualized bowel gas pattern is non obstructed.
Cardiac pacemaker leads partially visible. Degenerative changes in
the spine.
IMPRESSION: NG tube tip at the proximal stomach. Advance 8 cm for more optimal
placement.
